# Patient Record
Sex: Female | Born: 1935 | Race: White | Hispanic: No | Marital: Married | Smoking: Never smoker
Health system: Southern US, Community
[De-identification: ages and names within clinical notes are randomized; demographics above are authoritative.]

## PROBLEM LIST (undated history)

## (undated) DIAGNOSIS — R011 Cardiac murmur, unspecified: Secondary | ICD-10-CM

## (undated) DIAGNOSIS — I129 Hypertensive chronic kidney disease with stage 1 through stage 4 chronic kidney disease, or unspecified chronic kidney disease: Secondary | ICD-10-CM

## (undated) DIAGNOSIS — Z9889 Other specified postprocedural states: Secondary | ICD-10-CM

## (undated) DIAGNOSIS — K5792 Diverticulitis of intestine, part unspecified, without perforation or abscess without bleeding: Secondary | ICD-10-CM

## (undated) DIAGNOSIS — N186 End stage renal disease: Secondary | ICD-10-CM

## (undated) DIAGNOSIS — N189 Chronic kidney disease, unspecified: Secondary | ICD-10-CM

## (undated) DIAGNOSIS — Z992 Dependence on renal dialysis: Secondary | ICD-10-CM

## (undated) DIAGNOSIS — M1990 Unspecified osteoarthritis, unspecified site: Secondary | ICD-10-CM

## (undated) DIAGNOSIS — E875 Hyperkalemia: Secondary | ICD-10-CM

## (undated) DIAGNOSIS — Q872 Congenital malformation syndromes predominantly involving limbs: Secondary | ICD-10-CM

## (undated) DIAGNOSIS — R112 Nausea with vomiting, unspecified: Secondary | ICD-10-CM

## (undated) DIAGNOSIS — I10 Essential (primary) hypertension: Secondary | ICD-10-CM

## (undated) DIAGNOSIS — H9190 Unspecified hearing loss, unspecified ear: Secondary | ICD-10-CM

## (undated) DIAGNOSIS — D631 Anemia in chronic kidney disease: Secondary | ICD-10-CM

## (undated) DIAGNOSIS — D1771 Benign lipomatous neoplasm of kidney: Secondary | ICD-10-CM

## (undated) DIAGNOSIS — K219 Gastro-esophageal reflux disease without esophagitis: Secondary | ICD-10-CM

## (undated) DIAGNOSIS — T4145XA Adverse effect of unspecified anesthetic, initial encounter: Secondary | ICD-10-CM

## (undated) DIAGNOSIS — R809 Proteinuria, unspecified: Secondary | ICD-10-CM

## (undated) DIAGNOSIS — IMO0001 Reserved for inherently not codable concepts without codable children: Secondary | ICD-10-CM

## (undated) DIAGNOSIS — E039 Hypothyroidism, unspecified: Secondary | ICD-10-CM

## (undated) HISTORY — DX: Proteinuria, unspecified: R80.9

## (undated) HISTORY — PX: ABDOMINAL HYSTERECTOMY: SHX81

## (undated) HISTORY — DX: Benign lipomatous neoplasm of kidney: D17.71

## (undated) HISTORY — DX: Other disorders of phosphorus metabolism: E83.39

## (undated) HISTORY — DX: Chronic kidney disease, unspecified: D63.1

## (undated) HISTORY — PX: CATARACT EXTRACTION W/ INTRAOCULAR LENS  IMPLANT, BILATERAL: SHX1307

## (undated) HISTORY — PX: COLONOSCOPY: SHX174

## (undated) HISTORY — DX: Chronic kidney disease, unspecified: N18.9

## (undated) HISTORY — PX: APPENDECTOMY: SHX54

## (undated) HISTORY — DX: Hyperkalemia: E87.5

## (undated) HISTORY — DX: Hypertensive chronic kidney disease with stage 1 through stage 4 chronic kidney disease, or unspecified chronic kidney disease: I12.9

## (undated) HISTORY — DX: Congenital malformation syndromes predominantly involving limbs: Q87.2

## (undated) HISTORY — PX: TONSILLECTOMY: SUR1361

---

## 1996-11-14 DIAGNOSIS — T8859XA Other complications of anesthesia, initial encounter: Secondary | ICD-10-CM

## 1996-11-14 HISTORY — DX: Other complications of anesthesia, initial encounter: T88.59XA

## 1996-11-14 HISTORY — PX: CHOLECYSTECTOMY: SHX55

## 2009-04-16 ENCOUNTER — Ambulatory Visit: Payer: Self-pay | Admitting: Urology

## 2009-05-01 ENCOUNTER — Ambulatory Visit: Payer: Self-pay | Admitting: Urology

## 2010-04-20 ENCOUNTER — Ambulatory Visit: Payer: Self-pay | Admitting: Urology

## 2010-05-06 ENCOUNTER — Ambulatory Visit: Payer: Self-pay | Admitting: General Practice

## 2013-01-20 DIAGNOSIS — Q872 Congenital malformation syndromes predominantly involving limbs: Secondary | ICD-10-CM | POA: Insufficient documentation

## 2015-02-04 DIAGNOSIS — E611 Iron deficiency: Secondary | ICD-10-CM | POA: Insufficient documentation

## 2015-02-26 DIAGNOSIS — Z01818 Encounter for other preprocedural examination: Secondary | ICD-10-CM | POA: Insufficient documentation

## 2015-08-06 ENCOUNTER — Encounter: Payer: Self-pay | Admitting: Vascular Surgery

## 2015-08-06 ENCOUNTER — Other Ambulatory Visit: Payer: Self-pay | Admitting: *Deleted

## 2015-08-06 DIAGNOSIS — N185 Chronic kidney disease, stage 5: Secondary | ICD-10-CM

## 2015-08-06 DIAGNOSIS — Z01810 Encounter for preprocedural cardiovascular examination: Secondary | ICD-10-CM

## 2015-08-07 ENCOUNTER — Ambulatory Visit (INDEPENDENT_AMBULATORY_CARE_PROVIDER_SITE_OTHER)
Admission: RE | Admit: 2015-08-07 | Discharge: 2015-08-07 | Disposition: A | Discharge status: Home / Self Care | Payer: PPO | Source: Ambulatory Visit | Attending: Vascular Surgery | Admitting: Vascular Surgery

## 2015-08-07 ENCOUNTER — Ambulatory Visit (HOSPITAL_COMMUNITY)
Admission: RE | Admit: 2015-08-07 | Discharge: 2015-08-07 | Disposition: A | Discharge status: Home / Self Care | Payer: PPO | Source: Ambulatory Visit | Attending: Vascular Surgery | Admitting: Vascular Surgery

## 2015-08-07 ENCOUNTER — Encounter: Payer: Self-pay | Admitting: Vascular Surgery

## 2015-08-07 ENCOUNTER — Other Ambulatory Visit: Payer: Self-pay

## 2015-08-07 ENCOUNTER — Encounter (HOSPITAL_COMMUNITY): Payer: Self-pay | Admitting: *Deleted

## 2015-08-07 ENCOUNTER — Ambulatory Visit (INDEPENDENT_AMBULATORY_CARE_PROVIDER_SITE_OTHER): Payer: PPO | Admitting: Vascular Surgery

## 2015-08-07 VITALS — BP 166/69 | HR 76 | Ht 60.0 in | Wt 118.0 lb

## 2015-08-07 DIAGNOSIS — N186 End stage renal disease: Secondary | ICD-10-CM | POA: Insufficient documentation

## 2015-08-07 DIAGNOSIS — Z01810 Encounter for preprocedural cardiovascular examination: Secondary | ICD-10-CM | POA: Diagnosis not present

## 2015-08-07 DIAGNOSIS — Z992 Dependence on renal dialysis: Secondary | ICD-10-CM

## 2015-08-07 DIAGNOSIS — N185 Chronic kidney disease, stage 5: Secondary | ICD-10-CM | POA: Insufficient documentation

## 2015-08-07 NOTE — Progress Notes (Signed)
Alexis Washington sounds winded as she talked on the phone.  I told her to stop anytime that it gets to be too much for her. Alexis Washington denies chest pain, does not see a cardiologist, stated that she may have had a ECHO at [place] years ago.  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  Alexis Washington reported that in 1998- she had cholesteatoma at [facility] and was told that her BP dropped low during surgery. After surgery ,blood pressure was elevated.  I requested records from [place].    Patient reports that she has not had an EKG in years and has not had a chest x-ray since surgery in 1998.

## 2015-08-07 NOTE — Progress Notes (Signed)
Referred by:  Dr. Justin Mend  Reason for referral: New access  History of Present Illness  Alexis Washington is a 81 y.o. (Mar 29, 1932) female who presents for evaluation for permanent access.  The patient is right hand dominant.  The patient has not had previous access procedures.  Previous central venous cannulation procedures include: none.  The patient has never had a PPM placed. The patient is now presenting with difficulty breathing and mildly altered mental status per family.  Dr. Justin Mend feels the patient now is entering ESRD and will need permanent access and [facility] placed.  He also gave her a Z-pack for possible pneumonia.  Past Medical History  Diagnosis Date  . Hyperkalemia   . Anemia in chronic renal disease   . Hypertensive kidney disease   . Chronic kidney disease, stage V   . Hyperphosphatemia   . Benign lipomatous neoplasm of kidney   . Nail-patella syndrome   . Proteinuria     Past Surgical History  Procedure Laterality Date  . Abdominal hysterectomy      Social History   Social History  . Marital Status: Married    Spouse Name: N/A  . Number of Children: N/A  . Years of Education: N/A   Occupational History  . Not on file.   Social History Main Topics  . Smoking status: Never Smoker   . Smokeless tobacco: Not on file  . Alcohol Use: No  . Drug Use: No  . Sexual Activity: Not on file   Other Topics Concern  . Not on file   Social History Narrative    Family History  Problem Relation Age of Onset  . Hypertension Mother   . Heart disease Father     before age 10  . Hyperlipidemia Father   . Hypertension Father   . Heart attack Father     Current Outpatient Prescriptions  Medication Sig Dispense Refill  . amLODipine (NORVASC) 2.5 MG tablet Take 2.5 mg by mouth daily.    . carvedilol (COREG) 12.5 MG tablet Take 12.5 mg by mouth 2 (two) times daily with a meal.    . Chlorpheniramine-Acetaminophen (CORICIDIN HBP COLD/FLU PO) Take by mouth daily as  needed.    . Cholecalciferol (VITAMIN D3 PO) Take 2,000 Units by mouth daily.    Marland Kitchen Epoetin Alfa (PROCRIT IJ) Inject as directed.    . furosemide (LASIX) 20 MG tablet Take 20 mg by mouth 2 (two) times daily.    Marland Kitchen levothyroxine (SYNTHROID, LEVOTHROID) 75 MCG tablet Take 75 mcg by mouth daily before breakfast.    . multivitamin (RENA-VIT) TABS tablet Take 6 tablets by mouth daily.    . ondansetron (ZOFRAN) 4 MG tablet Take 4 mg by mouth every 8 (eight) hours as needed for nausea or vomiting.    . pantoprazole (PROTONIX) 40 MG tablet Take 40 mg by mouth daily.    . Pepsin POWD by Does not apply route as needed.    Marland Kitchen azithromycin (ZITHROMAX) 250 MG tablet     . calcium acetate (PHOSLO) 667 MG capsule     . levothyroxine (SYNTHROID, LEVOTHROID) 100 MCG tablet     . PREMARIN vaginal cream     . PROCRIT 15400 UNIT/ML injection      No current facility-administered medications for this visit.    Allergies  Allergen Reactions  . Codeine Nausea Only  . Novocain [Procaine] Palpitations    REVIEW OF SYSTEMS:  (Positives checked otherwise negative)  CARDIOVASCULAR:   '[ ]'$   chest pain,  $Remo'[ ]'MVFeK$  chest pressure,  $RemoveBe'[ ]'zVtTVeShQ$  palpitations,  $RemoveBefore'[ ]'nktRUwCCpLPRc$  shortness of breath when laying flat,  $Remo'[x]'arxWT$  shortness of breath with exertion,   '[ ]'$  pain in feet when walking,  $RemoveB'[ ]'FFdgpfdB$  pain in feet when laying flat, $RemoveBefo'[ ]'PlmiPMeqgxt$  history of blood clot in veins (DVT),  $Remov'[ ]'TtFOxo$  history of phlebitis,  $RemoveBef'[x]'RVisEHCcTn$  swelling in legs,  $Remo'[ ]'wfSqA$  varicose veins  PULMONARY:   '[ ]'$  productive cough,  $Remov'[ ]'PrFNlk$  asthma,  $Remove'[ ]'XgiceUI$  wheezing  NEUROLOGIC:   '[x]'$  weakness in arms or legs,  $Remo'[ ]'YdjRJ$  numbness in arms or legs,  $Remo'[ ]'LYiwi$  difficulty speaking or slurred speech,  $Remove'[ ]'UZAWvHm$  temporary loss of vision in one eye,  $Rem'[x]'WIDI$  dizziness  HEMATOLOGIC:   '[ ]'$  bleeding problems,  $RemoveBe'[ ]'qJCBmFnUF$  problems with blood clotting too easily  MUSCULOSKEL:   '[ ]'$  joint pain, $RemoveBef'[ ]'IShnnKAajV$  joint swelling  GASTROINTEST:   '[ ]'$  vomiting blood,  $Remov'[ ]'irvzGa$  blood in stool     GENITOURINARY:   '[ ]'$  burning with urination,  $RemoveBef'[ ]'ZzaNBHyijU$  blood in  urine  PSYCHIATRIC:   '[ ]'$  history of major depression  INTEGUMENTARY:   '[ ]'$  rashes,  $Remove'[ ]'vzZVOLR$  ulcers  CONSTITUTIONAL:   '[ ]'$  fever,  $Remov'[ ]'PwDYEQ$  chills   Physical Examination  Filed Vitals:   08/07/15 1213 08/07/15 1222  BP: 220/63 166/69  Pulse: 76   Height: 5' (1.524 m)   Weight: 118 lb (53.524 kg)   SpO2: 100%    Body mass index is 23.05 kg/(m^2).  General: A&O x 3, WD, elderly, thin  Head: [place]/AT  Ear/Nose/Throat: Hearing grossly intact, nares w/o erythema or drainage, oropharynx w/o Erythema/Exudate, Mallampati score: 3  Eyes: PERRLA, EOMI  Neck: Supple, no nuchal rigidity, no palpable LAD  Pulmonary: Sym exp, good air movt, CTAB, no rales, rhonchi, & wheezing  Cardiac: RRR, Nl S1, S2, no Murmurs, rubs or gallops  Vascular: Vessel Right Left  Radial Palpable Palpable  Ulnar Not Palpable Not Palpable  Brachial  Palpable Palpable  Carotid Palpable, without bruit Palpable, without bruit  Aorta Not palpable N/A  Femoral Palpable Palpable  Popliteal Not palpable Not palpable  PT Palpable Palpable  DP Palpable Palpable   Gastrointestinal: soft, NTND, -G/R, - HSM, - masses, - CVAT B  Musculoskeletal: M/S 5/5 throughout , Extremities without ischemic changes , B elbow contractures limit extension to 90 degree  Neurologic: CN 2-12 intact , Pain and light touch intact in extremities , Motor exam as listed above  Psychiatric: Judgment intact, Mood & affect appropriate for pt's clinical situation  Dermatologic: See M/S exam for extremity exam, no rashes otherwise noted  Lymph : No Cervical, Axillary, or Inguinal lymphadenopathy    Non-Invasive Vascular Imaging  Vein Mapping  (Date: 08/07/2015):   R arm: acceptable vein conduits include marginal upper arm cephalic, marginal basilic vein  L arm: acceptable vein conduits include none  BUE Doppler (Date: 08/07/2015):   R arm:   Brachial: tri,4.0 mm  Radial: tri,1.5 mm  Ulnar: tri,1.6 mm  L arm:   Brachial:  tri,3.0 mm  Radial: tri,1.9 mm  Ulnar: tri,1.8 mm   Outside Studies/Documentation 6 pages of outside documents were reviewed including: outpatient nephrology chart and labs.   Medical Decision Making  Alexis Washington is a 81 y.o. female who presents with imminent ESRD requiring hemodialysis, recovering pneumonia   Based on vein mapping and examination, this patient's permanent access options include: R BC vs stage BVT vs AVG.  Her permanent access may be delayed by Anesthesia due to the pneumonia.  On exam, she her lungs sound clear today so clearly she is recovering.  I will place [name] on Monday regardless of the whether permanent access is permitted by Anesthesia.  I had an extensive discussion with this patient in regards to the nature of access surgery, including risk, benefits, and alternatives.    The patient is aware that the risks of access surgery include but are not limited to: bleeding, infection, steal syndrome, nerve damage, ischemic monomelic neuropathy, failure of access to mature, and possible need for additional access procedures in the future.  I discussed with the patient the nature of the staged access procedure, specifically the need for a second operation to transpose the first stage fistula if it matures adequately.   The patient is aware the risks of tunneled dialysis catheter placement include but are not limited to: bleeding, infection, central venous injury, pneumothorax, possible venous stenosis, possible malpositioning in the venous system, and possible infections related to long-term catheter presence.   The patient has agreed to proceed with the above procedure which will be scheduled this Monday 26 SEP 16.   Adele Barthel, MD [facility] Office: [phone] Pager: [phone]  08/07/2015, 12:59 PM

## 2015-08-07 NOTE — Progress Notes (Signed)
I informed Dr Marcie Bal of patients history and current ? Pneumonia.  Dr Marcie Bal said that they would evaluate patient on Monday.

## 2015-08-09 NOTE — Anesthesia Preprocedure Evaluation (Addendum)
Anesthesia Evaluation  Patient identified by MRN, date of birth, ID band Patient awake    Reviewed: Allergy & Precautions, NPO status , Patient's Chart, lab work & pertinent test results  History of Anesthesia Complications (+) PONV and history of anesthetic complications  Airway Mallampati: III  TM Distance: >3 FB Neck ROM: Full    Dental  (+) Dental Advisory Given   Pulmonary neg pulmonary ROS,    breath sounds clear to auscultation       Cardiovascular hypertension, Pt. on medications and Pt. on home beta blockers (-) angina Rhythm:Regular Rate:Normal     Neuro/Psych negative neurological ROS     GI/Hepatic Neg liver ROS, GERD  Medicated and Controlled,  Endo/Other  diabetes (glu 118)Hypothyroidism   Renal/GU ESRFRenal disease (K+ 4.4, no dialysis yet)     Musculoskeletal   Abdominal   Peds  Hematology  (+) Blood dyscrasia (Hb 8.8), ,   Anesthesia Other Findings   Reproductive/Obstetrics                            Anesthesia Physical Anesthesia Plan  ASA: III  Anesthesia Plan: MAC   Post-op Pain Management:    Induction: Intravenous  Airway Management Planned: Natural Airway and Simple Face Mask  Additional Equipment:   Intra-op Plan:   Post-operative Plan:   Informed Consent: I have reviewed the patients History and Physical, chart, labs and discussed the procedure including the risks, benefits and alternatives for the proposed anesthesia with the patient or authorized representative who has indicated his/her understanding and acceptance.   Dental advisory given  Plan Discussed with: CRNA and Surgeon  Anesthesia Plan Comments: (Plan routine monitors, MAC)        Anesthesia Quick Evaluation

## 2015-08-10 ENCOUNTER — Encounter (HOSPITAL_COMMUNITY): Payer: Self-pay | Admitting: *Deleted

## 2015-08-10 ENCOUNTER — Ambulatory Visit (HOSPITAL_COMMUNITY)
Admission: RE | Admit: 2015-08-10 | Discharge: 2015-08-10 | Disposition: A | Discharge status: Home / Self Care | Payer: PPO | Source: Ambulatory Visit | Attending: Vascular Surgery | Admitting: Vascular Surgery

## 2015-08-10 ENCOUNTER — Ambulatory Visit (HOSPITAL_COMMUNITY): Payer: PPO | Admitting: Anesthesiology

## 2015-08-10 ENCOUNTER — Ambulatory Visit (HOSPITAL_COMMUNITY): Payer: PPO

## 2015-08-10 ENCOUNTER — Encounter (HOSPITAL_COMMUNITY)
Admission: RE | Disposition: A | Discharge status: Home / Self Care | Payer: Self-pay | Source: Ambulatory Visit | Attending: Vascular Surgery

## 2015-08-10 DIAGNOSIS — E039 Hypothyroidism, unspecified: Secondary | ICD-10-CM | POA: Insufficient documentation

## 2015-08-10 DIAGNOSIS — Z992 Dependence on renal dialysis: Secondary | ICD-10-CM

## 2015-08-10 DIAGNOSIS — I120 Hypertensive chronic kidney disease with stage 5 chronic kidney disease or end stage renal disease: Secondary | ICD-10-CM | POA: Diagnosis not present

## 2015-08-10 DIAGNOSIS — Z79899 Other long term (current) drug therapy: Secondary | ICD-10-CM | POA: Insufficient documentation

## 2015-08-10 DIAGNOSIS — E1122 Type 2 diabetes mellitus with diabetic chronic kidney disease: Secondary | ICD-10-CM | POA: Diagnosis not present

## 2015-08-10 DIAGNOSIS — D631 Anemia in chronic kidney disease: Secondary | ICD-10-CM | POA: Insufficient documentation

## 2015-08-10 DIAGNOSIS — N185 Chronic kidney disease, stage 5: Secondary | ICD-10-CM | POA: Diagnosis not present

## 2015-08-10 DIAGNOSIS — N186 End stage renal disease: Secondary | ICD-10-CM | POA: Insufficient documentation

## 2015-08-10 DIAGNOSIS — Z95828 Presence of other vascular implants and grafts: Secondary | ICD-10-CM

## 2015-08-10 DIAGNOSIS — Z419 Encounter for procedure for purposes other than remedying health state, unspecified: Secondary | ICD-10-CM

## 2015-08-10 HISTORY — DX: Cardiac murmur, unspecified: R01.1

## 2015-08-10 HISTORY — PX: AV FISTULA PLACEMENT: SHX1204

## 2015-08-10 HISTORY — DX: Essential (primary) hypertension: I10

## 2015-08-10 HISTORY — DX: Reserved for inherently not codable concepts without codable children: IMO0001

## 2015-08-10 HISTORY — DX: Gastro-esophageal reflux disease without esophagitis: K21.9

## 2015-08-10 HISTORY — DX: Unspecified osteoarthritis, unspecified site: M19.90

## 2015-08-10 HISTORY — DX: Unspecified hearing loss, unspecified ear: H91.90

## 2015-08-10 HISTORY — DX: Hypothyroidism, unspecified: E03.9

## 2015-08-10 HISTORY — PX: FISTULA SUPERFICIALIZATION: SHX6341

## 2015-08-10 HISTORY — DX: Diverticulitis of intestine, part unspecified, without perforation or abscess without bleeding: K57.92

## 2015-08-10 HISTORY — DX: Adverse effect of unspecified anesthetic, initial encounter: T41.45XA

## 2015-08-10 HISTORY — PX: INSERTION OF DIALYSIS CATHETER: SHX1324

## 2015-08-10 LAB — POCT I-STAT 4, (NA,K, GLUC, HGB,HCT)
Glucose, Bld: 118 mg/dL — ABNORMAL HIGH (ref 65–99)
HEMATOCRIT: 26 % — AB (ref 36.0–46.0)
Hemoglobin: 8.8 g/dL — ABNORMAL LOW (ref 12.0–15.0)
Potassium: 4.4 mmol/L (ref 3.5–5.1)
SODIUM: 134 mmol/L — AB (ref 135–145)

## 2015-08-10 SURGERY — ARTERIOVENOUS (AV) FISTULA CREATION
Anesthesia: Monitor Anesthesia Care | Site: Neck | Laterality: Right

## 2015-08-10 MED ORDER — PROPOFOL 10 MG/ML IV BOLUS
INTRAVENOUS | Status: AC
  Filled 2015-08-10: qty 20

## 2015-08-10 MED ORDER — THROMBIN 20000 UNITS EX SOLR
CUTANEOUS | Status: AC
  Filled 2015-08-10: qty 20000

## 2015-08-10 MED ORDER — ONDANSETRON HCL 4 MG/2ML IJ SOLN
INTRAMUSCULAR | Status: AC
  Filled 2015-08-10: qty 2

## 2015-08-10 MED ORDER — CHLORHEXIDINE GLUCONATE CLOTH 2 % EX PADS: 6.0000 | MEDICATED_PAD | Freq: Once | CUTANEOUS | Status: DC

## 2015-08-10 MED ORDER — SODIUM CHLORIDE 0.9 % IV SOLN
INTRAVENOUS | Status: DC | PRN
  Administered 2015-08-10: 08:00:00
  Administered 2015-08-10: 500 mL

## 2015-08-10 MED ORDER — SODIUM CHLORIDE 0.9 % IV SOLN
INTRAVENOUS | Status: DC
  Administered 2015-08-10: 07:00:00 via INTRAVENOUS

## 2015-08-10 MED ORDER — HEPARIN SODIUM (PORCINE) 1000 UNIT/ML IJ SOLN
INTRAMUSCULAR | Status: DC | PRN
  Administered 2015-08-10: 1000 [IU]

## 2015-08-10 MED ORDER — 0.9 % SODIUM CHLORIDE (POUR BTL) OPTIME
TOPICAL | Status: DC | PRN
  Administered 2015-08-10: 1000 mL

## 2015-08-10 MED ORDER — LIDOCAINE HCL (PF) 1 % IJ SOLN
INTRAMUSCULAR | Status: DC | PRN
  Administered 2015-08-10 (×2): 30 mL

## 2015-08-10 MED ORDER — FENTANYL CITRATE (PF) 250 MCG/5ML IJ SOLN
INTRAMUSCULAR | Status: AC
  Filled 2015-08-10: qty 5

## 2015-08-10 MED ORDER — LIDOCAINE HCL (PF) 1 % IJ SOLN
INTRAMUSCULAR | Status: AC
  Filled 2015-08-10: qty 30

## 2015-08-10 MED ORDER — MIDAZOLAM HCL 2 MG/2ML IJ SOLN: 0.5000 mg | Freq: Once | INTRAMUSCULAR | Status: DC | PRN

## 2015-08-10 MED ORDER — MEPERIDINE HCL 25 MG/ML IJ SOLN: 6.2500 mg | INTRAMUSCULAR | Status: DC | PRN

## 2015-08-10 MED ORDER — ONDANSETRON HCL 4 MG/2ML IJ SOLN
INTRAMUSCULAR | Status: DC | PRN
  Administered 2015-08-10: 4 mg via INTRAVENOUS

## 2015-08-10 MED ORDER — FENTANYL CITRATE (PF) 100 MCG/2ML IJ SOLN
25.0000 ug | INTRAMUSCULAR | Status: DC | PRN
  Administered 2015-08-10: 25 ug via INTRAVENOUS

## 2015-08-10 MED ORDER — ONDANSETRON HCL 4 MG/2ML IJ SOLN
4.0000 mg | Freq: Once | INTRAMUSCULAR | Status: AC
  Administered 2015-08-10: 4 mg via INTRAVENOUS

## 2015-08-10 MED ORDER — PROPOFOL 10 MG/ML IV BOLUS
INTRAVENOUS | Status: DC | PRN
  Administered 2015-08-10 (×7): 20 mg via INTRAVENOUS

## 2015-08-10 MED ORDER — FENTANYL CITRATE (PF) 250 MCG/5ML IJ SOLN
INTRAMUSCULAR | Status: DC | PRN
  Administered 2015-08-10 (×3): 25 ug via INTRAVENOUS

## 2015-08-10 MED ORDER — CEFUROXIME SODIUM 1.5 G IJ SOLR
1.5000 g | INTRAMUSCULAR | Status: AC
  Administered 2015-08-10: 1.5 g via INTRAVENOUS
  Filled 2015-08-10: qty 1.5

## 2015-08-10 MED ORDER — LIDOCAINE-EPINEPHRINE (PF) 1 %-1:200000 IJ SOLN
INTRAMUSCULAR | Status: AC
  Filled 2015-08-10: qty 30

## 2015-08-10 MED ORDER — TRAMADOL HCL 50 MG PO TABS: 50.0000 mg | ORAL_TABLET | Freq: Four times a day (QID) | ORAL | Status: DC | PRN

## 2015-08-10 MED ORDER — HEPARIN SODIUM (PORCINE) 1000 UNIT/ML IJ SOLN
INTRAMUSCULAR | Status: AC
  Filled 2015-08-10: qty 1

## 2015-08-10 MED ORDER — PROMETHAZINE HCL 25 MG/ML IJ SOLN
6.2500 mg | INTRAMUSCULAR | Status: DC | PRN
  Administered 2015-08-10: 6.25 mg via INTRAVENOUS

## 2015-08-10 MED ORDER — THROMBIN 20000 UNITS EX SOLR
OROMUCOSAL | Status: DC | PRN
  Administered 2015-08-10: 20 mL via TOPICAL

## 2015-08-10 MED ORDER — PROMETHAZINE HCL 25 MG/ML IJ SOLN
INTRAMUSCULAR | Status: AC
  Administered 2015-08-10: 6.25 mg via INTRAVENOUS
  Filled 2015-08-10: qty 1

## 2015-08-10 MED ORDER — PROPOFOL 500 MG/50ML IV EMUL
INTRAVENOUS | Status: DC | PRN
  Administered 2015-08-10: 50 ug/kg/min via INTRAVENOUS

## 2015-08-10 MED ORDER — FENTANYL CITRATE (PF) 100 MCG/2ML IJ SOLN
INTRAMUSCULAR | Status: DC
Start: 2015-08-10 — End: 2015-08-10
  Filled 2015-08-10: qty 2

## 2015-08-10 SURGICAL SUPPLY — 64 items
ARMBAND PINK RESTRICT EXTREMIT (MISCELLANEOUS) ×4 IMPLANT
BAG DECANTER FOR FLEXI CONT (MISCELLANEOUS) ×4 IMPLANT
BIOPATCH RED 1 DISK 7.0 (GAUZE/BANDAGES/DRESSINGS) ×3 IMPLANT
BIOPATCH RED 1IN DISK 7.0MM (GAUZE/BANDAGES/DRESSINGS) ×1
CANISTER SUCTION 2500CC (MISCELLANEOUS) ×4 IMPLANT
CATH CANNON HEMO 15F 50CM (CATHETERS) IMPLANT
CATH CANNON HEMO 15FR 19 (HEMODIALYSIS SUPPLIES) IMPLANT
CATH CANNON HEMO 15FR 23CM (HEMODIALYSIS SUPPLIES) ×4 IMPLANT
CATH CANNON HEMO 15FR 31CM (HEMODIALYSIS SUPPLIES) IMPLANT
CATH CANNON HEMO 15FR 32CM (HEMODIALYSIS SUPPLIES) IMPLANT
CATH STRAIGHT 5FR 65CM (CATHETERS) IMPLANT
CLIP TI MEDIUM 6 (CLIP) ×4 IMPLANT
CLIP TI WIDE RED SMALL 6 (CLIP) ×4 IMPLANT
COVER PROBE W GEL 5X96 (DRAPES) ×8 IMPLANT
DECANTER SPIKE VIAL GLASS SM (MISCELLANEOUS) ×4 IMPLANT
DRAIN PENROSE 1/4X12 LTX STRL (WOUND CARE) ×4 IMPLANT
DRAPE C-ARM 42X72 X-RAY (DRAPES) ×4 IMPLANT
DRAPE CHEST BREAST 15X10 FENES (DRAPES) ×4 IMPLANT
ELECT REM PT RETURN 9FT ADLT (ELECTROSURGICAL) ×4
ELECTRODE REM PT RTRN 9FT ADLT (ELECTROSURGICAL) ×2 IMPLANT
GAUZE SPONGE 2X2 8PLY STRL LF (GAUZE/BANDAGES/DRESSINGS) ×2 IMPLANT
GAUZE SPONGE 4X4 16PLY XRAY LF (GAUZE/BANDAGES/DRESSINGS) ×4 IMPLANT
GLOVE BIO SURGEON STRL SZ 6.5 (GLOVE) ×3 IMPLANT
GLOVE BIO SURGEON STRL SZ7 (GLOVE) ×12 IMPLANT
GLOVE BIO SURGEON STRL SZ8.5 (GLOVE) ×4 IMPLANT
GLOVE BIO SURGEONS STRL SZ 6.5 (GLOVE) ×1
GLOVE BIOGEL PI IND STRL 6.5 (GLOVE) ×2 IMPLANT
GLOVE BIOGEL PI IND STRL 7.5 (GLOVE) ×2 IMPLANT
GLOVE BIOGEL PI INDICATOR 6.5 (GLOVE) ×2
GLOVE BIOGEL PI INDICATOR 7.5 (GLOVE) ×2
GLOVE ECLIPSE 7.5 STRL STRAW (GLOVE) ×12 IMPLANT
GOWN BRE IMP SLV AUR XL STRL (GOWN DISPOSABLE) ×8 IMPLANT
GOWN STRL REUS W/ TWL LRG LVL3 (GOWN DISPOSABLE) ×6 IMPLANT
GOWN STRL REUS W/TWL LRG LVL3 (GOWN DISPOSABLE) ×6
KIT BASIN OR (CUSTOM PROCEDURE TRAY) ×4 IMPLANT
KIT ROOM TURNOVER OR (KITS) ×4 IMPLANT
LIQUID BAND (GAUZE/BANDAGES/DRESSINGS) ×8 IMPLANT
NEEDLE 18GX1X1/2 (RX/OR ONLY) (NEEDLE) ×4 IMPLANT
NEEDLE HYPO 25GX1X1/2 BEV (NEEDLE) ×4 IMPLANT
NS IRRIG 1000ML POUR BTL (IV SOLUTION) ×4 IMPLANT
PACK CV ACCESS (CUSTOM PROCEDURE TRAY) ×4 IMPLANT
PACK SURGICAL SETUP 50X90 (CUSTOM PROCEDURE TRAY) IMPLANT
PAD ARMBOARD 7.5X6 YLW CONV (MISCELLANEOUS) ×8 IMPLANT
SET MICROPUNCTURE 5F STIFF (MISCELLANEOUS) ×4 IMPLANT
SOAP 2 % CHG 4 OZ (WOUND CARE) ×4 IMPLANT
SPONGE GAUZE 2X2 STER 10/PKG (GAUZE/BANDAGES/DRESSINGS) ×2
SPONGE SURGIFOAM ABS GEL 100 (HEMOSTASIS) ×4 IMPLANT
SUT ETHILON 3 0 PS 1 (SUTURE) ×4 IMPLANT
SUT MNCRL AB 4-0 PS2 18 (SUTURE) ×8 IMPLANT
SUT PROLENE 6 0 BV (SUTURE) IMPLANT
SUT PROLENE 7 0 BV 1 (SUTURE) ×12 IMPLANT
SUT SILK 4 0 (SUTURE) ×2
SUT SILK 4-0 18XBRD TIE 12 (SUTURE) ×2 IMPLANT
SUT VIC AB 3-0 SH 27 (SUTURE) ×2
SUT VIC AB 3-0 SH 27X BRD (SUTURE) ×2 IMPLANT
SYR 20CC LL (SYRINGE) ×8 IMPLANT
SYR 3ML LL SCALE MARK (SYRINGE) ×4 IMPLANT
SYR 5ML LL (SYRINGE) ×4 IMPLANT
SYR CONTROL 10ML LL (SYRINGE) ×4 IMPLANT
SYRINGE 10CC LL (SYRINGE) ×4 IMPLANT
TAPE CLOTH SURG 4X10 WHT LF (GAUZE/BANDAGES/DRESSINGS) ×4 IMPLANT
UNDERPAD 30X30 INCONTINENT (UNDERPADS AND DIAPERS) ×4 IMPLANT
WATER STERILE IRR 1000ML POUR (IV SOLUTION) ×4 IMPLANT
WIRE AMPLATZ SS-J .035X180CM (WIRE) IMPLANT

## 2015-08-10 NOTE — Anesthesia Postprocedure Evaluation (Signed)
  Anesthesia Post-op Note  Patient: Alexis Washington  Procedure(s) Performed: Procedure(s): Right Arm Arteriovenous Brachio-cephalic Fistula  (Right) INSERTION OF Right Internal Jugular DIALYSIS CATHETER (N/A) Right First Stage Brachial Transposition (Right)  Patient Location: PACU  Anesthesia Type:MAC  Level of Consciousness: awake, alert , oriented and patient cooperative  Airway and Oxygen Therapy: Patient Spontanous Breathing  Post-op Pain: none  Post-op Assessment: Post-op Vital signs reviewed, Patient's Cardiovascular Status Stable, Respiratory Function Stable, Patent Airway and Pain level controlled, nausea improved              Post-op Vital Signs: Reviewed and stable  Last Vitals:  Filed Vitals:   08/10/15 1125  BP: 178/68  Pulse: 70  Temp:   Resp: 17    Complications: No apparent anesthesia complications

## 2015-08-10 NOTE — Interval H&P Note (Signed)
History and Physical Interval Note:  08/10/2015 7:05 AM  Alexis Washington  has presented today for surgery, with the diagnosis of End Stage Renal Disease N18.6  The various methods of treatment have been discussed with the patient and family. After consideration of risks, benefits and other options for treatment, the patient has consented to  Procedure(s): ARTERIOVENOUS (AV) FISTULA CREATION VERSUS GRAFT INSERTION (Right) INSERTION OF DIALYSIS CATHETER (N/A) as a surgical intervention .  The patient's history has been reviewed, patient examined, no change in status, stable for surgery.  I have reviewed the patient's chart and labs.  Questions were answered to the patient's satisfaction.     Adele Barthel

## 2015-08-10 NOTE — Op Note (Addendum)
OPERATIVE NOTE  PROCEDURE: 1.  Right internal jugular vein tunneled dialysis catheter placement 2.  Right internal jugular vein cannulation under ultrasound guidance 3.  Right brachiocephalic arteriovenous fistula   4.  Right first stage brachial vein transposition   PRE-OPERATIVE DIAGNOSIS: end-stage renal failure  POST-OPERATIVE DIAGNOSIS: same as above  SURGEON: Adele Barthel, MD  ASSISTANT: Silva Bandy, PAC   ANESTHESIA: local and MAC  ESTIMATED BLOOD LOSS: 30 cc  FINDING(S): 1.  Tips of the catheter in the right atrium on fluoroscopy 2.  No obvious pneumothorax on fluoroscopy 3.  Failed brachiocephalic arteriovenous fistula due to proximal cephalic vein occlusion 4.  Palpable thrill in brachial vein transposition at end of case 5.  Palpable right radial pulse  SPECIMEN(S):  none  INDICATIONS:   Alexis Washington is a 81 y.o. female who presents with end stage renal disease.  The patient presents for tunneled dialysis catheter placement.  The patient is aware the risks of tunneled dialysis catheter placement include but are not limited to: bleeding, infection, central venous injury, pneumothorax, possible venous stenosis, possible malpositioning in the venous system, and possible infections related to long-term catheter presence.  The patient was aware of these risks and agreed to proceed.  DESCRIPTION: After written full informed consent was obtained from the patient, the patient was taken back to the operating room.  Prior to induction, the patient was given IV antibiotics.  After obtaining adequate sedation, the patient was prepped and draped in the standard fashion for a chest or neck tunneled dialysis catheter placement.   The cannulation site, the catheter exit site, and tract for the subcutaneous tunnel were then anesthestized with a total of 20 cc 1% Lidocaine without epinepherine.  Under ultrasound guidance, the right internal jugular vein was cannulated with the 18 gauge  needle.  A J-wire was then placed down into the inferior vena cava under fluoroscopic guidance.  The wire was then secured in place with a clamp to the drapes.  I then made stab incisions at the neck and exit sites.   I dissected from the exit site to the cannulation site with a metal tunneler.   The subcutaneous tunnel was dilated by passing a plastic dilator over the metal dissector. The wire was then unclamped and I removed the needle.  The skin tract and venotomy was dilated serially with dilators.  Finally, the dilator-sheath was placed under fluoroscopic guidance into the superior vena cava.  The dilator and wire were removed.  A 23 cm Diatek catheter was placed under fluoroscopic guidance down into the right atrium.  The sheath was broken and peeled away while holding the catheter cuff at the level of the skin.  The back end of this catheter was transected, revealing the two lumens of this catheter.  The ports were docked onto these two lumens.  The catheter hub was then screwed into place.  Each port was tested by aspirating and flushing.  No resistance was noted.  Each port was then thoroughly flushed with heparinized saline.  The catheter was secured in placed with two interrupted stitches of 3-0 Nylon tied to the catheter.  The neck incision was closed with a U-stitch of 4-0 Monocryl.  The neck and chest incision were cleaned and sterile bandages applied.  Each port was then loaded with concentrated heparin (1000 Units/mL) at the manufacturer recommended volumes to each port.  Sterile caps were applied to each port.  On completion fluoroscopy, the tips of the catheter were  in the right atrium, and there was no evidence of pneumothorax.  At this point, the drapes were taken down.  The patient was repositioned for a right arm access procedure and reprepped and redraped.   The patient is chronic contracted, so I had to pad the arm to limit the tension on her elbow.  I turned my attention first to  identifying the patient's cephalic vein and brachial artery.  Using SonoSite guidance, the location of these vessels were marked out on the skin.   At this point, I injected local anesthetic to obtain a field block of the antecubitum.  I injected another 5 mL of a 1% lidocaine without epinephrine.  I made a transverse incision at the level of the antecubitum and dissected through the subcutaneous tissue and fascia to gain exposure of the brachial artery.  This was noted to be 2.5 mm in diameter externally.  This was dissected out proximally and distally and controlled with vessel loops .  I then dissected out the cephalic vein.  This was noted to be 3 mm in diameter externally.  The distal segment of the vein was ligated with a  2-0 silk, and the vein was transected.  The proximal segment was interrogated with serial dilators.  The vein accepted up to a 4 mm dilator without any difficulty.  I then instilled the heparinized saline into the vein and clamped it.  At this point, I reset my exposure of the brachial artery and placed the artery under tension proximally and distally.  I made an arteriotomy with a #11 blade, and then I extended the arteriotomy with a Potts scissor.  I injected heparinized saline proximal and distal to this arteriotomy.  The vein was then sewn to the artery in an end-to-side configuration with a running stitch of 7-0 Prolene.  Prior to completing this anastomosis, I allowed the vein and artery to backbleed.  There was no evidence of clot from any vessels.  I completed the anastomosis in the usual fashion and then released all vessel loops and clamps.  There was no palpable  thrill in the venous outflow rather there was a strong pulse.  Doppler signals confirmed this findings, suggesting this cephalic vein is occluded proximally.  At felt that attempting another fistula was indicated given this patient's end stage renal status.  I turned my attention first to identifying the patient's  brachial veins.  I then dissected out the medial brachial vein.  This was noted to be 3 mm in diameter externally.  The distal segment of the vein had multiple side branches.  I carefully ligated multiple side branches until I had a dominant single vein.  The distal vein was ligated with a  2-0 silk, and the vein was transected.  The proximal segment was interrogated with serial dilators.  The vein accepted up to a 3.5 mm dilator without any difficulty.  I then instilled the heparinized saline into the vein and clamped it.  At this point, I reset my exposure of the brachial artery and placed the artery under tension proximally and distally.  The artery was noted to be fragile from the brachiocephalic arteriovenous fistula anastomosis, so I felt taking down the prior anastomosis might result in irreparable damage to the artery.  I tied off the distal cephalic vein and then cut the fistula ~5 mm distal to the anastomosis, leaving a vein cuff on the prior anastomosis.  The brachial vein was then sewn to the vein cuff on the brachial  artery in an end-to-end configuration with a running stitch of 7-0 Prolene.  Prior to completing this anastomosis, I allowed the vein and artery to backbleed.  There was no evidence of clot from any vessels.  I completed the anastomosis in the usual fashion and then released all vessel loops and clamps.  There was a palpable thrill in the venous outflow, and there was a palpable radial pulse.  At this point, I irrigated out the surgical wound.  There was no further active bleeding.  The subcutaneous tissue was reapproximated with a running stitch of 3-0 Vicryl.  The skin was then reapproximated with a running subcuticular stitch of 4-0 Vicryl.  The skin was then cleaned, dried, and reinforced with Dermabond.  The patient tolerated this procedure well.    COMPLICATIONS: none  CONDITION: stable   Adele Barthel, MD [facility] [place] Office: [phone] Pager:  [phone]  08/10/2015, 8:14 AM

## 2015-08-10 NOTE — Progress Notes (Signed)
Pt arrived to phase 2 and started to feel nauseated and began dry heaving.  Dr.Jackson notified and  Ok with giving second dose of Zofran, will cont to assess patients nausea level.

## 2015-08-10 NOTE — H&P (View-Only) (Signed)
Referred by:  Dr. Justin Mend  Reason for referral: New access  History of Present Illness  Alexis Washington is a 81 y.o. (02/03/1932) female who presents for evaluation for permanent access.  The patient is right hand dominant.  The patient has not had previous access procedures.  Previous central venous cannulation procedures include: none.  The patient has never had a PPM placed. The patient is now presenting with difficulty breathing and mildly altered mental status per family.  Dr. Justin Mend feels the patient now is entering ESRD and will need permanent access and [facility] placed.  He also gave her a Z-pack for possible pneumonia.  Past Medical History  Diagnosis Date  . Hyperkalemia   . Anemia in chronic renal disease   . Hypertensive kidney disease   . Chronic kidney disease, stage V   . Hyperphosphatemia   . Benign lipomatous neoplasm of kidney   . Nail-patella syndrome   . Proteinuria     Past Surgical History  Procedure Laterality Date  . Abdominal hysterectomy      Social History   Social History  . Marital Status: Married    Spouse Name: N/A  . Number of Children: N/A  . Years of Education: N/A   Occupational History  . Not on file.   Social History Main Topics  . Smoking status: Never Smoker   . Smokeless tobacco: Not on file  . Alcohol Use: No  . Drug Use: No  . Sexual Activity: Not on file   Other Topics Concern  . Not on file   Social History Narrative    Family History  Problem Relation Age of Onset  . Hypertension Mother   . Heart disease Father     before age 95  . Hyperlipidemia Father   . Hypertension Father   . Heart attack Father     Current Outpatient Prescriptions  Medication Sig Dispense Refill  . amLODipine (NORVASC) 2.5 MG tablet Take 2.5 mg by mouth daily.    . carvedilol (COREG) 12.5 MG tablet Take 12.5 mg by mouth 2 (two) times daily with a meal.    . Chlorpheniramine-Acetaminophen (CORICIDIN HBP COLD/FLU PO) Take by mouth daily as  needed.    . Cholecalciferol (VITAMIN D3 PO) Take 2,000 Units by mouth daily.    Marland Kitchen Epoetin Alfa (PROCRIT IJ) Inject as directed.    . furosemide (LASIX) 20 MG tablet Take 20 mg by mouth 2 (two) times daily.    Marland Kitchen levothyroxine (SYNTHROID, LEVOTHROID) 75 MCG tablet Take 75 mcg by mouth daily before breakfast.    . multivitamin (RENA-VIT) TABS tablet Take 6 tablets by mouth daily.    . ondansetron (ZOFRAN) 4 MG tablet Take 4 mg by mouth every 8 (eight) hours as needed for nausea or vomiting.    . pantoprazole (PROTONIX) 40 MG tablet Take 40 mg by mouth daily.    . Pepsin POWD by Does not apply route as needed.    Marland Kitchen azithromycin (ZITHROMAX) 250 MG tablet     . calcium acetate (PHOSLO) 667 MG capsule     . levothyroxine (SYNTHROID, LEVOTHROID) 100 MCG tablet     . PREMARIN vaginal cream     . PROCRIT 59935 UNIT/ML injection      No current facility-administered medications for this visit.    Allergies  Allergen Reactions  . Codeine Nausea Only  . Novocain [Procaine] Palpitations    REVIEW OF SYSTEMS:  (Positives checked otherwise negative)  CARDIOVASCULAR:   '[ ]'$   chest pain,  $Remo'[ ]'WkICG$  chest pressure,  $RemoveBe'[ ]'xdobRSrrs$  palpitations,  $RemoveBefore'[ ]'EqgQeiMQwmwGU$  shortness of breath when laying flat,  $Remo'[x]'UFykE$  shortness of breath with exertion,   '[ ]'$  pain in feet when walking,  $RemoveB'[ ]'TgYMHdaV$  pain in feet when laying flat, $RemoveBefo'[ ]'WFcFjDAniXn$  history of blood clot in veins (DVT),  $Remov'[ ]'Vcdfrd$  history of phlebitis,  $RemoveBef'[x]'QVsGgLyjeJ$  swelling in legs,  $Remo'[ ]'Gtnuj$  varicose veins  PULMONARY:   '[ ]'$  productive cough,  $Remov'[ ]'tkmPlK$  asthma,  $Remove'[ ]'ysbwelD$  wheezing  NEUROLOGIC:   '[x]'$  weakness in arms or legs,  $Remo'[ ]'xTHtI$  numbness in arms or legs,  $Remo'[ ]'VKoFG$  difficulty speaking or slurred speech,  $Remove'[ ]'qtMUuAw$  temporary loss of vision in one eye,  $Rem'[x]'TGgF$  dizziness  HEMATOLOGIC:   '[ ]'$  bleeding problems,  $RemoveBe'[ ]'JdEfEgSap$  problems with blood clotting too easily  MUSCULOSKEL:   '[ ]'$  joint pain, $RemoveBef'[ ]'wZDdvkfYiE$  joint swelling  GASTROINTEST:   '[ ]'$  vomiting blood,  $Remov'[ ]'WuDHYj$  blood in stool     GENITOURINARY:   '[ ]'$  burning with urination,  $RemoveBef'[ ]'spsMFrhxWS$  blood in  urine  PSYCHIATRIC:   '[ ]'$  history of major depression  INTEGUMENTARY:   '[ ]'$  rashes,  $Remove'[ ]'peBVBCM$  ulcers  CONSTITUTIONAL:   '[ ]'$  fever,  $Remov'[ ]'pQRmxP$  chills   Physical Examination  Filed Vitals:   08/07/15 1213 08/07/15 1222  BP: 220/63 166/69  Pulse: 76   Height: 5' (1.524 m)   Weight: 118 lb (53.524 kg)   SpO2: 100%    Body mass index is 23.05 kg/(m^2).  General: A&O x 3, WD, elderly, thin  Head: [place]/AT  Ear/Nose/Throat: Hearing grossly intact, nares w/o erythema or drainage, oropharynx w/o Erythema/Exudate, Mallampati score: 3  Eyes: PERRLA, EOMI  Neck: Supple, no nuchal rigidity, no palpable LAD  Pulmonary: Sym exp, good air movt, CTAB, no rales, rhonchi, & wheezing  Cardiac: RRR, Nl S1, S2, no Murmurs, rubs or gallops  Vascular: Vessel Right Left  Radial Palpable Palpable  Ulnar Not Palpable Not Palpable  Brachial  Palpable Palpable  Carotid Palpable, without bruit Palpable, without bruit  Aorta Not palpable N/A  Femoral Palpable Palpable  Popliteal Not palpable Not palpable  PT Palpable Palpable  DP Palpable Palpable   Gastrointestinal: soft, NTND, -G/R, - HSM, - masses, - CVAT B  Musculoskeletal: M/S 5/5 throughout , Extremities without ischemic changes , B elbow contractures limit extension to 90 degree  Neurologic: CN 2-12 intact , Pain and light touch intact in extremities , Motor exam as listed above  Psychiatric: Judgment intact, Mood & affect appropriate for pt's clinical situation  Dermatologic: See M/S exam for extremity exam, no rashes otherwise noted  Lymph : No Cervical, Axillary, or Inguinal lymphadenopathy    Non-Invasive Vascular Imaging  Vein Mapping  (Date: 08/07/2015):   R arm: acceptable vein conduits include marginal upper arm cephalic, marginal basilic vein  L arm: acceptable vein conduits include none  BUE Doppler (Date: 08/07/2015):   R arm:   Brachial: tri,4.0 mm  Radial: tri,1.5 mm  Ulnar: tri,1.6 mm  L arm:   Brachial:  tri,3.0 mm  Radial: tri,1.9 mm  Ulnar: tri,1.8 mm   Outside Studies/Documentation 6 pages of outside documents were reviewed including: outpatient nephrology chart and labs.   Medical Decision Making  Alexis Washington is a 81 y.o. female who presents with imminent ESRD requiring hemodialysis, recovering pneumonia   Based on vein mapping and examination, this patient's permanent access options include: R BC vs stage BVT vs AVG.  Her permanent access may be delayed by Anesthesia due to the pneumonia.  On exam, she her lungs sound clear today so clearly she is recovering.  I will place [facility] on Monday regardless of the whether permanent access is permitted by Anesthesia.  I had an extensive discussion with this patient in regards to the nature of access surgery, including risk, benefits, and alternatives.    The patient is aware that the risks of access surgery include but are not limited to: bleeding, infection, steal syndrome, nerve damage, ischemic monomelic neuropathy, failure of access to mature, and possible need for additional access procedures in the future.  I discussed with the patient the nature of the staged access procedure, specifically the need for a second operation to transpose the first stage fistula if it matures adequately.   The patient is aware the risks of tunneled dialysis catheter placement include but are not limited to: bleeding, infection, central venous injury, pneumothorax, possible venous stenosis, possible malpositioning in the venous system, and possible infections related to long-term catheter presence.   The patient has agreed to proceed with the above procedure which will be scheduled this Monday 26 SEP 16.   Adele Barthel, MD [facility] [place] Office: [phone] Pager: [phone]  08/07/2015, 12:59 PM

## 2015-08-10 NOTE — Transfer of Care (Signed)
Immediate Anesthesia Transfer of Care Note  Patient: Alexis Washington  Procedure(s) Performed: Procedure(s): Right Arm Arteriovenous Brachio-cephalic Fistula  (Right) INSERTION OF Right Internal Jugular DIALYSIS CATHETER (N/A) Right First Stage Brachial Transposition (Right)  Patient Location: PACU  Anesthesia Type:MAC  Level of Consciousness: awake, alert  and oriented  Airway & Oxygen Therapy: Patient Spontanous Breathing and Patient connected to nasal cannula oxygen  Post-op Assessment: Report given to RN and Post -op Vital signs reviewed and stable  Post vital signs: Reviewed and stable  Last Vitals:  Filed Vitals:   08/10/15 1026  BP: 147/66  Pulse:   Temp: 36.4 C  Resp:     Complications: No apparent anesthesia complications

## 2015-08-10 NOTE — Discharge Instructions (Signed)
° ° °  08/10/2015 Alexis Washington [id number] 1932/01/24  Surgeon(s): Conrad [place], MD  Procedure(s): Right Arm Arteriovenous Brachio-cephalic Fistula  INSERTION OF Right Internal Jugular DIALYSIS CATHETER Right First Stage Brachial Transposition  x Do not stick graft for 12 weeks

## 2015-08-11 ENCOUNTER — Encounter (HOSPITAL_COMMUNITY): Payer: Self-pay | Admitting: Vascular Surgery

## 2015-08-12 ENCOUNTER — Telehealth: Payer: Self-pay | Admitting: Vascular Surgery

## 2015-08-12 NOTE — Telephone Encounter (Signed)
-----   Message from Mena Goes, RN sent at 08/10/2015 10:21 AM EDT ----- Regarding: Schedule   ----- Message -----    From: Alvia Grove, PA-C    Sent: 08/10/2015  10:19 AM      To: Vvs Charge Pool  S/p right brachicephalic AVF and right 1st stage Brachial vein transposition 08/10/15  F/u in 6 weeks with Dr. Bridgett Larsson. No duplex studies.   Thanks Maudie Mercury

## 2015-08-12 NOTE — Telephone Encounter (Signed)
Spoke wth pt, dpm

## 2015-09-09 ENCOUNTER — Encounter: Payer: Self-pay | Admitting: Nephrology

## 2015-09-22 ENCOUNTER — Encounter: Payer: Self-pay | Admitting: Vascular Surgery

## 2015-09-25 ENCOUNTER — Ambulatory Visit (INDEPENDENT_AMBULATORY_CARE_PROVIDER_SITE_OTHER): Payer: PPO | Admitting: Vascular Surgery

## 2015-09-25 ENCOUNTER — Encounter: Payer: Self-pay | Admitting: Vascular Surgery

## 2015-09-25 VITALS — BP 175/74 | HR 65 | Temp 98.0°F | Resp 16 | Ht 60.0 in | Wt 121.0 lb

## 2015-09-25 DIAGNOSIS — Z992 Dependence on renal dialysis: Secondary | ICD-10-CM

## 2015-09-25 DIAGNOSIS — N186 End stage renal disease: Secondary | ICD-10-CM

## 2015-09-25 NOTE — Progress Notes (Signed)
    Postoperative Access Visit   History of Present Illness  Alexis Washington is a 81 y.o. year old female who presents for postoperative follow-up for: RIJV TDC, R BC AVF, R 1st stage BRVT (Date: 08/10/15).  At the end of the [facility] AVF, there was only a pulse in the fistula, suggesting proximal cephalic vein occlusion.  The fistula was takedown and immediately a R 1st stage BRVT completed.  The patient's wounds are healed.  The patient notes no steal symptoms.  The patient is able to complete their activities of daily living.  The patient's current symptoms are: none.  For VQI Use Only  PRE-ADM LIVING: Home  AMB STATUS: Ambulatory  Physical Examination Filed Vitals:   09/25/15 1130  BP: 175/74  Pulse: 65  Temp:   Resp:     RUE: Incision is healed, skin feels warm, hand grip is 5/5, sensation in digits is intact, palpable thrill, bruit can be auscultated, on Sonosite: distally ~5-5.5 mm, proximal >6 mm, bilateral elbow contractures noted  Medical Decision Making  Alexis Washington is a 81 y.o. year old female who presents s/p RIJV TDC, R BC AVF ligation.  I would give the patient another month.  I suspect the fistula will be ready for transposition in one month.  She will follow up with me one month.  Thank you for allowing [place] to participate in this patient's care.  Adele Barthel, MD [facility] [place] Office: [phone] Pager: [phone]  09/25/2015, 12:05 PM

## 2015-10-16 ENCOUNTER — Encounter: Payer: Self-pay | Admitting: Vascular Surgery

## 2015-10-23 ENCOUNTER — Encounter: Payer: Self-pay | Admitting: Vascular Surgery

## 2015-10-23 ENCOUNTER — Ambulatory Visit: Payer: PPO | Admitting: Vascular Surgery

## 2015-10-30 ENCOUNTER — Ambulatory Visit (INDEPENDENT_AMBULATORY_CARE_PROVIDER_SITE_OTHER): Payer: PPO | Admitting: Vascular Surgery

## 2015-10-30 ENCOUNTER — Encounter: Payer: Self-pay | Admitting: Vascular Surgery

## 2015-10-30 VITALS — BP 155/66 | HR 87 | Temp 97.3°F | Resp 16 | Ht 60.0 in | Wt 120.0 lb

## 2015-10-30 DIAGNOSIS — N186 End stage renal disease: Secondary | ICD-10-CM

## 2015-10-30 DIAGNOSIS — Z992 Dependence on renal dialysis: Secondary | ICD-10-CM

## 2015-10-30 NOTE — Progress Notes (Signed)
Postoperative Access Visit   History of Present Illness  Alexis Washington is a 81 y.o. (Jul 06, 1932) female   who presents for postoperative follow-up for: RIJV TDC, R BC AVF, R 1st stage BRVT (Date: 08/10/15). Pt has been asx since the surgery.  She return for check on the fistula  Past Medical History  Diagnosis Date  . Hyperkalemia   . Hyperphosphatemia   . Nail-patella syndrome   . Proteinuria   . Hypertension   . Shortness of breath dyspnea   . Heart murmur     PCP- said it is nothing to be concerned  . Hypothyroidism   . Anemia in chronic renal disease     ESRD- Sees Dr Justin Mend  . Hypertensive kidney disease   . Chronic kidney disease, stage V ([place])   . Benign lipomatous neoplasm of kidney   . GERD (gastroesophageal reflux disease)   . Diverticulitis   . Complication of anesthesia 1998    "blood pressure dropped low during surgery and after surgery it went up really high."   . Arthritis   . HOH (hard of hearing)     Past Surgical History  Procedure Laterality Date  . Abdominal hysterectomy    . Cholecystectomy  1998  . Colonoscopy    . Av fistula placement Right 08/10/2015    Procedure: Right Arm Arteriovenous Brachio-cephalic Fistula ;  Surgeon: Conrad Collins, MD;  Location: [place];  Service: Vascular;  Laterality: Right;  . Insertion of dialysis catheter N/A 08/10/2015    Procedure: INSERTION OF Right Internal Jugular DIALYSIS CATHETER;  Surgeon: Conrad [place], MD;  Location: [facility] OR;  Service: Vascular;  Laterality: N/A;  . Fistula superficialization Right 08/10/2015    Procedure: Right First Stage Brachial Transposition;  Surgeon: Conrad [place], MD;  Location: [place];  Service: Vascular;  Laterality: Right;    Social History   Social History  . Marital Status: Married    Spouse Name: N/A  . Number of Children: N/A  . Years of Education: N/A   Occupational History  . Not on file.   Social History Main Topics  . Smoking status: Never Smoker   . Smokeless tobacco:  Never Used  . Alcohol Use: No  . Drug Use: No  . Sexual Activity: Not on file   Other Topics Concern  . Not on file   Social History Narrative    Family History  Problem Relation Age of Onset  . Hypertension Mother   . Heart disease Father     before age 29  . Hyperlipidemia Father   . Hypertension Father   . Heart attack Father     Current Outpatient Prescriptions  Medication Sig Dispense Refill  . acetaminophen (TYLENOL) 500 MG tablet Take 500 mg by mouth every 6 (six) hours as needed (pain).    Marland Kitchen amLODipine (NORVASC) 2.5 MG tablet Take 2.5 mg by mouth at bedtime.     . carvedilol (COREG) 12.5 MG tablet Take 12.5 mg by mouth 2 (two) times daily with a meal.    . Chlorpheniramine-Acetaminophen (CORICIDIN HBP COLD/FLU PO) Take 1 tablet by mouth 2 (two) times daily as needed (allergies).     . Cholecalciferol (VITAMIN D) 2000 UNITS tablet Take 2,000 Units by mouth daily.    Marland Kitchen conjugated estrogens (PREMARIN) vaginal cream Place 1 Applicatorful vaginally 2 (two) times a week. Wednesday and Sunday    . Epoetin Alfa (PROCRIT IJ) Inject as directed once a week. On Fridays    .  furosemide (LASIX) 20 MG tablet Take 40 mg by mouth 2 (two) times daily.     Marland Kitchen levothyroxine (SYNTHROID, LEVOTHROID) 100 MCG tablet     . levothyroxine (SYNTHROID, LEVOTHROID) 75 MCG tablet Take 75 mcg by mouth daily before breakfast.    . multivitamin (RENA-VIT) TABS tablet Take 2 tablets by mouth 3 (three) times daily.     . ondansetron (ZOFRAN) 4 MG tablet Take 4 mg by mouth every 8 (eight) hours as needed for nausea or vomiting.    Marland Kitchen OVER THE COUNTER MEDICATION Place 1 drop into both eyes daily as needed (allergy symptoms). Allergy Relief eye drops    . OVER THE COUNTER MEDICATION Take 30 mLs by mouth daily. Sea Silver    . pantoprazole (PROTONIX) 40 MG tablet Take 40 mg by mouth daily.    Marland Kitchen azithromycin (ZITHROMAX) 250 MG tablet Take 250-500 mg by mouth daily. Reported on 10/30/2015    . NITROSTAT 0.4 MG  SL tablet     . polyethylene glycol powder (GLYCOLAX/MIRALAX) powder     . traMADol (ULTRAM) 50 MG tablet Take 1 tablet (50 mg total) by mouth every 6 (six) hours as needed for moderate pain. (Patient not taking: Reported on 09/25/2015) 20 tablet 0   No current facility-administered medications for this visit.     Allergies  Allergen Reactions  . Codeine Nausea And Vomiting  . Novocain [Procaine] Palpitations     REVIEW OF SYSTEMS:  (Positives checked otherwise negative)  CARDIOVASCULAR:   '[ ]'$  chest pain,  $Remo'[ ]'KFzGh$  chest pressure,  $RemoveBe'[ ]'mkWOcDWDr$  palpitations,  $RemoveBefore'[ ]'uGqcVTLYPZUVq$  shortness of breath when laying flat,  $Remo'[ ]'KkIhP$  shortness of breath with exertion,   '[ ]'$  pain in feet when walking,  $RemoveB'[ ]'wEBQuuSZ$  pain in feet when laying flat, $RemoveBefo'[ ]'gpfNwXfcYeZ$  history of blood clot in veins (DVT),  $Remov'[ ]'sonYCi$  history of phlebitis,  $RemoveBef'[ ]'KQUwNUWFbG$  swelling in legs,  $Remo'[ ]'dPmdS$  varicose veins  PULMONARY:   '[ ]'$  productive cough,  $Remov'[ ]'dPFXEj$  asthma,  $Remove'[ ]'OjwiEYx$  wheezing  NEUROLOGIC:   '[ ]'$  weakness in arms or legs,  $Remo'[ ]'UTfjd$  numbness in arms or legs,  $Remo'[ ]'JgVOH$  difficulty speaking or slurred speech,  $Remove'[ ]'NkheWfJ$  temporary loss of vision in one eye,  $Rem'[ ]'ssiD$  dizziness  HEMATOLOGIC:   '[ ]'$  bleeding problems,  $RemoveBe'[ ]'GhNMDZLYK$  problems with blood clotting too easily  MUSCULOSKEL:   '[ ]'$  joint pain, $RemoveBef'[ ]'IRsEFQSoPH$  joint swelling  GASTROINTEST:   '[ ]'$  vomiting blood,  $Remov'[ ]'vIWmUt$  blood in stool     GENITOURINARY:   '[ ]'$  burning with urination,  $RemoveBef'[ ]'JpjmRlIFzi$  blood in urine $Remove'[x]'qFGOtZI$  ESRD-HD: M-W-F  PSYCHIATRIC:   '[ ]'$  history of major depression  INTEGUMENTARY:   '[ ]'$  rashes,  $Remove'[ ]'Uhdvycg$  ulcers  CONSTITUTIONAL:   '[ ]'$  fever,  $Remov'[ ]'SnbURb$  chills    For VQI Use Only  PRE-ADM LIVING: Home  AMB STATUS: Ambulatory  Physical Examination Filed Vitals:   10/30/15 1449 10/30/15 1453  BP: 169/69 155/66  Pulse: 91 87  Temp: 97.3 F (36.3 C)   Resp: 16   Height: 5' (1.524 m)   Weight: 120 lb (54.432 kg)   SpO2: 99%    Pulmonary: Sym exp, good air movt, CTAB, no rales, rhonchi, & wheezing  Cardiac: RRR, Nl S1, S2, no Murmurs, rubs or  gallops  RUE: Incision is healed, skin feels warm, hand grip is 5/5, sensation in digits is intact, palpable thrill, bruit can be auscultated, on Sonosite: distally ~5-5.5  mm, proximal >6 mm, bilateral elbow contractures noted  Medical Decision Making  Shawntee Mainwaring is a 81 y.o. ([birth year]) female  who presents s/p RIJV TDC, R BC AVF ligation, R 1st stage BRVT  The fistula hasn't grown any bigger.  Would go ahead and proceed with the transposition.    She is scheduled for 10 JAN 16. Risk, benefits, and alternatives to access surgery were discussed.  The patient is aware the risks include but are not limited to: bleeding, infection, steal syndrome, nerve damage, ischemic monomelic neuropathy, failure to mature, need for additional procedures, death and stroke.   The patient agrees to proceed forward with the procedure.  Thank you for allowing [place] to participate in this patient's care.  Adele Barthel, MD [facility] [place] Office: [phone] Pager: [phone]  10/30/2015, 3:19 PM

## 2015-11-02 ENCOUNTER — Other Ambulatory Visit: Payer: Self-pay

## 2015-11-16 DIAGNOSIS — E876 Hypokalemia: Secondary | ICD-10-CM | POA: Diagnosis not present

## 2015-11-16 DIAGNOSIS — N186 End stage renal disease: Secondary | ICD-10-CM | POA: Diagnosis not present

## 2015-11-16 DIAGNOSIS — D509 Iron deficiency anemia, unspecified: Secondary | ICD-10-CM | POA: Diagnosis not present

## 2015-11-16 DIAGNOSIS — D631 Anemia in chronic kidney disease: Secondary | ICD-10-CM | POA: Diagnosis not present

## 2015-11-16 DIAGNOSIS — D689 Coagulation defect, unspecified: Secondary | ICD-10-CM | POA: Diagnosis not present

## 2015-11-24 DIAGNOSIS — R079 Chest pain, unspecified: Secondary | ICD-10-CM | POA: Diagnosis not present

## 2015-11-24 DIAGNOSIS — R0789 Other chest pain: Secondary | ICD-10-CM | POA: Diagnosis not present

## 2015-11-24 DIAGNOSIS — Z992 Dependence on renal dialysis: Secondary | ICD-10-CM | POA: Diagnosis not present

## 2015-11-24 DIAGNOSIS — N186 End stage renal disease: Secondary | ICD-10-CM | POA: Diagnosis not present

## 2015-11-24 DIAGNOSIS — I120 Hypertensive chronic kidney disease with stage 5 chronic kidney disease or end stage renal disease: Secondary | ICD-10-CM | POA: Diagnosis not present

## 2015-11-26 DIAGNOSIS — I10 Essential (primary) hypertension: Secondary | ICD-10-CM | POA: Diagnosis not present

## 2015-11-26 DIAGNOSIS — N186 End stage renal disease: Secondary | ICD-10-CM | POA: Diagnosis not present

## 2015-11-26 DIAGNOSIS — R002 Palpitations: Secondary | ICD-10-CM | POA: Diagnosis not present

## 2015-11-26 DIAGNOSIS — D631 Anemia in chronic kidney disease: Secondary | ICD-10-CM | POA: Diagnosis not present

## 2015-11-26 DIAGNOSIS — Q798 Other congenital malformations of musculoskeletal system: Secondary | ICD-10-CM | POA: Diagnosis not present

## 2015-11-26 DIAGNOSIS — W19XXXA Unspecified fall, initial encounter: Secondary | ICD-10-CM | POA: Diagnosis not present

## 2015-11-27 ENCOUNTER — Encounter (HOSPITAL_COMMUNITY): Payer: Self-pay

## 2015-11-27 ENCOUNTER — Emergency Department (HOSPITAL_COMMUNITY): Payer: PPO

## 2015-11-27 ENCOUNTER — Emergency Department (HOSPITAL_COMMUNITY)
Admission: EM | Admit: 2015-11-27 | Discharge: 2015-11-27 | Disposition: A | Discharge status: Home / Self Care | Payer: PPO | Attending: Emergency Medicine | Admitting: Emergency Medicine

## 2015-11-27 DIAGNOSIS — M7989 Other specified soft tissue disorders: Secondary | ICD-10-CM | POA: Insufficient documentation

## 2015-11-27 DIAGNOSIS — M1990 Unspecified osteoarthritis, unspecified site: Secondary | ICD-10-CM | POA: Insufficient documentation

## 2015-11-27 DIAGNOSIS — R011 Cardiac murmur, unspecified: Secondary | ICD-10-CM | POA: Diagnosis not present

## 2015-11-27 DIAGNOSIS — N186 End stage renal disease: Secondary | ICD-10-CM | POA: Diagnosis not present

## 2015-11-27 DIAGNOSIS — I120 Hypertensive chronic kidney disease with stage 5 chronic kidney disease or end stage renal disease: Secondary | ICD-10-CM | POA: Insufficient documentation

## 2015-11-27 DIAGNOSIS — R002 Palpitations: Secondary | ICD-10-CM | POA: Diagnosis not present

## 2015-11-27 DIAGNOSIS — H9190 Unspecified hearing loss, unspecified ear: Secondary | ICD-10-CM | POA: Diagnosis not present

## 2015-11-27 DIAGNOSIS — E039 Hypothyroidism, unspecified: Secondary | ICD-10-CM | POA: Insufficient documentation

## 2015-11-27 DIAGNOSIS — Z79899 Other long term (current) drug therapy: Secondary | ICD-10-CM | POA: Insufficient documentation

## 2015-11-27 DIAGNOSIS — Z86018 Personal history of other benign neoplasm: Secondary | ICD-10-CM | POA: Diagnosis not present

## 2015-11-27 DIAGNOSIS — D631 Anemia in chronic kidney disease: Secondary | ICD-10-CM | POA: Insufficient documentation

## 2015-11-27 DIAGNOSIS — K219 Gastro-esophageal reflux disease without esophagitis: Secondary | ICD-10-CM | POA: Insufficient documentation

## 2015-11-27 DIAGNOSIS — R0602 Shortness of breath: Secondary | ICD-10-CM | POA: Insufficient documentation

## 2015-11-27 DIAGNOSIS — Z992 Dependence on renal dialysis: Secondary | ICD-10-CM | POA: Diagnosis not present

## 2015-11-27 DIAGNOSIS — Q872 Congenital malformation syndromes predominantly involving limbs: Secondary | ICD-10-CM | POA: Insufficient documentation

## 2015-11-27 LAB — BASIC METABOLIC PANEL
ANION GAP: 10 (ref 5–15)
BUN: 8 mg/dL (ref 6–20)
CO2: 28 mmol/L (ref 22–32)
CREATININE: 2.31 mg/dL — AB (ref 0.44–1.00)
Calcium: 9.1 mg/dL (ref 8.9–10.3)
Chloride: 99 mmol/L — ABNORMAL LOW (ref 101–111)
GFR calc Af Amer: 22 mL/min — ABNORMAL LOW (ref >60)
GFR, EST NON AFRICAN AMERICAN: 19 mL/min — AB (ref >60)
Glucose, Bld: 147 mg/dL — ABNORMAL HIGH (ref 65–99)
POTASSIUM: 2.9 mmol/L — AB (ref 3.5–5.1)
Sodium: 137 mmol/L (ref 135–145)

## 2015-11-27 LAB — CBC
HEMATOCRIT: 33.4 % — AB (ref 36.0–46.0)
Hemoglobin: 11 g/dL — ABNORMAL LOW (ref 12.0–15.0)
MCH: 26.8 pg (ref 26.0–34.0)
MCHC: 32.9 g/dL (ref 30.0–36.0)
MCV: 81.5 fL (ref 78.0–100.0)
Platelets: 120 10*3/uL — ABNORMAL LOW (ref 150–400)
RBC: 4.1 MIL/uL (ref 3.87–5.11)
RDW: 14.1 % (ref 11.5–15.5)
WBC: 2.4 10*3/uL — AB (ref 4.0–10.5)

## 2015-11-27 LAB — I-STAT TROPONIN, ED: Troponin i, poc: 0.02 ng/mL (ref 0.00–0.08)

## 2015-11-27 NOTE — ED Notes (Signed)
Patient here following dialysis with feeling "like my heart is out of rhythm". Denies CP, did finish her entire dialysis treatment pta.

## 2015-11-27 NOTE — ED Notes (Signed)
Pt states sx have resolved and she currently has no complaints.

## 2015-11-27 NOTE — ED Provider Notes (Signed)
CSN: [id number]     Arrival date & time 11/27/15  1313 History   First MD Initiated Contact with Patient 11/27/15 1626     Chief Complaint  Patient presents with  . Palpitations     (Consider location/radiation/quality/duration/timing/severity/associated sxs/prior Treatment) HPI  82 year old female with history of end-stage renal disease on dialysis, hypertension, who presents to the emergency room today with approximately 30 minutes palpitations while getting dialysis. Patient reports that the nurse checked her pulse rate during that time and said that it was irregular. Resolved after eating. She did not have any worsening shortness of breath, chest pain, dizziness, vision changes during that time. She's had this episode one time before also associated with dialysis. No other associated symptoms. Did" dialysis. Asymptomatic at this time aside from her baseline source of breath.  Past Medical History  Diagnosis Date  . Hyperkalemia   . Hyperphosphatemia   . Nail-patella syndrome   . Proteinuria   . Hypertension   . Shortness of breath dyspnea   . Heart murmur     PCP- said it is nothing to be concerned  . Hypothyroidism   . Anemia in chronic renal disease     ESRD- Sees Dr Justin Mend  . Hypertensive kidney disease   . Chronic kidney disease, stage V ([place])   . Benign lipomatous neoplasm of kidney   . GERD (gastroesophageal reflux disease)   . Diverticulitis   . Complication of anesthesia 1998    "blood pressure dropped low during surgery and after surgery it went up really high."   . Arthritis   . HOH (hard of hearing)    Past Surgical History  Procedure Laterality Date  . Abdominal hysterectomy    . Cholecystectomy  1998  . Colonoscopy    . Av fistula placement Right 08/10/2015    Procedure: Right Arm Arteriovenous Brachio-cephalic Fistula ;  Surgeon: Conrad Granite Quarry, MD;  Location: [facility];  Service: Vascular;  Laterality: Right;  . Insertion of dialysis catheter N/A 08/10/2015     Procedure: INSERTION OF Right Internal Jugular DIALYSIS CATHETER;  Surgeon: Conrad Cabot, MD;  Location: [facility] OR;  Service: Vascular;  Laterality: N/A;  . Fistula superficialization Right 08/10/2015    Procedure: Right First Stage Brachial Transposition;  Surgeon: Conrad Ozora, MD;  Location: [facility];  Service: Vascular;  Laterality: Right;   Family History  Problem Relation Age of Onset  . Hypertension Mother   . Heart disease Father     before age 2  . Hyperlipidemia Father   . Hypertension Father   . Heart attack Father    Social History  Substance Use Topics  . Smoking status: Never Smoker   . Smokeless tobacco: Never Used  . Alcohol Use: No   OB History    No data available     Review of Systems  Constitutional: Negative for fever, chills and appetite change.  HENT: Negative for congestion and ear pain.   Respiratory: Positive for shortness of breath.   Cardiovascular: Positive for palpitations and leg swelling. Negative for chest pain.  Genitourinary: Negative for dysuria.  All other systems reviewed and are negative.     Allergies  Codeine and Novocain  Home Medications   Prior to Admission medications   Medication Sig Start Date End Date Taking? Authorizing Provider  acetaminophen (TYLENOL) 500 MG tablet Take 500 mg by mouth every 6 (six) hours as needed (pain).    Historical Provider, MD  amLODipine (NORVASC) 2.5 MG tablet Take  2.5 mg by mouth at bedtime.     Historical Provider, MD  Calcium-Vitamin D-Vitamin K (VIACTIV PO) Take 1 Dose by mouth daily.    Historical Provider, MD  carvedilol (COREG) 12.5 MG tablet Take 12.5 mg by mouth 2 (two) times daily with a meal.    Historical Provider, MD  Chlorpheniramine-Acetaminophen (CORICIDIN HBP COLD/FLU PO) Take 1 tablet by mouth 2 (two) times daily as needed (allergies).     Historical Provider, MD  Cholecalciferol (VITAMIN D) 2000 UNITS tablet Take 2,000 Units by mouth daily.    Historical Provider, MD  conjugated  estrogens (PREMARIN) vaginal cream Place 1 Applicatorful vaginally 2 (two) times a week. Wednesday and Sunday    Historical Provider, MD  Epoetin Alfa (PROCRIT IJ) Inject as directed once a week. On Fridays    Historical Provider, MD  furosemide (LASIX) 20 MG tablet Take 40 mg by mouth 2 (two) times daily.     Historical Provider, MD  levothyroxine (SYNTHROID, LEVOTHROID) 100 MCG tablet Take 100 mcg by mouth daily before breakfast.  09/23/15   Historical Provider, MD  multivitamin (RENA-VIT) TABS tablet Take 2 tablets by mouth 3 (three) times daily.     Historical Provider, MD  NITROSTAT 0.4 MG SL tablet Place 0.4 mg under the tongue every 5 (five) minutes as needed for chest pain.  10/23/15   Historical Provider, MD  ondansetron (ZOFRAN) 4 MG tablet Take 4 mg by mouth every 8 (eight) hours as needed for nausea or vomiting.    Historical Provider, MD  OVER THE COUNTER MEDICATION Place 1 drop into both eyes daily as needed (allergy symptoms). Allergy Relief eye drops    Historical Provider, MD  OVER THE COUNTER MEDICATION Take 30 mLs by mouth daily. Sea Silver    Historical Provider, MD  pantoprazole (PROTONIX) 40 MG tablet Take 40 mg by mouth daily.    Historical Provider, MD   BP 152/72 mmHg  Pulse 80  Temp(Src) 97.7 F (36.5 C) (Oral)  Resp 20  SpO2 95% Physical Exam  Constitutional: She is oriented to person, place, and time. She appears well-developed and well-nourished.  HENT:  Head: Normocephalic and atraumatic.  Eyes: Pupils are equal, round, and reactive to light.  Neck: Normal range of motion.  Cardiovascular: Normal rate and regular rhythm.   Murmur heard. Pulmonary/Chest: Effort normal and breath sounds normal. No stridor. No respiratory distress. She has no rales.  Abdominal: Soft. She exhibits no distension.  Musculoskeletal: She exhibits edema. She exhibits no tenderness.  Neurological: She is alert and oriented to person, place, and time.  Skin: Skin is warm and dry. No rash  noted. No erythema.  Nursing note and vitals reviewed.   ED Course  Procedures (including critical care time) Labs Review Labs Reviewed  BASIC METABOLIC PANEL - Abnormal; Notable for the following:    Potassium 2.9 (*)    Chloride 99 (*)    Glucose, Bld 147 (*)    Creatinine, Ser 2.31 (*)    GFR calc non Af Amer 19 (*)    GFR calc Af Amer 22 (*)    All other components within normal limits  CBC - Abnormal; Notable for the following:    WBC 2.4 (*)    Hemoglobin 11.0 (*)    HCT 33.4 (*)    Platelets 120 (*)    All other components within normal limits  Randolm Idol, ED    Imaging Review Dg Chest 2 View  11/27/2015  CLINICAL DATA:  Fall  3 days ago.  Palpitations. EXAM: CHEST  2 VIEW COMPARISON:  08/10/2015 FINDINGS: Right IJ central venous catheter with tip over the right atrium unchanged. EKG lead projected over the left upper lung. Lungs are somewhat hypoinflated with evidence of a small left effusion loculated slightly laterally and anteriorly and may be new or slightly worse compared to the previous exam. Remainder of the lungs are clear. Cardiomediastinal silhouette and remainder of the exam is unchanged. No definite left rib fracture. IMPRESSION: Small left effusion anterior laterally new or slightly worse compared to the prior study. Right IJ central venous catheter unchanged. Electronically Signed   By: Marin Olp M.D.   On: 11/27/2015 13:53   I have personally reviewed and evaluated these images and lab results as part of my medical decision-making.   EKG Interpretation   Date/Time:  Friday November 27 2015 13:14:11 EST Ventricular Rate:  81 PR Interval:  178 QRS Duration: 76 QT Interval:  386 QTC Calculation: 448 R Axis:   16 Text Interpretation:  Normal sinus rhythm Possible Left atrial enlargement  Possible Anterior infarct , age undetermined Abnormal ECG Confirmed by  [facility] MD, Corene Cornea [phone]) on 11/27/2015 4:26:03 PM      MDM   Final diagnoses:   Palpitations    A 59-year-old female here with palpitations of uncertain etiology. Had episode one time previously and more monitor with that did not give her any results. I suspect possible paroxysmal atrial fibrillation as she says her heart rate was irregular during that time. She dirty on a beta blocker and she is now in normal sinus rhythm. I hesitate starting an 82 year old female on any type of anticoagulants so we'll defer to her primary physician to do such. She also has shortness of breath but this sounds like her baseline she been worked up with echocardiogram stress test and multiple other tests for the patient without any resolution. She can continue workup with her primary doctor for that as well.    Merrily Pew, MD 11/30/15 [phone]

## 2015-11-30 MED ORDER — DEXTROSE 5 % IV SOLN: 1.5000 g | INTRAVENOUS | Status: AC

## 2015-11-30 MED ORDER — SODIUM CHLORIDE 0.9 % IV SOLN: INTRAVENOUS | Status: DC

## 2015-11-30 MED ORDER — CHLORHEXIDINE GLUCONATE CLOTH 2 % EX PADS: 6.0000 | MEDICATED_PAD | Freq: Once | CUTANEOUS | Status: DC

## 2015-11-30 NOTE — Progress Notes (Signed)
Anesthesia Chart Review:  Pt is 82 year old female scheduled for second stage R brachial vein transposition on 12/01/2015 with Dr. Bridgett Larsson.   Pt is a same day work up. HTN, heart murmur, ESRD on HD, Nail Patella Syndrome, hypothyroidism, anemia, GERD. Hard of hearing. Never smoker. BMI 23   Pt was seen in the ED 11/27/15 for palpitations during dialysis. ED provider note has not yet been published.   Medications include: amlodipine, carvedilol, epoetin, lasix, levothyroxine, protonix.   Labs from ED visit 11/27/15 reviewed. K 2.9. Will recheck DOS.   Chest x-ray 11/27/15 reviewed. Small left effusion anterior laterally new or slightly worse compared to the prior study. Right IJ central venous catheter unchanged.  EKG 11/27/15: NSR. Possible LAE. Possible Anterior infarct, age undetermined  Holter monitor 09/17/15 (care everywhere):  1. Ventricular: rare PVCs, one ventricular couplet, one ventricle triplet. 2. Supraventricular: rare PACs, one period of supraventricular tachycardia. 3. Bradyarrhythmias and Pauses: No Symptoms reported: None CONCLUSIONS: 1. Abnormal Holter monitor.  2. Arrhythmia as described 3. Symptoms were not reported  4. Symptoms were not correlated with arrhythmias  Echo 06/19/14 (correspondence 08/12/15 in media tab):  1. LV systolic function hyperdynamic, EF 70%. Diastolic filling pattern indicates impaired relaxation.  2. LA mildly dilated 3. Mild aortic valve sclerosis 4. Trace to mild MR 5. Trace TR 6. RV systolic pressure is 29 mm Hg.  7. Trivial pericardial effusion present.   If no changes, I anticipate pt can proceed with surgery as scheduled.   Willeen Cass, FNP-BC [facility]/Anesthesiology Phone: [phone] 11/30/2015 4:52 PM

## 2015-12-01 DIAGNOSIS — E1122 Type 2 diabetes mellitus with diabetic chronic kidney disease: Secondary | ICD-10-CM | POA: Diagnosis not present

## 2015-12-01 DIAGNOSIS — N186 End stage renal disease: Secondary | ICD-10-CM | POA: Diagnosis not present

## 2015-12-01 DIAGNOSIS — D1771 Benign lipomatous neoplasm of kidney: Secondary | ICD-10-CM | POA: Diagnosis not present

## 2015-12-01 DIAGNOSIS — Z992 Dependence on renal dialysis: Secondary | ICD-10-CM | POA: Diagnosis not present

## 2015-12-01 DIAGNOSIS — E039 Hypothyroidism, unspecified: Secondary | ICD-10-CM | POA: Diagnosis not present

## 2015-12-01 DIAGNOSIS — I120 Hypertensive chronic kidney disease with stage 5 chronic kidney disease or end stage renal disease: Secondary | ICD-10-CM | POA: Diagnosis not present

## 2015-12-10 ENCOUNTER — Encounter (HOSPITAL_COMMUNITY): Payer: Self-pay | Admitting: *Deleted

## 2015-12-10 NOTE — Progress Notes (Signed)
Pt denies any acute cardiopulmonary issues. Pt is under the care of Dr. Geraldo Pitter, Cardiology, at [facility], [place]. Pt stated that a stress test was completed in December 2016; records requested and received. Pt made aware to stop otc vitamins, fish oil, herbal medications and NSAID's. Pt verbalized understanding of all pre-op instructions. Pt chart forwarded to anesthesia to review cardiac clearance note on chart.

## 2015-12-11 NOTE — Progress Notes (Signed)
Anesthesia Chart Review:  Pt is an 82 year old female scheduled for second stage R brachial vein transposition on 12/15/2015 with Dr. Bridgett Larsson.   Pt is a same day work up.   Cardiologist is Dr. Jyl Heinz (care everywhere), who has cleared pt for surgery.   PMH includes:  HTN, heart murmur, ESRD on HD, Nail Patella Syndrome, hypothyroidism, anemia, GERD. Hard of hearing. Never smoker. BMI 23   Medications include: amlodipine, carvedilol, epoetin, lasix, levothyroxine, protonix.   Labs will be obtained DOS.   Chest x-ray 11/27/15 reviewed. Small left effusion anterior laterally new or slightly worse compared to the prior study. Right IJ central venous catheter unchanged.  EKG 11/27/15: NSR. Possible LAE. Possible Anterior infarct, age undetermined  Echo 11/24/15:  1. LV is normal in size. Mild concentric LVH. Normal global wall motion. Visual EF 55-60%. Doppler evidence of grade I (impaired) diastolic dysfunction.  2. LA mildly dilated 3. Native trileaflet aortic valve with no regurgitation. Moderate calcification of the aortic valve annulus. Moderate aortic valve leaflet thickening with sclerosis. Normal aortic valve leaflet mobility. No evidence aortic valve stenosis 4. Mild MR. Mild calcification of mitral valve annulus. No mitral valve stenosis 5. Mild TR 6. Trace PR 7. Insignificant pericardial effusion  Nuclear stress test 10/20/15 [facility]):  1. No evidence ischemia 2. EF 70%  Holter monitor 09/17/15 (care everywhere):  1. Ventricular: rare PVCs, one ventricular couplet, one ventricle triplet. 2. Supraventricular: rare PACs, one period of supraventricular tachycardia. 3. Bradyarrhythmias and Pauses: No Symptoms reported: None CONCLUSIONS: 1. Abnormal Holter monitor.  2. Arrhythmia as described 3. Symptoms were not reported  4. Symptoms were not correlated with arrhythmias  If no changes, I anticipate pt can proceed with surgery as scheduled.   Alexis Cass,  FNP-BC [facility]/Anesthesiology Phone: [phone] 12/11/2015 12:19 PM

## 2015-12-14 NOTE — Anesthesia Preprocedure Evaluation (Addendum)
Anesthesia Evaluation  Patient identified by MRN, date of birth, ID band Patient awake    Reviewed: Allergy & Precautions, NPO status , Patient's Chart, lab work & pertinent test results  History of Anesthesia Complications (+) PONV and history of anesthetic complications  Airway Mallampati: III  TM Distance: >3 FB Neck ROM: Full   Comment: Recessed chin Dental  (+) Teeth Intact, Dental Advisory Given   Pulmonary neg pulmonary ROS,    Pulmonary exam normal breath sounds clear to auscultation       Cardiovascular hypertension, Pt. on medications and Pt. on home beta blockers (-) anginaNormal cardiovascular exam     Neuro/Psych negative neurological ROS     GI/Hepatic Neg liver ROS, GERD  Medicated and Controlled,  Endo/Other  diabetes (glu 118)Hypothyroidism   Renal/GU ESRFRenal disease (K+ 4.4, no dialysis yet)     Musculoskeletal   Abdominal   Peds  Hematology  (+) Blood dyscrasia (Hb 8.8), ,   Anesthesia Other Findings   Reproductive/Obstetrics                           Anesthesia Physical  Anesthesia Plan  ASA: III  Anesthesia Plan: MAC and General   Post-op Pain Management:    Induction: Intravenous  Airway Management Planned: Natural Airway, Simple Face Mask and LMA  Additional Equipment:   Intra-op Plan:   Post-operative Plan: Extubation in OR  Informed Consent: I have reviewed the patients History and Physical, chart, labs and discussed the procedure including the risks, benefits and alternatives for the proposed anesthesia with the patient or authorized representative who has indicated his/her understanding and acceptance.   Dental advisory given  Plan Discussed with: CRNA, Anesthesiologist and Surgeon  Anesthesia Plan Comments:        Anesthesia Quick Evaluation

## 2015-12-15 ENCOUNTER — Encounter (HOSPITAL_COMMUNITY)
Admission: RE | Disposition: A | Discharge status: Home / Self Care | Payer: PPO | Source: Ambulatory Visit | Attending: Vascular Surgery

## 2015-12-15 ENCOUNTER — Encounter (HOSPITAL_COMMUNITY): Payer: Self-pay | Admitting: *Deleted

## 2015-12-15 ENCOUNTER — Ambulatory Visit (HOSPITAL_COMMUNITY)
Admission: RE | Admit: 2015-12-15 | Discharge: 2015-12-15 | Disposition: A | Discharge status: Home / Self Care | Payer: PPO | Source: Ambulatory Visit | Attending: Vascular Surgery | Admitting: Vascular Surgery

## 2015-12-15 ENCOUNTER — Ambulatory Visit (HOSPITAL_COMMUNITY): Payer: PPO | Admitting: Emergency Medicine

## 2015-12-15 DIAGNOSIS — E1122 Type 2 diabetes mellitus with diabetic chronic kidney disease: Secondary | ICD-10-CM | POA: Diagnosis not present

## 2015-12-15 DIAGNOSIS — N186 End stage renal disease: Secondary | ICD-10-CM | POA: Diagnosis not present

## 2015-12-15 DIAGNOSIS — K219 Gastro-esophageal reflux disease without esophagitis: Secondary | ICD-10-CM | POA: Diagnosis not present

## 2015-12-15 DIAGNOSIS — N185 Chronic kidney disease, stage 5: Secondary | ICD-10-CM | POA: Diagnosis not present

## 2015-12-15 DIAGNOSIS — E039 Hypothyroidism, unspecified: Secondary | ICD-10-CM | POA: Insufficient documentation

## 2015-12-15 DIAGNOSIS — Z992 Dependence on renal dialysis: Secondary | ICD-10-CM | POA: Diagnosis not present

## 2015-12-15 DIAGNOSIS — N269 Renal sclerosis, unspecified: Secondary | ICD-10-CM | POA: Diagnosis not present

## 2015-12-15 DIAGNOSIS — I120 Hypertensive chronic kidney disease with stage 5 chronic kidney disease or end stage renal disease: Secondary | ICD-10-CM | POA: Insufficient documentation

## 2015-12-15 HISTORY — PX: BASCILIC VEIN TRANSPOSITION: SHX5742

## 2015-12-15 HISTORY — DX: Nausea with vomiting, unspecified: R11.2

## 2015-12-15 HISTORY — DX: Other specified postprocedural states: Z98.890

## 2015-12-15 LAB — POCT I-STAT 4, (NA,K, GLUC, HGB,HCT)
GLUCOSE: 110 mg/dL — AB (ref 65–99)
HEMATOCRIT: 37 % (ref 36.0–46.0)
HEMOGLOBIN: 12.6 g/dL (ref 12.0–15.0)
Potassium: 4.4 mmol/L (ref 3.5–5.1)
Sodium: 132 mmol/L — ABNORMAL LOW (ref 135–145)

## 2015-12-15 SURGERY — TRANSPOSITION, VEIN, BASILIC
Anesthesia: General | Site: Arm Upper | Laterality: Right

## 2015-12-15 MED ORDER — SODIUM CHLORIDE 0.9 % IV SOLN
INTRAVENOUS | Status: DC | PRN
  Administered 2015-12-15: 500 mL

## 2015-12-15 MED ORDER — LIDOCAINE HCL (PF) 1 % IJ SOLN
INTRAMUSCULAR | Status: AC
  Filled 2015-12-15: qty 30

## 2015-12-15 MED ORDER — EPHEDRINE SULFATE 50 MG/ML IJ SOLN
INTRAMUSCULAR | Status: AC
  Filled 2015-12-15: qty 1

## 2015-12-15 MED ORDER — EPHEDRINE SULFATE 50 MG/ML IJ SOLN
INTRAMUSCULAR | Status: DC | PRN
  Administered 2015-12-15: 10 mg via INTRAVENOUS
  Administered 2015-12-15 (×2): 5 mg via INTRAVENOUS
  Administered 2015-12-15: 10 mg via INTRAVENOUS

## 2015-12-15 MED ORDER — ROCURONIUM BROMIDE 50 MG/5ML IV SOLN
INTRAVENOUS | Status: AC
  Filled 2015-12-15: qty 2

## 2015-12-15 MED ORDER — ONDANSETRON HCL 4 MG/2ML IJ SOLN
INTRAMUSCULAR | Status: DC | PRN
  Administered 2015-12-15: 4 mg via INTRAVENOUS

## 2015-12-15 MED ORDER — MIDAZOLAM HCL 2 MG/2ML IJ SOLN
INTRAMUSCULAR | Status: AC
  Filled 2015-12-15: qty 2

## 2015-12-15 MED ORDER — HYDROMORPHONE HCL 1 MG/ML IJ SOLN
INTRAMUSCULAR | Status: AC
  Administered 2015-12-15: 0.25 mg via INTRAVENOUS
  Filled 2015-12-15: qty 1

## 2015-12-15 MED ORDER — PROPOFOL 10 MG/ML IV BOLUS
INTRAVENOUS | Status: AC
  Filled 2015-12-15: qty 20

## 2015-12-15 MED ORDER — PROMETHAZINE HCL 25 MG/ML IJ SOLN
INTRAMUSCULAR | Status: AC
  Filled 2015-12-15: qty 1

## 2015-12-15 MED ORDER — PHENYLEPHRINE HCL 10 MG/ML IJ SOLN
10.0000 mg | INTRAVENOUS | Status: DC | PRN
  Administered 2015-12-15: 30 ug/min via INTRAVENOUS

## 2015-12-15 MED ORDER — SODIUM CHLORIDE 0.9 % IV SOLN
INTRAVENOUS | Status: DC | PRN
  Administered 2015-12-15: 07:00:00 via INTRAVENOUS

## 2015-12-15 MED ORDER — HEMOSTATIC AGENTS (NO CHARGE) OPTIME
TOPICAL | Status: DC | PRN
  Administered 2015-12-15: 2 via TOPICAL

## 2015-12-15 MED ORDER — 0.9 % SODIUM CHLORIDE (POUR BTL) OPTIME
TOPICAL | Status: DC | PRN
  Administered 2015-12-15: 1000 mL

## 2015-12-15 MED ORDER — HYDROMORPHONE HCL 1 MG/ML IJ SOLN
0.2500 mg | INTRAMUSCULAR | Status: DC | PRN
  Administered 2015-12-15 (×3): 0.25 mg via INTRAVENOUS

## 2015-12-15 MED ORDER — LIDOCAINE HCL (CARDIAC) 20 MG/ML IV SOLN
INTRAVENOUS | Status: AC
  Filled 2015-12-15: qty 5

## 2015-12-15 MED ORDER — SODIUM CHLORIDE 0.9 % IJ SOLN
INTRAMUSCULAR | Status: AC
  Filled 2015-12-15: qty 10

## 2015-12-15 MED ORDER — CEFAZOLIN SODIUM-DEXTROSE 2-3 GM-% IV SOLR
INTRAVENOUS | Status: DC | PRN
Start: 2015-12-15 — End: 2015-12-15
  Administered 2015-12-15: 2 g via INTRAVENOUS

## 2015-12-15 MED ORDER — FENTANYL CITRATE (PF) 100 MCG/2ML IJ SOLN
INTRAMUSCULAR | Status: DC | PRN
  Administered 2015-12-15 (×4): 25 ug via INTRAVENOUS

## 2015-12-15 MED ORDER — OXYCODONE-ACETAMINOPHEN 5-325 MG PO TABS: 1.0000 | ORAL_TABLET | ORAL | Status: DC | PRN

## 2015-12-15 MED ORDER — PROPOFOL 500 MG/50ML IV EMUL
INTRAVENOUS | Status: DC | PRN
  Administered 2015-12-15: 50 ug/kg/min via INTRAVENOUS

## 2015-12-15 MED ORDER — PROMETHAZINE HCL 25 MG/ML IJ SOLN
6.2500 mg | INTRAMUSCULAR | Status: DC | PRN
  Administered 2015-12-15: 6.25 mg via INTRAVENOUS

## 2015-12-15 MED ORDER — FENTANYL CITRATE (PF) 250 MCG/5ML IJ SOLN
INTRAMUSCULAR | Status: AC
  Filled 2015-12-15: qty 5

## 2015-12-15 MED ORDER — PROPOFOL 10 MG/ML IV BOLUS
INTRAVENOUS | Status: DC | PRN
  Administered 2015-12-15: 40 mg via INTRAVENOUS
  Administered 2015-12-15: 20 mg via INTRAVENOUS
  Administered 2015-12-15: 10 mg via INTRAVENOUS
  Administered 2015-12-15: 20 mg via INTRAVENOUS

## 2015-12-15 SURGICAL SUPPLY — 44 items
CANISTER SUCTION 2500CC (MISCELLANEOUS) ×3 IMPLANT
CLIP TI MEDIUM 24 (CLIP) ×3 IMPLANT
CLIP TI WIDE RED SMALL 24 (CLIP) ×3 IMPLANT
CORDS BIPOLAR (ELECTRODE) IMPLANT
COVER PROBE W GEL 5X96 (DRAPES) ×3 IMPLANT
DECANTER SPIKE VIAL GLASS SM (MISCELLANEOUS) ×3 IMPLANT
DRSG COVADERM 4X10 (GAUZE/BANDAGES/DRESSINGS) IMPLANT
DRSG COVADERM 4X8 (GAUZE/BANDAGES/DRESSINGS) IMPLANT
ELECT REM PT RETURN 9FT ADLT (ELECTROSURGICAL) ×3
ELECTRODE REM PT RTRN 9FT ADLT (ELECTROSURGICAL) ×1 IMPLANT
GAUZE SPONGE 4X4 16PLY XRAY LF (GAUZE/BANDAGES/DRESSINGS) ×3 IMPLANT
GLOVE BIO SURGEON STRL SZ7 (GLOVE) ×3 IMPLANT
GLOVE BIO SURGEON STRL SZ7.5 (GLOVE) ×3 IMPLANT
GLOVE BIOGEL PI IND STRL 6.5 (GLOVE) ×2 IMPLANT
GLOVE BIOGEL PI IND STRL 7.5 (GLOVE) ×2 IMPLANT
GLOVE BIOGEL PI IND STRL 8 (GLOVE) ×2 IMPLANT
GLOVE BIOGEL PI INDICATOR 6.5 (GLOVE) ×4
GLOVE BIOGEL PI INDICATOR 7.5 (GLOVE) ×4
GLOVE BIOGEL PI INDICATOR 8 (GLOVE) ×4
GLOVE ECLIPSE 6.5 STRL STRAW (GLOVE) ×9 IMPLANT
GLOVE ECLIPSE 7.0 STRL STRAW (GLOVE) ×3 IMPLANT
GOWN EXTRA PROTECTION XL (GOWNS) ×3 IMPLANT
GOWN STRL REUS W/ TWL LRG LVL3 (GOWN DISPOSABLE) ×3 IMPLANT
GOWN STRL REUS W/TWL LRG LVL3 (GOWN DISPOSABLE) ×6
HEMOSTAT SPONGE AVITENE ULTRA (HEMOSTASIS) ×6 IMPLANT
KIT BASIN OR (CUSTOM PROCEDURE TRAY) ×3 IMPLANT
KIT ROOM TURNOVER OR (KITS) ×3 IMPLANT
LIQUID BAND (GAUZE/BANDAGES/DRESSINGS) ×3 IMPLANT
LOOP VESSEL MAXI BLUE (MISCELLANEOUS) ×3 IMPLANT
NS IRRIG 1000ML POUR BTL (IV SOLUTION) ×3 IMPLANT
PACK CV ACCESS (CUSTOM PROCEDURE TRAY) ×3 IMPLANT
PAD ARMBOARD 7.5X6 YLW CONV (MISCELLANEOUS) ×6 IMPLANT
SPONGE SURGIFOAM ABS GEL 100 (HEMOSTASIS) IMPLANT
STAPLER VISISTAT 35W (STAPLE) IMPLANT
SUT MNCRL AB 4-0 PS2 18 (SUTURE) ×3 IMPLANT
SUT PROLENE 6 0 BV (SUTURE) ×3 IMPLANT
SUT PROLENE 7 0 BV 1 (SUTURE) ×3 IMPLANT
SUT SILK 2 0 SH (SUTURE) ×3 IMPLANT
SUT VIC AB 2-0 CT1 27 (SUTURE) ×2
SUT VIC AB 2-0 CT1 TAPERPNT 27 (SUTURE) ×1 IMPLANT
SUT VIC AB 3-0 SH 27 (SUTURE) ×10
SUT VIC AB 3-0 SH 27X BRD (SUTURE) ×5 IMPLANT
UNDERPAD 30X30 INCONTINENT (UNDERPADS AND DIAPERS) ×3 IMPLANT
WATER STERILE IRR 1000ML POUR (IV SOLUTION) ×3 IMPLANT

## 2015-12-15 NOTE — Op Note (Signed)
    OPERATIVE NOTE   PROCEDURE: right second stage brachial vein transposition (brachiobrachial arteriovenous fistula) placement  PRE-OPERATIVE DIAGNOSIS: end stage renal disease, s/p successful first stage brachial vein transposition   POST-OPERATIVE DIAGNOSIS: same as above   SURGEON: Adele Barthel, MD  ASSISTANT(S): Gerri Lins, PAC   ANESTHESIA: general  ESTIMATED BLOOD LOSS: 50 cc  FINDING(S): 1.  Fistula: >6 mm throughout most of length, 4-5 near anastomosis 2.  Easily palpable thrill in fistula at end of case  SPECIMEN(S):  none  INDICATIONS:   Alexis Washington is a 82 y.o. female who presents with end stage renal disease s/p successful right first stage brachial vein transposition .  The patient is scheduled for right second stage brachial vein transposition.  The patient is aware the risks include but are not limited to: bleeding, infection, steal syndrome, nerve damage, ischemic monomelic neuropathy, failure to mature, and need for additional procedures.  The patient is aware of the risks of the procedure and elects to proceed forward.  DESCRIPTION: After full informed written consent was obtained from the patient, the patient was brought back to the operating room and placed supine upon the operating table.  Prior to induction, the patient received IV antibiotics.   After obtaining adequate anesthesia, the patient was then prepped and draped in the standard fashion for a right arm access procedure.  I turned my attention first to identifying the patient's brachiobrachial arteriovenous fistula.  Using SonoSite guidance, the location of this fistula was marked out on the skin.  This appeared to be a brachial vein segment that was draining into a basilic vein.  I made an longitudinal incision over the fistula from its arterial anastomosis up to its axillary extent.  I carefully dissected the fistula away from its adjacent nerves, ligating side branches in the process.  Eventually  the entirety of this fistula was mobilized.  At this point, I dissected a plane superficial to the bicipital fascia.  The fistula was secured in this new location with interrupted 3-0 Vicryl stitches tied from side branches to the underlying fascia.  The deep subcutaneous tissue was inspected for bleeding.  Bleeding was controlled with electrocautery and placement of large pieces of thrombin and gelfoam.  I washed out the surgical site after waiting a few minutes, and there was no further bleeding.  I reapproximated the fascia with horizontal mattress stitches of 2-0 Vicryl.  The deep subcutaneous tissue was reapproximated with mattress sutures of 3-0 Vicryl to eliminate some of the dead space.  The superficial subcutaneous tissue was then reapproximated along the incision line with a running stitch of 3-0 Vicryl.  The skin was then reapproximated with a running subcuticular of 4-0 Monocryl.  The skin was then cleaned, dried, and reinforced with Dermabond.  The patient tolerated this procedure well.    COMPLICATIONS: none  CONDITION: stable   Adele Barthel, MD [facility] Office: [phone] Pager: [phone]  12/15/2015, 9:24 AM

## 2015-12-15 NOTE — Transfer of Care (Signed)
Immediate Anesthesia Transfer of Care Note  Patient: Alexis Washington  Procedure(s) Performed: Procedure(s): SECOND STAGE Right Arm BRACHIAL VEIN TRANSPOSITION (Right)  Patient Location: PACU  Anesthesia Type:General  Level of Consciousness: awake, alert  and oriented  Airway & Oxygen Therapy: Patient Spontanous Breathing  Post-op Assessment: Report given to RN and Post -op Vital signs reviewed and stable  Post vital signs: Reviewed and stable  Last Vitals:  Filed Vitals:   12/15/15 0617 12/15/15 0948  BP: 181/73   Pulse: 68   Temp: 37.2 C 36.5 C  Resp: 18     Complications: No apparent anesthesia complications

## 2015-12-15 NOTE — H&P (Signed)
Brief History and Physical  History of Present Illness  Mariavictoria Nottingham is a 82 y.o. female who presents with chief complaint: ESRD.  The patient presents today for: R 2nd stage BRVT.    Past Medical History  Diagnosis Date  . Hyperkalemia   . Hyperphosphatemia   . Nail-patella syndrome   . Proteinuria   . Hypertension   . Shortness of breath dyspnea   . Heart murmur     PCP- said it is nothing to be concerned  . Hypothyroidism   . Anemia in chronic renal disease     ESRD- Sees Dr Justin Mend  . Hypertensive kidney disease   . Chronic kidney disease, stage V ([place])   . Benign lipomatous neoplasm of kidney   . GERD (gastroesophageal reflux disease)   . Diverticulitis   . Complication of anesthesia 1998    "blood pressure dropped low during surgery and after surgery it went up really high."   . Arthritis   . HOH (hard of hearing)   . PONV (postoperative nausea and vomiting)     Past Surgical History  Procedure Laterality Date  . Abdominal hysterectomy    . Cholecystectomy  1998  . Colonoscopy    . Av fistula placement Right 08/10/2015    Procedure: Right Arm Arteriovenous Brachio-cephalic Fistula ;  Surgeon: Conrad Elma, MD;  Location: [facility];  Service: Vascular;  Laterality: Right;  . Insertion of dialysis catheter N/A 08/10/2015    Procedure: INSERTION OF Right Internal Jugular DIALYSIS CATHETER;  Surgeon: Conrad Hixton, MD;  [facility]: [facility] OR;  Service: Vascular;  Laterality: N/A;  . Fistula superficialization Right 08/10/2015    Procedure: Right First Stage Brachial Transposition;  Surgeon: Conrad [place], MD;  Location: [facility];  Service: Vascular;  Laterality: Right;  . Cataract extraction w/ intraocular lens  implant, bilateral    . Tonsillectomy    . Appendectomy      Social History   Social History  . Marital Status: Married    Spouse Name: N/A  . Number of Children: N/A  . Years of Education: N/A   Occupational History  . Not on file.   Social History Main  Topics  . Smoking status: Never Smoker   . Smokeless tobacco: Never Used  . Alcohol Use: No  . Drug Use: No  . Sexual Activity: Not on file   Other Topics Concern  . Not on file   Social History Narrative    Family History  Problem Relation Age of Onset  . Hypertension Mother   . Heart disease Father     before age 56  . Hyperlipidemia Father   . Hypertension Father   . Heart attack Father     No current facility-administered medications on file prior to encounter.   Current Outpatient Prescriptions on File Prior to Encounter  Medication Sig Dispense Refill  . amLODipine (NORVASC) 2.5 MG tablet Take 2.5 mg by mouth at bedtime.     . carvedilol (COREG) 12.5 MG tablet Take 12.5 mg by mouth 2 (two) times daily with a meal.    . Chlorpheniramine-Acetaminophen (CORICIDIN HBP COLD/FLU PO) Take 1 tablet by mouth 2 (two) times daily as needed (allergies).     . Cholecalciferol (VITAMIN D) 2000 UNITS tablet Take 2,000 Units by mouth daily.    Marland Kitchen conjugated estrogens (PREMARIN) vaginal cream Place 1 Applicatorful vaginally 2 (two) times a week. Wednesday and Sunday    . Epoetin Alfa (PROCRIT IJ)  Inject as directed once a week. On Fridays    . furosemide (LASIX) 20 MG tablet Take 40 mg by mouth 2 (two) times daily.     Marland Kitchen levothyroxine (SYNTHROID, LEVOTHROID) 100 MCG tablet Take 100 mcg by mouth daily before breakfast.     . NITROSTAT 0.4 MG SL tablet Place 0.4 mg under the tongue every 5 (five) minutes as needed for chest pain.     Marland Kitchen ondansetron (ZOFRAN) 4 MG tablet Take 4 mg by mouth every 8 (eight) hours as needed for nausea or vomiting.    Marland Kitchen OVER THE COUNTER MEDICATION Take 30 mLs by mouth daily. Reported on 11/27/2015    . pantoprazole (PROTONIX) 40 MG tablet Take 40 mg by mouth daily.    Marland Kitchen acetaminophen (TYLENOL) 500 MG tablet Take 500 mg by mouth every 6 (six) hours as needed (pain).    Marland Kitchen OVER THE COUNTER MEDICATION Place 1 drop into both eyes daily as needed (allergy symptoms).  Allergy Relief eye drops      Allergies  Allergen Reactions  . Codeine Nausea And Vomiting  . Novocain [Procaine] Palpitations    Review of Systems: As listed above, otherwise negative.  Physical Examination  Filed Vitals:   12/15/15 0617  BP: 181/73  Pulse: 68  Temp: 99 F (37.2 C)  Resp: 18  Height: 5' (1.524 m)  Weight: 120 lb (54.432 kg)  SpO2: 97%    General: A&O x 3, WDWN  Pulmonary: Sym exp, good air movt, CTAB, no rales, rhonchi, & wheezing  Cardiac: RRR, Nl S1, S2, no Murmurs, rubs or gallops  Gastrointestinal: soft, NTND, -G/R, - HSM, - masses, - CVAT B  Musculoskeletal: M/S 5/5 throughout , Extremities without ischemic changes , palpable thrill in R arm, contracted elbow  Laboratory See Robin [place] is a 82 y.o. female who presents with: ESRD.   The patient is scheduled for: R 2nd BRVT  Risk, benefits, and alternatives to access surgery were discussed.  The patient is aware the risks include but are not limited to: bleeding, infection, steal syndrome, nerve damage, ischemic monomelic neuropathy, failure to mature, and need for additional procedures.  The patient is aware of the risks and agrees to proceed.  Adele Barthel, MD [facility] [place] Office: [phone] Pager: [phone]  12/15/2015, 7:17 AM

## 2015-12-15 NOTE — Anesthesia Postprocedure Evaluation (Signed)
Anesthesia Post Note  Patient: Alexis Washington  Procedure(s) Performed: Procedure(s) (LRB): SECOND STAGE Right Arm BRACHIAL VEIN TRANSPOSITION (Right)  Patient location during evaluation: PACU Anesthesia Type: MAC Level of consciousness: awake and alert Pain management: pain level controlled Vital Signs Assessment: post-procedure vital signs reviewed and stable Respiratory status: spontaneous breathing, nonlabored ventilation, respiratory function stable and patient connected to nasal cannula oxygen Cardiovascular status: stable and blood pressure returned to baseline Anesthetic complications: no    Last Vitals:  Filed Vitals:   12/15/15 1045 12/15/15 1050  BP: 131/61 139/64  Pulse: 66 65  Temp: 36.6 C   Resp: 14 16    Last Pain:  Filed Vitals:   12/15/15 1102  PainSc: 3                  Kamil Mchaffie DANIEL

## 2015-12-15 NOTE — Anesthesia Procedure Notes (Signed)
Procedure Name: LMA Insertion Date/Time: 12/15/2015 8:01 AM Performed by: Merdis Delay Pre-anesthesia Checklist: Patient identified, Timeout performed, Emergency Drugs available, Suction available and Patient being monitored Patient Re-evaluated:Patient Re-evaluated prior to inductionOxygen Delivery Method: Circle system utilized Preoxygenation: Pre-oxygenation with 100% oxygen Intubation Type: IV induction Ventilation: Mask ventilation without difficulty LMA: LMA inserted LMA Size: 4.0 Number of attempts: 1 Placement Confirmation: positive ETCO2 and breath sounds checked- equal and bilateral Tube secured with: Tape Dental Injury: Teeth and Oropharynx as per pre-operative assessment

## 2015-12-16 ENCOUNTER — Encounter (HOSPITAL_COMMUNITY): Payer: Self-pay | Admitting: Vascular Surgery

## 2015-12-16 DIAGNOSIS — D689 Coagulation defect, unspecified: Secondary | ICD-10-CM | POA: Diagnosis not present

## 2015-12-16 DIAGNOSIS — L299 Pruritus, unspecified: Secondary | ICD-10-CM | POA: Diagnosis not present

## 2015-12-16 DIAGNOSIS — N186 End stage renal disease: Secondary | ICD-10-CM | POA: Diagnosis not present

## 2015-12-16 DIAGNOSIS — D631 Anemia in chronic kidney disease: Secondary | ICD-10-CM | POA: Diagnosis not present

## 2015-12-16 DIAGNOSIS — D509 Iron deficiency anemia, unspecified: Secondary | ICD-10-CM | POA: Diagnosis not present

## 2015-12-17 ENCOUNTER — Telehealth: Payer: Self-pay | Admitting: Vascular Surgery

## 2015-12-17 NOTE — Telephone Encounter (Signed)
Spoke with pt to schedule, dpm °

## 2015-12-17 NOTE — Telephone Encounter (Signed)
-----   Message from Mena Goes, RN sent at 12/15/2015 11:14 AM EST ----- Regarding: schedule   ----- Message -----    From: Ulyses Amor, PA-C    Sent: 12/15/2015   9:39 AM      To: Vvs Charge Pool  F/U with Dr. Bridgett Larsson in 4 weeks s/p second stage Basilic fistula

## 2015-12-24 DIAGNOSIS — R278 Other lack of coordination: Secondary | ICD-10-CM | POA: Diagnosis not present

## 2015-12-24 DIAGNOSIS — M6281 Muscle weakness (generalized): Secondary | ICD-10-CM | POA: Diagnosis not present

## 2015-12-24 DIAGNOSIS — R230 Cyanosis: Secondary | ICD-10-CM | POA: Diagnosis not present

## 2015-12-24 DIAGNOSIS — R0789 Other chest pain: Secondary | ICD-10-CM | POA: Diagnosis not present

## 2015-12-24 DIAGNOSIS — R002 Palpitations: Secondary | ICD-10-CM | POA: Diagnosis not present

## 2015-12-24 DIAGNOSIS — R262 Difficulty in walking, not elsewhere classified: Secondary | ICD-10-CM | POA: Diagnosis not present

## 2015-12-24 DIAGNOSIS — N186 End stage renal disease: Secondary | ICD-10-CM | POA: Diagnosis not present

## 2015-12-24 DIAGNOSIS — I10 Essential (primary) hypertension: Secondary | ICD-10-CM | POA: Diagnosis not present

## 2015-12-24 DIAGNOSIS — R202 Paresthesia of skin: Secondary | ICD-10-CM | POA: Diagnosis not present

## 2015-12-24 DIAGNOSIS — R2681 Unsteadiness on feet: Secondary | ICD-10-CM | POA: Diagnosis not present

## 2015-12-24 DIAGNOSIS — Q798 Other congenital malformations of musculoskeletal system: Secondary | ICD-10-CM | POA: Diagnosis not present

## 2015-12-24 DIAGNOSIS — W010XXD Fall on same level from slipping, tripping and stumbling without subsequent striking against object, subsequent encounter: Secondary | ICD-10-CM | POA: Diagnosis not present

## 2015-12-29 DIAGNOSIS — Q798 Other congenital malformations of musculoskeletal system: Secondary | ICD-10-CM | POA: Diagnosis not present

## 2015-12-29 DIAGNOSIS — R2681 Unsteadiness on feet: Secondary | ICD-10-CM | POA: Diagnosis not present

## 2015-12-29 DIAGNOSIS — N186 End stage renal disease: Secondary | ICD-10-CM | POA: Diagnosis not present

## 2015-12-29 DIAGNOSIS — R002 Palpitations: Secondary | ICD-10-CM | POA: Diagnosis not present

## 2015-12-29 DIAGNOSIS — I10 Essential (primary) hypertension: Secondary | ICD-10-CM | POA: Diagnosis not present

## 2015-12-29 DIAGNOSIS — M6281 Muscle weakness (generalized): Secondary | ICD-10-CM | POA: Diagnosis not present

## 2015-12-29 DIAGNOSIS — R278 Other lack of coordination: Secondary | ICD-10-CM | POA: Diagnosis not present

## 2015-12-29 DIAGNOSIS — W010XXD Fall on same level from slipping, tripping and stumbling without subsequent striking against object, subsequent encounter: Secondary | ICD-10-CM | POA: Diagnosis not present

## 2015-12-29 DIAGNOSIS — R262 Difficulty in walking, not elsewhere classified: Secondary | ICD-10-CM | POA: Diagnosis not present

## 2015-12-31 DIAGNOSIS — I10 Essential (primary) hypertension: Secondary | ICD-10-CM | POA: Diagnosis not present

## 2015-12-31 DIAGNOSIS — R278 Other lack of coordination: Secondary | ICD-10-CM | POA: Diagnosis not present

## 2015-12-31 DIAGNOSIS — R262 Difficulty in walking, not elsewhere classified: Secondary | ICD-10-CM | POA: Diagnosis not present

## 2015-12-31 DIAGNOSIS — R2681 Unsteadiness on feet: Secondary | ICD-10-CM | POA: Diagnosis not present

## 2015-12-31 DIAGNOSIS — Q798 Other congenital malformations of musculoskeletal system: Secondary | ICD-10-CM | POA: Diagnosis not present

## 2015-12-31 DIAGNOSIS — R002 Palpitations: Secondary | ICD-10-CM | POA: Diagnosis not present

## 2015-12-31 DIAGNOSIS — W010XXD Fall on same level from slipping, tripping and stumbling without subsequent striking against object, subsequent encounter: Secondary | ICD-10-CM | POA: Diagnosis not present

## 2015-12-31 DIAGNOSIS — N186 End stage renal disease: Secondary | ICD-10-CM | POA: Diagnosis not present

## 2015-12-31 DIAGNOSIS — M6281 Muscle weakness (generalized): Secondary | ICD-10-CM | POA: Diagnosis not present

## 2016-01-07 ENCOUNTER — Encounter: Payer: Self-pay | Admitting: Vascular Surgery

## 2016-01-07 DIAGNOSIS — R2681 Unsteadiness on feet: Secondary | ICD-10-CM | POA: Diagnosis not present

## 2016-01-07 DIAGNOSIS — N186 End stage renal disease: Secondary | ICD-10-CM | POA: Diagnosis not present

## 2016-01-07 DIAGNOSIS — R002 Palpitations: Secondary | ICD-10-CM | POA: Diagnosis not present

## 2016-01-07 DIAGNOSIS — M6281 Muscle weakness (generalized): Secondary | ICD-10-CM | POA: Diagnosis not present

## 2016-01-07 DIAGNOSIS — I10 Essential (primary) hypertension: Secondary | ICD-10-CM | POA: Diagnosis not present

## 2016-01-07 DIAGNOSIS — W010XXD Fall on same level from slipping, tripping and stumbling without subsequent striking against object, subsequent encounter: Secondary | ICD-10-CM | POA: Diagnosis not present

## 2016-01-07 DIAGNOSIS — R262 Difficulty in walking, not elsewhere classified: Secondary | ICD-10-CM | POA: Diagnosis not present

## 2016-01-07 DIAGNOSIS — Q798 Other congenital malformations of musculoskeletal system: Secondary | ICD-10-CM | POA: Diagnosis not present

## 2016-01-07 DIAGNOSIS — R278 Other lack of coordination: Secondary | ICD-10-CM | POA: Diagnosis not present

## 2016-01-12 DIAGNOSIS — Q798 Other congenital malformations of musculoskeletal system: Secondary | ICD-10-CM | POA: Diagnosis not present

## 2016-01-12 DIAGNOSIS — R278 Other lack of coordination: Secondary | ICD-10-CM | POA: Diagnosis not present

## 2016-01-12 DIAGNOSIS — N269 Renal sclerosis, unspecified: Secondary | ICD-10-CM | POA: Diagnosis not present

## 2016-01-12 DIAGNOSIS — I10 Essential (primary) hypertension: Secondary | ICD-10-CM | POA: Diagnosis not present

## 2016-01-12 DIAGNOSIS — N186 End stage renal disease: Secondary | ICD-10-CM | POA: Diagnosis not present

## 2016-01-12 DIAGNOSIS — R002 Palpitations: Secondary | ICD-10-CM | POA: Diagnosis not present

## 2016-01-12 DIAGNOSIS — W010XXD Fall on same level from slipping, tripping and stumbling without subsequent striking against object, subsequent encounter: Secondary | ICD-10-CM | POA: Diagnosis not present

## 2016-01-12 DIAGNOSIS — Z992 Dependence on renal dialysis: Secondary | ICD-10-CM | POA: Diagnosis not present

## 2016-01-12 DIAGNOSIS — M6281 Muscle weakness (generalized): Secondary | ICD-10-CM | POA: Diagnosis not present

## 2016-01-12 DIAGNOSIS — R262 Difficulty in walking, not elsewhere classified: Secondary | ICD-10-CM | POA: Diagnosis not present

## 2016-01-12 DIAGNOSIS — R2681 Unsteadiness on feet: Secondary | ICD-10-CM | POA: Diagnosis not present

## 2016-01-13 DIAGNOSIS — D509 Iron deficiency anemia, unspecified: Secondary | ICD-10-CM | POA: Diagnosis not present

## 2016-01-13 DIAGNOSIS — D692 Other nonthrombocytopenic purpura: Secondary | ICD-10-CM | POA: Diagnosis not present

## 2016-01-13 DIAGNOSIS — D689 Coagulation defect, unspecified: Secondary | ICD-10-CM | POA: Diagnosis not present

## 2016-01-13 DIAGNOSIS — D631 Anemia in chronic kidney disease: Secondary | ICD-10-CM | POA: Diagnosis not present

## 2016-01-13 DIAGNOSIS — N186 End stage renal disease: Secondary | ICD-10-CM | POA: Diagnosis not present

## 2016-01-15 ENCOUNTER — Encounter: Payer: Self-pay | Admitting: Vascular Surgery

## 2016-01-15 ENCOUNTER — Ambulatory Visit (INDEPENDENT_AMBULATORY_CARE_PROVIDER_SITE_OTHER): Payer: Self-pay | Admitting: Vascular Surgery

## 2016-01-15 VITALS — BP 156/70 | HR 64 | Temp 97.3°F | Resp 14 | Ht 63.0 in | Wt 123.0 lb

## 2016-01-15 DIAGNOSIS — N186 End stage renal disease: Secondary | ICD-10-CM

## 2016-01-15 DIAGNOSIS — Z992 Dependence on renal dialysis: Secondary | ICD-10-CM

## 2016-01-15 NOTE — Progress Notes (Signed)
    Postoperative Access Visit   History of Present Illness  Alexis Washington is a 82 y.o. year old female who presents for postoperative follow-up for: R 2nd BRVT (Date: 12/15/15).  The patient's wounds are healed.  The patient notes no steal symptoms.  The patient is able to complete their activities of daily living.  The patient's current symptoms are: mild right forearm pain.  For VQI Use Only  PRE-ADM LIVING: Home  AMB STATUS: Ambulatory  Physical Examination Filed Vitals:   01/15/16 1502  BP: 156/70  Pulse: 64  Temp: 97.3 F (36.3 C)  Resp: 14    RUE: Incision is healed, skin feels warm, hand grip is 5/5, sensation in digits is intact, palpable thrill, bruit can be auscultated   Medical Decision Making  Alexis Washington is a 82 y.o. year old female who presents s/p L 2nd BRVT.   The patient's access is ready for use.  The patient's tunneled dialysis catheter can be removed after two successful cannulations and completed dialysis treatments.  Thank you for allowing [place] to participate in this patient's care.  Adele Barthel, MD [facility] [place] Office: [phone] Pager: [phone]  01/15/2016, 12:30 AM

## 2016-01-19 DIAGNOSIS — W010XXD Fall on same level from slipping, tripping and stumbling without subsequent striking against object, subsequent encounter: Secondary | ICD-10-CM | POA: Diagnosis not present

## 2016-01-19 DIAGNOSIS — R2681 Unsteadiness on feet: Secondary | ICD-10-CM | POA: Diagnosis not present

## 2016-01-19 DIAGNOSIS — R262 Difficulty in walking, not elsewhere classified: Secondary | ICD-10-CM | POA: Diagnosis not present

## 2016-01-19 DIAGNOSIS — R002 Palpitations: Secondary | ICD-10-CM | POA: Diagnosis not present

## 2016-01-19 DIAGNOSIS — N186 End stage renal disease: Secondary | ICD-10-CM | POA: Diagnosis not present

## 2016-01-19 DIAGNOSIS — Q798 Other congenital malformations of musculoskeletal system: Secondary | ICD-10-CM | POA: Diagnosis not present

## 2016-01-19 DIAGNOSIS — R278 Other lack of coordination: Secondary | ICD-10-CM | POA: Diagnosis not present

## 2016-01-19 DIAGNOSIS — M6281 Muscle weakness (generalized): Secondary | ICD-10-CM | POA: Diagnosis not present

## 2016-01-19 DIAGNOSIS — I10 Essential (primary) hypertension: Secondary | ICD-10-CM | POA: Diagnosis not present

## 2016-01-26 DIAGNOSIS — I10 Essential (primary) hypertension: Secondary | ICD-10-CM | POA: Diagnosis not present

## 2016-01-26 DIAGNOSIS — N186 End stage renal disease: Secondary | ICD-10-CM | POA: Diagnosis not present

## 2016-01-26 DIAGNOSIS — W010XXD Fall on same level from slipping, tripping and stumbling without subsequent striking against object, subsequent encounter: Secondary | ICD-10-CM | POA: Diagnosis not present

## 2016-01-26 DIAGNOSIS — R278 Other lack of coordination: Secondary | ICD-10-CM | POA: Diagnosis not present

## 2016-01-26 DIAGNOSIS — R002 Palpitations: Secondary | ICD-10-CM | POA: Diagnosis not present

## 2016-01-26 DIAGNOSIS — R2681 Unsteadiness on feet: Secondary | ICD-10-CM | POA: Diagnosis not present

## 2016-01-26 DIAGNOSIS — Q798 Other congenital malformations of musculoskeletal system: Secondary | ICD-10-CM | POA: Diagnosis not present

## 2016-01-26 DIAGNOSIS — R262 Difficulty in walking, not elsewhere classified: Secondary | ICD-10-CM | POA: Diagnosis not present

## 2016-01-26 DIAGNOSIS — M6281 Muscle weakness (generalized): Secondary | ICD-10-CM | POA: Diagnosis not present

## 2016-02-02 DIAGNOSIS — Q798 Other congenital malformations of musculoskeletal system: Secondary | ICD-10-CM | POA: Diagnosis not present

## 2016-02-02 DIAGNOSIS — R002 Palpitations: Secondary | ICD-10-CM | POA: Diagnosis not present

## 2016-02-02 DIAGNOSIS — M6281 Muscle weakness (generalized): Secondary | ICD-10-CM | POA: Diagnosis not present

## 2016-02-02 DIAGNOSIS — W010XXD Fall on same level from slipping, tripping and stumbling without subsequent striking against object, subsequent encounter: Secondary | ICD-10-CM | POA: Diagnosis not present

## 2016-02-02 DIAGNOSIS — R2681 Unsteadiness on feet: Secondary | ICD-10-CM | POA: Diagnosis not present

## 2016-02-02 DIAGNOSIS — I10 Essential (primary) hypertension: Secondary | ICD-10-CM | POA: Diagnosis not present

## 2016-02-02 DIAGNOSIS — R262 Difficulty in walking, not elsewhere classified: Secondary | ICD-10-CM | POA: Diagnosis not present

## 2016-02-02 DIAGNOSIS — N186 End stage renal disease: Secondary | ICD-10-CM | POA: Diagnosis not present

## 2016-02-02 DIAGNOSIS — R278 Other lack of coordination: Secondary | ICD-10-CM | POA: Diagnosis not present

## 2016-02-09 DIAGNOSIS — R262 Difficulty in walking, not elsewhere classified: Secondary | ICD-10-CM | POA: Diagnosis not present

## 2016-02-09 DIAGNOSIS — W010XXD Fall on same level from slipping, tripping and stumbling without subsequent striking against object, subsequent encounter: Secondary | ICD-10-CM | POA: Diagnosis not present

## 2016-02-09 DIAGNOSIS — R278 Other lack of coordination: Secondary | ICD-10-CM | POA: Diagnosis not present

## 2016-02-09 DIAGNOSIS — M6281 Muscle weakness (generalized): Secondary | ICD-10-CM | POA: Diagnosis not present

## 2016-02-09 DIAGNOSIS — R002 Palpitations: Secondary | ICD-10-CM | POA: Diagnosis not present

## 2016-02-09 DIAGNOSIS — Q798 Other congenital malformations of musculoskeletal system: Secondary | ICD-10-CM | POA: Diagnosis not present

## 2016-02-09 DIAGNOSIS — N186 End stage renal disease: Secondary | ICD-10-CM | POA: Diagnosis not present

## 2016-02-09 DIAGNOSIS — R2681 Unsteadiness on feet: Secondary | ICD-10-CM | POA: Diagnosis not present

## 2016-02-09 DIAGNOSIS — I10 Essential (primary) hypertension: Secondary | ICD-10-CM | POA: Diagnosis not present

## 2016-02-12 DIAGNOSIS — Z992 Dependence on renal dialysis: Secondary | ICD-10-CM | POA: Diagnosis not present

## 2016-02-12 DIAGNOSIS — N269 Renal sclerosis, unspecified: Secondary | ICD-10-CM | POA: Diagnosis not present

## 2016-02-12 DIAGNOSIS — N186 End stage renal disease: Secondary | ICD-10-CM | POA: Diagnosis not present

## 2016-02-15 DIAGNOSIS — D509 Iron deficiency anemia, unspecified: Secondary | ICD-10-CM | POA: Diagnosis not present

## 2016-02-15 DIAGNOSIS — D631 Anemia in chronic kidney disease: Secondary | ICD-10-CM | POA: Diagnosis not present

## 2016-02-15 DIAGNOSIS — D689 Coagulation defect, unspecified: Secondary | ICD-10-CM | POA: Diagnosis not present

## 2016-02-15 DIAGNOSIS — Z23 Encounter for immunization: Secondary | ICD-10-CM | POA: Diagnosis not present

## 2016-02-15 DIAGNOSIS — D696 Thrombocytopenia, unspecified: Secondary | ICD-10-CM | POA: Diagnosis not present

## 2016-02-15 DIAGNOSIS — N186 End stage renal disease: Secondary | ICD-10-CM | POA: Diagnosis not present

## 2016-02-16 DIAGNOSIS — R262 Difficulty in walking, not elsewhere classified: Secondary | ICD-10-CM | POA: Diagnosis not present

## 2016-02-16 DIAGNOSIS — W010XXD Fall on same level from slipping, tripping and stumbling without subsequent striking against object, subsequent encounter: Secondary | ICD-10-CM | POA: Diagnosis not present

## 2016-02-16 DIAGNOSIS — N186 End stage renal disease: Secondary | ICD-10-CM | POA: Diagnosis not present

## 2016-02-16 DIAGNOSIS — M6281 Muscle weakness (generalized): Secondary | ICD-10-CM | POA: Diagnosis not present

## 2016-02-16 DIAGNOSIS — R002 Palpitations: Secondary | ICD-10-CM | POA: Diagnosis not present

## 2016-02-16 DIAGNOSIS — R2681 Unsteadiness on feet: Secondary | ICD-10-CM | POA: Diagnosis not present

## 2016-02-16 DIAGNOSIS — Q798 Other congenital malformations of musculoskeletal system: Secondary | ICD-10-CM | POA: Diagnosis not present

## 2016-02-16 DIAGNOSIS — R278 Other lack of coordination: Secondary | ICD-10-CM | POA: Diagnosis not present

## 2016-02-16 DIAGNOSIS — I10 Essential (primary) hypertension: Secondary | ICD-10-CM | POA: Diagnosis not present

## 2016-02-25 DIAGNOSIS — M6281 Muscle weakness (generalized): Secondary | ICD-10-CM | POA: Diagnosis not present

## 2016-02-25 DIAGNOSIS — R262 Difficulty in walking, not elsewhere classified: Secondary | ICD-10-CM | POA: Diagnosis not present

## 2016-02-25 DIAGNOSIS — R002 Palpitations: Secondary | ICD-10-CM | POA: Diagnosis not present

## 2016-02-25 DIAGNOSIS — R278 Other lack of coordination: Secondary | ICD-10-CM | POA: Diagnosis not present

## 2016-02-25 DIAGNOSIS — W010XXD Fall on same level from slipping, tripping and stumbling without subsequent striking against object, subsequent encounter: Secondary | ICD-10-CM | POA: Diagnosis not present

## 2016-02-25 DIAGNOSIS — N186 End stage renal disease: Secondary | ICD-10-CM | POA: Diagnosis not present

## 2016-02-25 DIAGNOSIS — R2681 Unsteadiness on feet: Secondary | ICD-10-CM | POA: Diagnosis not present

## 2016-02-25 DIAGNOSIS — I10 Essential (primary) hypertension: Secondary | ICD-10-CM | POA: Diagnosis not present

## 2016-02-25 DIAGNOSIS — Q798 Other congenital malformations of musculoskeletal system: Secondary | ICD-10-CM | POA: Diagnosis not present

## 2016-03-01 DIAGNOSIS — Q798 Other congenital malformations of musculoskeletal system: Secondary | ICD-10-CM | POA: Diagnosis not present

## 2016-03-01 DIAGNOSIS — E038 Other specified hypothyroidism: Secondary | ICD-10-CM | POA: Diagnosis not present

## 2016-03-01 DIAGNOSIS — E782 Mixed hyperlipidemia: Secondary | ICD-10-CM | POA: Diagnosis not present

## 2016-03-01 DIAGNOSIS — I10 Essential (primary) hypertension: Secondary | ICD-10-CM | POA: Diagnosis not present

## 2016-03-01 DIAGNOSIS — N186 End stage renal disease: Secondary | ICD-10-CM | POA: Diagnosis not present

## 2016-03-01 DIAGNOSIS — W19XXXD Unspecified fall, subsequent encounter: Secondary | ICD-10-CM | POA: Diagnosis not present

## 2016-03-01 DIAGNOSIS — D631 Anemia in chronic kidney disease: Secondary | ICD-10-CM | POA: Diagnosis not present

## 2016-03-02 ENCOUNTER — Encounter: Payer: Self-pay | Admitting: Vascular Surgery

## 2016-03-03 DIAGNOSIS — R2681 Unsteadiness on feet: Secondary | ICD-10-CM | POA: Diagnosis not present

## 2016-03-03 DIAGNOSIS — W010XXD Fall on same level from slipping, tripping and stumbling without subsequent striking against object, subsequent encounter: Secondary | ICD-10-CM | POA: Diagnosis not present

## 2016-03-03 DIAGNOSIS — N186 End stage renal disease: Secondary | ICD-10-CM | POA: Diagnosis not present

## 2016-03-03 DIAGNOSIS — R002 Palpitations: Secondary | ICD-10-CM | POA: Diagnosis not present

## 2016-03-03 DIAGNOSIS — R262 Difficulty in walking, not elsewhere classified: Secondary | ICD-10-CM | POA: Diagnosis not present

## 2016-03-03 DIAGNOSIS — Q798 Other congenital malformations of musculoskeletal system: Secondary | ICD-10-CM | POA: Diagnosis not present

## 2016-03-03 DIAGNOSIS — R278 Other lack of coordination: Secondary | ICD-10-CM | POA: Diagnosis not present

## 2016-03-03 DIAGNOSIS — M6281 Muscle weakness (generalized): Secondary | ICD-10-CM | POA: Diagnosis not present

## 2016-03-03 DIAGNOSIS — I10 Essential (primary) hypertension: Secondary | ICD-10-CM | POA: Diagnosis not present

## 2016-03-09 ENCOUNTER — Encounter: Payer: Self-pay | Admitting: Vascular Surgery

## 2016-03-09 ENCOUNTER — Ambulatory Visit (INDEPENDENT_AMBULATORY_CARE_PROVIDER_SITE_OTHER): Payer: Self-pay | Admitting: Vascular Surgery

## 2016-03-09 VITALS — BP 139/72 | HR 69 | Temp 97.1°F | Resp 14 | Ht 60.0 in | Wt 124.0 lb

## 2016-03-09 DIAGNOSIS — N186 End stage renal disease: Secondary | ICD-10-CM

## 2016-03-09 DIAGNOSIS — Z992 Dependence on renal dialysis: Secondary | ICD-10-CM

## 2016-03-09 NOTE — Progress Notes (Signed)
    Postoperative Access Visit   History of Present Illness  Alexis Washington is a 82 y.o. year old female who presents for postoperative follow-up for: R 2nd BRVT (Date: 12/15/15).  The patient's wounds are healed.  The patient notes one episode of pain in her R 5th finger.  The patient is able to complete their activities of daily living.  The patient's current symptoms are: difficulty with cannulation.  She has been cannulated successfully once with complete run..  For VQI Use Only  PRE-ADM LIVING: Home  AMB STATUS: Ambulatory  Physical Examination Filed Vitals:   03/09/16 1456  BP: 139/72  Pulse: 69  Temp: 97.1 F (36.2 C)  Resp: 14    RUE: Incision is healed, skin feels warm, hand grip is 5/5, sensation in digits is intact, palpable thrill, bruit can be auscultated, faintly palpable R radial pulse  Medical Decision Making  Alexis Washington is a 82 y.o. year old female who presents s/p L 2nd BRVT .  I have marked the fistula's route on this patient's arm.  I suspect part of the difficulty has been the limitation in arm rotation due to this patient's contracture.  As she has already had a successful HD session, this suggests cannulation technique rather function of the fistula is the issue.  Given the episode of possible steal previously, I have concerns that revision of the distal anastomosis which is tapered, might result in future steal from the right hand.  The patient's tunneled dialysis catheter can be removed after two successful cannulations and completed dialysis treatments.  Let us known if cannulation continues to be a problem.  Thank you for allowing [place] to participate in this patient's care.  Adele Barthel, MD [facility] [place] Office: [phone] Pager: [phone]  03/09/2016, 3:45 PM

## 2016-03-10 ENCOUNTER — Encounter: Payer: Self-pay | Admitting: Nephrology

## 2016-03-13 DIAGNOSIS — Z992 Dependence on renal dialysis: Secondary | ICD-10-CM | POA: Diagnosis not present

## 2016-03-13 DIAGNOSIS — N186 End stage renal disease: Secondary | ICD-10-CM | POA: Diagnosis not present

## 2016-03-13 DIAGNOSIS — N269 Renal sclerosis, unspecified: Secondary | ICD-10-CM | POA: Diagnosis not present

## 2016-03-14 DIAGNOSIS — N186 End stage renal disease: Secondary | ICD-10-CM | POA: Diagnosis not present

## 2016-03-14 DIAGNOSIS — D631 Anemia in chronic kidney disease: Secondary | ICD-10-CM | POA: Diagnosis not present

## 2016-03-14 DIAGNOSIS — D689 Coagulation defect, unspecified: Secondary | ICD-10-CM | POA: Diagnosis not present

## 2016-03-17 DIAGNOSIS — R262 Difficulty in walking, not elsewhere classified: Secondary | ICD-10-CM | POA: Diagnosis not present

## 2016-03-17 DIAGNOSIS — M6281 Muscle weakness (generalized): Secondary | ICD-10-CM | POA: Diagnosis not present

## 2016-03-17 DIAGNOSIS — R2681 Unsteadiness on feet: Secondary | ICD-10-CM | POA: Diagnosis not present

## 2016-03-17 DIAGNOSIS — R002 Palpitations: Secondary | ICD-10-CM | POA: Diagnosis not present

## 2016-03-17 DIAGNOSIS — N186 End stage renal disease: Secondary | ICD-10-CM | POA: Diagnosis not present

## 2016-03-17 DIAGNOSIS — W010XXD Fall on same level from slipping, tripping and stumbling without subsequent striking against object, subsequent encounter: Secondary | ICD-10-CM | POA: Diagnosis not present

## 2016-03-17 DIAGNOSIS — I10 Essential (primary) hypertension: Secondary | ICD-10-CM | POA: Diagnosis not present

## 2016-03-17 DIAGNOSIS — R278 Other lack of coordination: Secondary | ICD-10-CM | POA: Diagnosis not present

## 2016-03-17 DIAGNOSIS — Q798 Other congenital malformations of musculoskeletal system: Secondary | ICD-10-CM | POA: Diagnosis not present

## 2016-03-22 DIAGNOSIS — W010XXD Fall on same level from slipping, tripping and stumbling without subsequent striking against object, subsequent encounter: Secondary | ICD-10-CM | POA: Diagnosis not present

## 2016-03-22 DIAGNOSIS — N186 End stage renal disease: Secondary | ICD-10-CM | POA: Diagnosis not present

## 2016-03-22 DIAGNOSIS — R002 Palpitations: Secondary | ICD-10-CM | POA: Diagnosis not present

## 2016-03-22 DIAGNOSIS — R278 Other lack of coordination: Secondary | ICD-10-CM | POA: Diagnosis not present

## 2016-03-22 DIAGNOSIS — I10 Essential (primary) hypertension: Secondary | ICD-10-CM | POA: Diagnosis not present

## 2016-03-22 DIAGNOSIS — M6281 Muscle weakness (generalized): Secondary | ICD-10-CM | POA: Diagnosis not present

## 2016-03-22 DIAGNOSIS — R2681 Unsteadiness on feet: Secondary | ICD-10-CM | POA: Diagnosis not present

## 2016-03-22 DIAGNOSIS — Q798 Other congenital malformations of musculoskeletal system: Secondary | ICD-10-CM | POA: Diagnosis not present

## 2016-03-22 DIAGNOSIS — R262 Difficulty in walking, not elsewhere classified: Secondary | ICD-10-CM | POA: Diagnosis not present

## 2016-03-29 DIAGNOSIS — R2681 Unsteadiness on feet: Secondary | ICD-10-CM | POA: Diagnosis not present

## 2016-03-29 DIAGNOSIS — N186 End stage renal disease: Secondary | ICD-10-CM | POA: Diagnosis not present

## 2016-03-29 DIAGNOSIS — M6281 Muscle weakness (generalized): Secondary | ICD-10-CM | POA: Diagnosis not present

## 2016-03-29 DIAGNOSIS — R278 Other lack of coordination: Secondary | ICD-10-CM | POA: Diagnosis not present

## 2016-03-29 DIAGNOSIS — W010XXD Fall on same level from slipping, tripping and stumbling without subsequent striking against object, subsequent encounter: Secondary | ICD-10-CM | POA: Diagnosis not present

## 2016-03-29 DIAGNOSIS — I10 Essential (primary) hypertension: Secondary | ICD-10-CM | POA: Diagnosis not present

## 2016-03-29 DIAGNOSIS — R262 Difficulty in walking, not elsewhere classified: Secondary | ICD-10-CM | POA: Diagnosis not present

## 2016-03-29 DIAGNOSIS — Q798 Other congenital malformations of musculoskeletal system: Secondary | ICD-10-CM | POA: Diagnosis not present

## 2016-03-29 DIAGNOSIS — R002 Palpitations: Secondary | ICD-10-CM | POA: Diagnosis not present

## 2016-04-05 DIAGNOSIS — Q798 Other congenital malformations of musculoskeletal system: Secondary | ICD-10-CM | POA: Diagnosis not present

## 2016-04-05 DIAGNOSIS — R262 Difficulty in walking, not elsewhere classified: Secondary | ICD-10-CM | POA: Diagnosis not present

## 2016-04-05 DIAGNOSIS — I10 Essential (primary) hypertension: Secondary | ICD-10-CM | POA: Diagnosis not present

## 2016-04-05 DIAGNOSIS — N186 End stage renal disease: Secondary | ICD-10-CM | POA: Diagnosis not present

## 2016-04-05 DIAGNOSIS — M6281 Muscle weakness (generalized): Secondary | ICD-10-CM | POA: Diagnosis not present

## 2016-04-05 DIAGNOSIS — R2681 Unsteadiness on feet: Secondary | ICD-10-CM | POA: Diagnosis not present

## 2016-04-05 DIAGNOSIS — R278 Other lack of coordination: Secondary | ICD-10-CM | POA: Diagnosis not present

## 2016-04-05 DIAGNOSIS — W010XXD Fall on same level from slipping, tripping and stumbling without subsequent striking against object, subsequent encounter: Secondary | ICD-10-CM | POA: Diagnosis not present

## 2016-04-05 DIAGNOSIS — R002 Palpitations: Secondary | ICD-10-CM | POA: Diagnosis not present

## 2016-04-12 DIAGNOSIS — N186 End stage renal disease: Secondary | ICD-10-CM | POA: Diagnosis not present

## 2016-04-12 DIAGNOSIS — R278 Other lack of coordination: Secondary | ICD-10-CM | POA: Diagnosis not present

## 2016-04-12 DIAGNOSIS — R002 Palpitations: Secondary | ICD-10-CM | POA: Diagnosis not present

## 2016-04-12 DIAGNOSIS — I10 Essential (primary) hypertension: Secondary | ICD-10-CM | POA: Diagnosis not present

## 2016-04-12 DIAGNOSIS — R2681 Unsteadiness on feet: Secondary | ICD-10-CM | POA: Diagnosis not present

## 2016-04-12 DIAGNOSIS — M6281 Muscle weakness (generalized): Secondary | ICD-10-CM | POA: Diagnosis not present

## 2016-04-12 DIAGNOSIS — W010XXD Fall on same level from slipping, tripping and stumbling without subsequent striking against object, subsequent encounter: Secondary | ICD-10-CM | POA: Diagnosis not present

## 2016-04-12 DIAGNOSIS — R262 Difficulty in walking, not elsewhere classified: Secondary | ICD-10-CM | POA: Diagnosis not present

## 2016-04-12 DIAGNOSIS — Q798 Other congenital malformations of musculoskeletal system: Secondary | ICD-10-CM | POA: Diagnosis not present

## 2016-04-13 DIAGNOSIS — N186 End stage renal disease: Secondary | ICD-10-CM | POA: Diagnosis not present

## 2016-04-13 DIAGNOSIS — N269 Renal sclerosis, unspecified: Secondary | ICD-10-CM | POA: Diagnosis not present

## 2016-04-13 DIAGNOSIS — Z992 Dependence on renal dialysis: Secondary | ICD-10-CM | POA: Diagnosis not present

## 2016-04-15 DIAGNOSIS — N186 End stage renal disease: Secondary | ICD-10-CM | POA: Diagnosis not present

## 2016-04-15 DIAGNOSIS — D689 Coagulation defect, unspecified: Secondary | ICD-10-CM | POA: Diagnosis not present

## 2016-04-15 DIAGNOSIS — D631 Anemia in chronic kidney disease: Secondary | ICD-10-CM | POA: Diagnosis not present

## 2016-04-19 DIAGNOSIS — I10 Essential (primary) hypertension: Secondary | ICD-10-CM | POA: Diagnosis not present

## 2016-04-19 DIAGNOSIS — R002 Palpitations: Secondary | ICD-10-CM | POA: Diagnosis not present

## 2016-04-19 DIAGNOSIS — Q798 Other congenital malformations of musculoskeletal system: Secondary | ICD-10-CM | POA: Diagnosis not present

## 2016-04-19 DIAGNOSIS — N186 End stage renal disease: Secondary | ICD-10-CM | POA: Diagnosis not present

## 2016-04-19 DIAGNOSIS — R278 Other lack of coordination: Secondary | ICD-10-CM | POA: Diagnosis not present

## 2016-04-19 DIAGNOSIS — W010XXD Fall on same level from slipping, tripping and stumbling without subsequent striking against object, subsequent encounter: Secondary | ICD-10-CM | POA: Diagnosis not present

## 2016-04-19 DIAGNOSIS — R2681 Unsteadiness on feet: Secondary | ICD-10-CM | POA: Diagnosis not present

## 2016-04-19 DIAGNOSIS — M6281 Muscle weakness (generalized): Secondary | ICD-10-CM | POA: Diagnosis not present

## 2016-04-19 DIAGNOSIS — R262 Difficulty in walking, not elsewhere classified: Secondary | ICD-10-CM | POA: Diagnosis not present

## 2016-04-21 DIAGNOSIS — M6281 Muscle weakness (generalized): Secondary | ICD-10-CM | POA: Diagnosis not present

## 2016-04-21 DIAGNOSIS — R2681 Unsteadiness on feet: Secondary | ICD-10-CM | POA: Diagnosis not present

## 2016-04-22 DIAGNOSIS — R262 Difficulty in walking, not elsewhere classified: Secondary | ICD-10-CM | POA: Diagnosis not present

## 2016-04-22 DIAGNOSIS — R278 Other lack of coordination: Secondary | ICD-10-CM | POA: Diagnosis not present

## 2016-04-22 DIAGNOSIS — Q798 Other congenital malformations of musculoskeletal system: Secondary | ICD-10-CM | POA: Diagnosis not present

## 2016-04-22 DIAGNOSIS — R2681 Unsteadiness on feet: Secondary | ICD-10-CM | POA: Diagnosis not present

## 2016-04-22 DIAGNOSIS — M6281 Muscle weakness (generalized): Secondary | ICD-10-CM | POA: Diagnosis not present

## 2016-04-26 DIAGNOSIS — N186 End stage renal disease: Secondary | ICD-10-CM | POA: Diagnosis not present

## 2016-04-26 DIAGNOSIS — M6281 Muscle weakness (generalized): Secondary | ICD-10-CM | POA: Diagnosis not present

## 2016-04-26 DIAGNOSIS — R002 Palpitations: Secondary | ICD-10-CM | POA: Diagnosis not present

## 2016-04-26 DIAGNOSIS — R262 Difficulty in walking, not elsewhere classified: Secondary | ICD-10-CM | POA: Diagnosis not present

## 2016-04-26 DIAGNOSIS — Q798 Other congenital malformations of musculoskeletal system: Secondary | ICD-10-CM | POA: Diagnosis not present

## 2016-04-26 DIAGNOSIS — I10 Essential (primary) hypertension: Secondary | ICD-10-CM | POA: Diagnosis not present

## 2016-04-26 DIAGNOSIS — R2681 Unsteadiness on feet: Secondary | ICD-10-CM | POA: Diagnosis not present

## 2016-04-26 DIAGNOSIS — R278 Other lack of coordination: Secondary | ICD-10-CM | POA: Diagnosis not present

## 2016-04-26 DIAGNOSIS — W010XXD Fall on same level from slipping, tripping and stumbling without subsequent striking against object, subsequent encounter: Secondary | ICD-10-CM | POA: Diagnosis not present

## 2016-05-12 DIAGNOSIS — R262 Difficulty in walking, not elsewhere classified: Secondary | ICD-10-CM | POA: Diagnosis not present

## 2016-05-12 DIAGNOSIS — Q798 Other congenital malformations of musculoskeletal system: Secondary | ICD-10-CM | POA: Diagnosis not present

## 2016-05-12 DIAGNOSIS — R002 Palpitations: Secondary | ICD-10-CM | POA: Diagnosis not present

## 2016-05-12 DIAGNOSIS — N186 End stage renal disease: Secondary | ICD-10-CM | POA: Diagnosis not present

## 2016-05-12 DIAGNOSIS — R2681 Unsteadiness on feet: Secondary | ICD-10-CM | POA: Diagnosis not present

## 2016-05-12 DIAGNOSIS — W010XXD Fall on same level from slipping, tripping and stumbling without subsequent striking against object, subsequent encounter: Secondary | ICD-10-CM | POA: Diagnosis not present

## 2016-05-12 DIAGNOSIS — R278 Other lack of coordination: Secondary | ICD-10-CM | POA: Diagnosis not present

## 2016-05-12 DIAGNOSIS — I10 Essential (primary) hypertension: Secondary | ICD-10-CM | POA: Diagnosis not present

## 2016-05-12 DIAGNOSIS — M6281 Muscle weakness (generalized): Secondary | ICD-10-CM | POA: Diagnosis not present

## 2016-05-13 DIAGNOSIS — N186 End stage renal disease: Secondary | ICD-10-CM | POA: Diagnosis not present

## 2016-05-13 DIAGNOSIS — Z992 Dependence on renal dialysis: Secondary | ICD-10-CM | POA: Diagnosis not present

## 2016-05-13 DIAGNOSIS — N269 Renal sclerosis, unspecified: Secondary | ICD-10-CM | POA: Diagnosis not present

## 2016-05-16 DIAGNOSIS — D631 Anemia in chronic kidney disease: Secondary | ICD-10-CM | POA: Diagnosis not present

## 2016-05-16 DIAGNOSIS — N186 End stage renal disease: Secondary | ICD-10-CM | POA: Diagnosis not present

## 2016-05-16 DIAGNOSIS — D689 Coagulation defect, unspecified: Secondary | ICD-10-CM | POA: Diagnosis not present

## 2016-05-16 DIAGNOSIS — D509 Iron deficiency anemia, unspecified: Secondary | ICD-10-CM | POA: Diagnosis not present

## 2016-05-26 DIAGNOSIS — N186 End stage renal disease: Secondary | ICD-10-CM | POA: Diagnosis not present

## 2016-05-26 DIAGNOSIS — Q798 Other congenital malformations of musculoskeletal system: Secondary | ICD-10-CM | POA: Diagnosis not present

## 2016-05-26 DIAGNOSIS — R2681 Unsteadiness on feet: Secondary | ICD-10-CM | POA: Diagnosis not present

## 2016-05-26 DIAGNOSIS — R278 Other lack of coordination: Secondary | ICD-10-CM | POA: Diagnosis not present

## 2016-05-26 DIAGNOSIS — M6281 Muscle weakness (generalized): Secondary | ICD-10-CM | POA: Diagnosis not present

## 2016-05-26 DIAGNOSIS — R262 Difficulty in walking, not elsewhere classified: Secondary | ICD-10-CM | POA: Diagnosis not present

## 2016-05-26 DIAGNOSIS — R002 Palpitations: Secondary | ICD-10-CM | POA: Diagnosis not present

## 2016-05-26 DIAGNOSIS — W010XXD Fall on same level from slipping, tripping and stumbling without subsequent striking against object, subsequent encounter: Secondary | ICD-10-CM | POA: Diagnosis not present

## 2016-05-26 DIAGNOSIS — I10 Essential (primary) hypertension: Secondary | ICD-10-CM | POA: Diagnosis not present

## 2016-05-31 DIAGNOSIS — N186 End stage renal disease: Secondary | ICD-10-CM | POA: Diagnosis not present

## 2016-05-31 DIAGNOSIS — I871 Compression of vein: Secondary | ICD-10-CM | POA: Diagnosis not present

## 2016-05-31 DIAGNOSIS — T82898A Other specified complication of vascular prosthetic devices, implants and grafts, initial encounter: Secondary | ICD-10-CM | POA: Diagnosis not present

## 2016-05-31 DIAGNOSIS — Z992 Dependence on renal dialysis: Secondary | ICD-10-CM | POA: Diagnosis not present

## 2016-06-07 DIAGNOSIS — R2681 Unsteadiness on feet: Secondary | ICD-10-CM | POA: Diagnosis not present

## 2016-06-07 DIAGNOSIS — W010XXD Fall on same level from slipping, tripping and stumbling without subsequent striking against object, subsequent encounter: Secondary | ICD-10-CM | POA: Diagnosis not present

## 2016-06-07 DIAGNOSIS — M6281 Muscle weakness (generalized): Secondary | ICD-10-CM | POA: Diagnosis not present

## 2016-06-07 DIAGNOSIS — R262 Difficulty in walking, not elsewhere classified: Secondary | ICD-10-CM | POA: Diagnosis not present

## 2016-06-07 DIAGNOSIS — R002 Palpitations: Secondary | ICD-10-CM | POA: Diagnosis not present

## 2016-06-07 DIAGNOSIS — I10 Essential (primary) hypertension: Secondary | ICD-10-CM | POA: Diagnosis not present

## 2016-06-07 DIAGNOSIS — R278 Other lack of coordination: Secondary | ICD-10-CM | POA: Diagnosis not present

## 2016-06-07 DIAGNOSIS — Q798 Other congenital malformations of musculoskeletal system: Secondary | ICD-10-CM | POA: Diagnosis not present

## 2016-06-07 DIAGNOSIS — N186 End stage renal disease: Secondary | ICD-10-CM | POA: Diagnosis not present

## 2016-06-13 DIAGNOSIS — N186 End stage renal disease: Secondary | ICD-10-CM | POA: Diagnosis not present

## 2016-06-13 DIAGNOSIS — Z992 Dependence on renal dialysis: Secondary | ICD-10-CM | POA: Diagnosis not present

## 2016-06-13 DIAGNOSIS — N269 Renal sclerosis, unspecified: Secondary | ICD-10-CM | POA: Diagnosis not present

## 2016-06-14 DIAGNOSIS — R002 Palpitations: Secondary | ICD-10-CM | POA: Diagnosis not present

## 2016-06-14 DIAGNOSIS — I10 Essential (primary) hypertension: Secondary | ICD-10-CM | POA: Diagnosis not present

## 2016-06-14 DIAGNOSIS — W010XXD Fall on same level from slipping, tripping and stumbling without subsequent striking against object, subsequent encounter: Secondary | ICD-10-CM | POA: Diagnosis not present

## 2016-06-14 DIAGNOSIS — R278 Other lack of coordination: Secondary | ICD-10-CM | POA: Diagnosis not present

## 2016-06-14 DIAGNOSIS — R262 Difficulty in walking, not elsewhere classified: Secondary | ICD-10-CM | POA: Diagnosis not present

## 2016-06-14 DIAGNOSIS — Q798 Other congenital malformations of musculoskeletal system: Secondary | ICD-10-CM | POA: Diagnosis not present

## 2016-06-14 DIAGNOSIS — N186 End stage renal disease: Secondary | ICD-10-CM | POA: Diagnosis not present

## 2016-06-14 DIAGNOSIS — M6281 Muscle weakness (generalized): Secondary | ICD-10-CM | POA: Diagnosis not present

## 2016-06-14 DIAGNOSIS — R2681 Unsteadiness on feet: Secondary | ICD-10-CM | POA: Diagnosis not present

## 2016-06-15 DIAGNOSIS — D631 Anemia in chronic kidney disease: Secondary | ICD-10-CM | POA: Diagnosis not present

## 2016-06-15 DIAGNOSIS — D689 Coagulation defect, unspecified: Secondary | ICD-10-CM | POA: Diagnosis not present

## 2016-06-15 DIAGNOSIS — E876 Hypokalemia: Secondary | ICD-10-CM | POA: Diagnosis not present

## 2016-06-15 DIAGNOSIS — D509 Iron deficiency anemia, unspecified: Secondary | ICD-10-CM | POA: Diagnosis not present

## 2016-06-15 DIAGNOSIS — N186 End stage renal disease: Secondary | ICD-10-CM | POA: Diagnosis not present

## 2016-06-23 ENCOUNTER — Other Ambulatory Visit: Payer: Self-pay | Admitting: Vascular Surgery

## 2016-06-23 DIAGNOSIS — Q798 Other congenital malformations of musculoskeletal system: Secondary | ICD-10-CM | POA: Diagnosis not present

## 2016-06-23 DIAGNOSIS — N186 End stage renal disease: Secondary | ICD-10-CM | POA: Diagnosis not present

## 2016-06-23 DIAGNOSIS — R2681 Unsteadiness on feet: Secondary | ICD-10-CM | POA: Diagnosis not present

## 2016-06-23 DIAGNOSIS — R278 Other lack of coordination: Secondary | ICD-10-CM | POA: Diagnosis not present

## 2016-06-23 DIAGNOSIS — R262 Difficulty in walking, not elsewhere classified: Secondary | ICD-10-CM | POA: Diagnosis not present

## 2016-06-23 DIAGNOSIS — R002 Palpitations: Secondary | ICD-10-CM | POA: Diagnosis not present

## 2016-06-23 DIAGNOSIS — M6281 Muscle weakness (generalized): Secondary | ICD-10-CM | POA: Diagnosis not present

## 2016-06-23 DIAGNOSIS — I10 Essential (primary) hypertension: Secondary | ICD-10-CM | POA: Diagnosis not present

## 2016-06-23 DIAGNOSIS — W010XXD Fall on same level from slipping, tripping and stumbling without subsequent striking against object, subsequent encounter: Secondary | ICD-10-CM | POA: Diagnosis not present

## 2016-06-23 DIAGNOSIS — T82510D Breakdown (mechanical) of surgically created arteriovenous fistula, subsequent encounter: Secondary | ICD-10-CM

## 2016-06-27 ENCOUNTER — Other Ambulatory Visit: Payer: Self-pay | Admitting: *Deleted

## 2016-06-27 DIAGNOSIS — R230 Cyanosis: Secondary | ICD-10-CM

## 2016-06-28 ENCOUNTER — Ambulatory Visit: Payer: PPO | Admitting: Family

## 2016-06-28 ENCOUNTER — Other Ambulatory Visit (HOSPITAL_COMMUNITY): Payer: PPO

## 2016-06-28 ENCOUNTER — Encounter: Payer: Self-pay | Admitting: Family

## 2016-06-28 DIAGNOSIS — N186 End stage renal disease: Secondary | ICD-10-CM | POA: Diagnosis not present

## 2016-06-28 DIAGNOSIS — I10 Essential (primary) hypertension: Secondary | ICD-10-CM | POA: Diagnosis not present

## 2016-06-28 DIAGNOSIS — Q798 Other congenital malformations of musculoskeletal system: Secondary | ICD-10-CM | POA: Diagnosis not present

## 2016-06-28 DIAGNOSIS — D631 Anemia in chronic kidney disease: Secondary | ICD-10-CM | POA: Diagnosis not present

## 2016-06-28 DIAGNOSIS — R2681 Unsteadiness on feet: Secondary | ICD-10-CM | POA: Diagnosis not present

## 2016-06-30 ENCOUNTER — Inpatient Hospital Stay (HOSPITAL_COMMUNITY)
Admission: RE | Admit: 2016-06-30 | Discharge: 2016-06-30 | Disposition: A | Discharge status: Home / Self Care | Payer: PPO | Source: Ambulatory Visit | Attending: Vascular Surgery | Admitting: Vascular Surgery

## 2016-06-30 ENCOUNTER — Ambulatory Visit (INDEPENDENT_AMBULATORY_CARE_PROVIDER_SITE_OTHER): Payer: PPO | Admitting: Family

## 2016-06-30 ENCOUNTER — Encounter: Payer: Self-pay | Admitting: Family

## 2016-06-30 VITALS — BP 153/71 | HR 64 | Temp 98.5°F | Resp 20 | Ht 60.0 in | Wt 126.0 lb

## 2016-06-30 DIAGNOSIS — I770 Arteriovenous fistula, acquired: Secondary | ICD-10-CM | POA: Diagnosis not present

## 2016-06-30 DIAGNOSIS — N186 End stage renal disease: Secondary | ICD-10-CM | POA: Diagnosis not present

## 2016-06-30 DIAGNOSIS — Q872 Congenital malformation syndromes predominantly involving limbs: Secondary | ICD-10-CM

## 2016-06-30 DIAGNOSIS — M24521 Contracture, right elbow: Secondary | ICD-10-CM | POA: Diagnosis not present

## 2016-06-30 DIAGNOSIS — R230 Cyanosis: Secondary | ICD-10-CM

## 2016-06-30 DIAGNOSIS — Z992 Dependence on renal dialysis: Secondary | ICD-10-CM

## 2016-06-30 NOTE — Progress Notes (Signed)
Established Dialysis Access  History of Present Illness  Alexis Washington is a 82 y.o. (July 15, 1932) female patient of Dr. Bridgett Larsson who is s/p R 2nd BRVT (Date: 12/15/15).  She returns today at the request of her nephrologist, Dr. Lorrene Reid, for difficulty consistently cannulating the right upper arm AVF. Pt also reports right thumb turning blue about a week ago, which seems to have resolved in the last few days.  She also reports that the HD staff are removing clots from her access. She also still has the right upper chest tunneled dialysis catheter in place, and the pt states that this is used occasionally when the staff are not able to cannulate the right upper arm AVF.   She dialyzes at the [facility] HD center on MWF.    Dr. Bridgett Larsson last saw pt on 03/09/16. At that time Dr. Bridgett Larsson marked the fistula's route on this patient's arm.  Dr. Bridgett Larsson suspected part of the difficulty has been the limitation in arm rotation due to this patient's elbow contracture.  As she has already had a successful HD session, this suggests cannulation technique rather function of the fistula as the issue.  Given the episode of possible steal previously, Dr. Bridgett Larsson has concerns that revision of the distal anastomosis which is tapered, might result in future steal from the right hand.    Past Medical History:  Diagnosis Date  . Anemia in chronic renal disease    ESRD- Sees Dr Justin Mend  . Arthritis   . Benign lipomatous neoplasm of kidney   . Chronic kidney disease, stage V ([facility])   . Complication of anesthesia 1998   "blood pressure dropped low during surgery and after surgery it went up really high."   . Diverticulitis   . GERD (gastroesophageal reflux disease)   . Heart murmur    PCP- said it is nothing to be concerned  . HOH (hard of hearing)   . Hyperkalemia   . Hyperphosphatemia   . Hypertension   . Hypertensive kidney disease   . Hypothyroidism   . Nail-patella syndrome   . PONV (postoperative nausea and vomiting)    . Proteinuria   . Shortness of breath dyspnea     Social History Social History  Substance Use Topics  . Smoking status: Never Smoker  . Smokeless tobacco: Never Used  . Alcohol use No    Family History Family History  Problem Relation Age of Onset  . Hypertension Mother   . Heart disease Father     before age 44  . Hyperlipidemia Father   . Hypertension Father   . Heart attack Father     Surgical History Past Surgical History:  Procedure Laterality Date  . ABDOMINAL HYSTERECTOMY    . APPENDECTOMY    . AV FISTULA PLACEMENT Right 08/10/2015   Procedure: Right Arm Arteriovenous Brachio-cephalic Fistula ;  Surgeon: Conrad Inverness, MD;  Location: [place];  Service: Vascular;  Laterality: Right;  . BASCILIC VEIN TRANSPOSITION Right 12/15/2015   Procedure: SECOND STAGE Right Arm BRACHIAL VEIN TRANSPOSITION;  Surgeon: Conrad Glade, MD;  Location: [place];  Service: Vascular;  Laterality: Right;  . CATARACT EXTRACTION W/ INTRAOCULAR LENS  IMPLANT, BILATERAL    . CHOLECYSTECTOMY  1998  . COLONOSCOPY    . FISTULA SUPERFICIALIZATION Right 08/10/2015   Procedure: Right First Stage Brachial Transposition;  Surgeon: Conrad Atlanta, MD;  Location: [place];  Service: Vascular;  Laterality: Right;  . INSERTION OF DIALYSIS CATHETER N/A 08/10/2015  Procedure: INSERTION OF Right Internal Jugular DIALYSIS CATHETER;  Surgeon: Conrad [place], MD;  Location: [place];  Service: Vascular;  Laterality: N/A;  . TONSILLECTOMY      Allergies  Allergen Reactions  . Codeine Nausea And Vomiting  . Novocain [Procaine] Palpitations    Current Outpatient Prescriptions  Medication Sig Dispense Refill  . acetaminophen (TYLENOL) 500 MG tablet Take 500 mg by mouth every 6 (six) hours as needed (pain).    Marland Kitchen amLODipine (NORVASC) 5 MG tablet Take 5 mg by mouth daily.    Marland Kitchen aspirin EC 81 MG tablet Take 81 mg by mouth daily.    . Calcium-Vitamin D-Vitamin K (VIACTIV PO) Take 1 Dose by mouth daily. Reported on 03/09/2016      . carvedilol (COREG) 12.5 MG tablet Take 12.5 mg by mouth 2 (two) times daily with a meal.    . Chlorpheniramine-Acetaminophen (CORICIDIN HBP COLD/FLU PO) Take 1 tablet by mouth 2 (two) times daily as needed (allergies).     . Cholecalciferol (VITAMIN D) 2000 UNITS tablet Take 2,000 Units by mouth daily.    Marland Kitchen conjugated estrogens (PREMARIN) vaginal cream Place 1 Applicatorful vaginally 2 (two) times a week. Wednesday and Sunday    . levothyroxine (SYNTHROID, LEVOTHROID) 100 MCG tablet Take 100 mcg by mouth daily before breakfast.     . ondansetron (ZOFRAN) 4 MG tablet Take 4 mg by mouth every 8 (eight) hours as needed for nausea or vomiting.    Marland Kitchen OVER THE COUNTER MEDICATION Place 1 drop into both eyes daily as needed (allergy symptoms). Allergy Relief eye drops    . pantoprazole (PROTONIX) 40 MG tablet Take 40 mg by mouth daily.    . polyethylene glycol (MIRALAX / GLYCOLAX) packet Take 17 g by mouth daily.    Marland Kitchen amLODipine (NORVASC) 2.5 MG tablet Take 2.5 mg by mouth at bedtime.     Marland Kitchen Epoetin Alfa (PROCRIT IJ) Inject as directed once a week. Reported on 03/09/2016    . furosemide (LASIX) 20 MG tablet Take 40 mg by mouth 2 (two) times daily.     Marland Kitchen NITROSTAT 0.4 MG SL tablet Place 0.4 mg under the tongue every 5 (five) minutes as needed for chest pain.     Marland Kitchen OVER THE COUNTER MEDICATION Take 30 mLs by mouth daily. Reported on 11/27/2015    . oxyCODONE-acetaminophen (PERCOCET/ROXICET) 5-325 MG tablet Take 1 tablet by mouth every 4 (four) hours as needed. (Patient not taking: Reported on 03/09/2016) 30 tablet 0   No current facility-administered medications for this visit.      REVIEW OF SYSTEMS: see HPI for pertinent positives and negatives    PHYSICAL EXAMINATION:  Vitals:   06/30/16 1442 06/30/16 1451  BP: (!) 154/71 (!) 153/71  Pulse: 64   Resp: 20   Temp: 98.5 F (36.9 C)   TempSrc: Oral   SpO2: 93%   Weight: 126 lb (57.2 kg)   Height: 5' (1.524 m)    Body mass index is 24.61  kg/m.  General: The patient appears their stated age.   HEENT:  No gross abnormalities Pulmonary: Respirations are non-labored Abdomen: Soft and non-tender with normal pitched bowel sounds. Musculoskeletal: Bilateral elbow contractures at 90 degrees.  Neurologic: No focal weakness or paresthesias are detected. She is hard of hearing.  Skin: There are no ulcer or rashes noted. Psychiatric: The patient has normal affect. Cardiovascular: There is a regular rate and rhythm without a murmur appreciated.  Vascular: Right upper arm AVF with a  palpable thrill. Right radial pulse is 2+ palpable. Right fingers are pink, warm, no signs of cyanosis in her thumb or other fingers.    Outside Studies/Documentation 9 pages of outside documents were reviewed including: referral form, vascular access history, and HD records.  Medical Decision Making  Borghild Thaker is a 82 y.o. female who is s/p R 2nd BRVT (Date: 12/15/15).  She returns today at the request of her nephrologist, Dr. Lorrene Reid, for difficulty consistently cannulating the right upper arm AVF. Pt also reports right thumb turning blue about a week ago, which seems to have resolved in the last few days.  She also reports that the HD staff are removing clots from her access. She also still has the right upper chest tunneled dialysis catheter in place, and the pt states that this is used occasionally when the staff are not able to cannulate the right upper arm AVF.   Dr. Bridgett Larsson states he can offer the patient a conversion to a graft at the present site AVF, but pt states she wants the technicians to take more care about accessing before she has to undergo this procedure.  Pt will notify us if she decides to undergo the above procedure. Follow up prn.  Marquail Bradwell, Sharmon Leyden, RN, MSN, FNP-C [facility] [place] Office: [phone]  06/30/2016, 3:01 PM  Clinic MD: Bridgett Larsson

## 2016-07-05 DIAGNOSIS — M5136 Other intervertebral disc degeneration, lumbar region: Secondary | ICD-10-CM | POA: Diagnosis not present

## 2016-07-05 DIAGNOSIS — M546 Pain in thoracic spine: Secondary | ICD-10-CM | POA: Diagnosis not present

## 2016-07-05 DIAGNOSIS — M9904 Segmental and somatic dysfunction of sacral region: Secondary | ICD-10-CM | POA: Diagnosis not present

## 2016-07-05 DIAGNOSIS — M5137 Other intervertebral disc degeneration, lumbosacral region: Secondary | ICD-10-CM | POA: Diagnosis not present

## 2016-07-05 DIAGNOSIS — M5441 Lumbago with sciatica, right side: Secondary | ICD-10-CM | POA: Diagnosis not present

## 2016-07-05 DIAGNOSIS — M9903 Segmental and somatic dysfunction of lumbar region: Secondary | ICD-10-CM | POA: Diagnosis not present

## 2016-07-05 DIAGNOSIS — M9902 Segmental and somatic dysfunction of thoracic region: Secondary | ICD-10-CM | POA: Diagnosis not present

## 2016-07-06 DIAGNOSIS — N186 End stage renal disease: Secondary | ICD-10-CM | POA: Diagnosis not present

## 2016-07-06 DIAGNOSIS — Q798 Other congenital malformations of musculoskeletal system: Secondary | ICD-10-CM | POA: Diagnosis not present

## 2016-07-06 DIAGNOSIS — R2681 Unsteadiness on feet: Secondary | ICD-10-CM | POA: Diagnosis not present

## 2016-07-06 DIAGNOSIS — R002 Palpitations: Secondary | ICD-10-CM | POA: Diagnosis not present

## 2016-07-06 DIAGNOSIS — I10 Essential (primary) hypertension: Secondary | ICD-10-CM | POA: Diagnosis not present

## 2016-07-06 DIAGNOSIS — R262 Difficulty in walking, not elsewhere classified: Secondary | ICD-10-CM | POA: Diagnosis not present

## 2016-07-06 DIAGNOSIS — M6281 Muscle weakness (generalized): Secondary | ICD-10-CM | POA: Diagnosis not present

## 2016-07-06 DIAGNOSIS — R278 Other lack of coordination: Secondary | ICD-10-CM | POA: Diagnosis not present

## 2016-07-06 DIAGNOSIS — W010XXD Fall on same level from slipping, tripping and stumbling without subsequent striking against object, subsequent encounter: Secondary | ICD-10-CM | POA: Diagnosis not present

## 2016-07-12 ENCOUNTER — Encounter: Payer: Self-pay | Admitting: Vascular Surgery

## 2016-07-12 DIAGNOSIS — M9904 Segmental and somatic dysfunction of sacral region: Secondary | ICD-10-CM | POA: Diagnosis not present

## 2016-07-12 DIAGNOSIS — I129 Hypertensive chronic kidney disease with stage 1 through stage 4 chronic kidney disease, or unspecified chronic kidney disease: Secondary | ICD-10-CM | POA: Diagnosis not present

## 2016-07-12 DIAGNOSIS — M5137 Other intervertebral disc degeneration, lumbosacral region: Secondary | ICD-10-CM | POA: Diagnosis not present

## 2016-07-12 DIAGNOSIS — Z992 Dependence on renal dialysis: Secondary | ICD-10-CM | POA: Diagnosis not present

## 2016-07-12 DIAGNOSIS — M546 Pain in thoracic spine: Secondary | ICD-10-CM | POA: Diagnosis not present

## 2016-07-12 DIAGNOSIS — M5136 Other intervertebral disc degeneration, lumbar region: Secondary | ICD-10-CM | POA: Diagnosis not present

## 2016-07-12 DIAGNOSIS — M9903 Segmental and somatic dysfunction of lumbar region: Secondary | ICD-10-CM | POA: Diagnosis not present

## 2016-07-12 DIAGNOSIS — M5441 Lumbago with sciatica, right side: Secondary | ICD-10-CM | POA: Diagnosis not present

## 2016-07-12 DIAGNOSIS — N186 End stage renal disease: Secondary | ICD-10-CM | POA: Diagnosis not present

## 2016-07-12 DIAGNOSIS — M9902 Segmental and somatic dysfunction of thoracic region: Secondary | ICD-10-CM | POA: Diagnosis not present

## 2016-07-13 DIAGNOSIS — Z452 Encounter for adjustment and management of vascular access device: Secondary | ICD-10-CM | POA: Diagnosis not present

## 2016-07-14 DIAGNOSIS — Z992 Dependence on renal dialysis: Secondary | ICD-10-CM | POA: Diagnosis not present

## 2016-07-14 DIAGNOSIS — N186 End stage renal disease: Secondary | ICD-10-CM | POA: Diagnosis not present

## 2016-07-14 DIAGNOSIS — N269 Renal sclerosis, unspecified: Secondary | ICD-10-CM | POA: Diagnosis not present

## 2016-07-15 DIAGNOSIS — E876 Hypokalemia: Secondary | ICD-10-CM | POA: Diagnosis not present

## 2016-07-15 DIAGNOSIS — D631 Anemia in chronic kidney disease: Secondary | ICD-10-CM | POA: Diagnosis not present

## 2016-07-15 DIAGNOSIS — N186 End stage renal disease: Secondary | ICD-10-CM | POA: Diagnosis not present

## 2016-07-15 DIAGNOSIS — D689 Coagulation defect, unspecified: Secondary | ICD-10-CM | POA: Diagnosis not present

## 2016-07-15 DIAGNOSIS — D509 Iron deficiency anemia, unspecified: Secondary | ICD-10-CM | POA: Diagnosis not present

## 2016-07-19 ENCOUNTER — Encounter: Payer: Self-pay | Admitting: Vascular Surgery

## 2016-07-19 ENCOUNTER — Ambulatory Visit (INDEPENDENT_AMBULATORY_CARE_PROVIDER_SITE_OTHER): Payer: PPO | Admitting: Vascular Surgery

## 2016-07-19 ENCOUNTER — Ambulatory Visit (HOSPITAL_COMMUNITY)
Admission: RE | Admit: 2016-07-19 | Discharge: 2016-07-19 | Disposition: A | Discharge status: Home / Self Care | Payer: PPO | Source: Ambulatory Visit | Attending: Vascular Surgery | Admitting: Vascular Surgery

## 2016-07-19 VITALS — BP 169/81 | HR 66 | Temp 97.6°F | Resp 20 | Ht 60.0 in | Wt 128.5 lb

## 2016-07-19 DIAGNOSIS — Y838 Other surgical procedures as the cause of abnormal reaction of the patient, or of later complication, without mention of misadventure at the time of the procedure: Secondary | ICD-10-CM | POA: Diagnosis not present

## 2016-07-19 DIAGNOSIS — T82510D Breakdown (mechanical) of surgically created arteriovenous fistula, subsequent encounter: Secondary | ICD-10-CM

## 2016-07-19 DIAGNOSIS — I770 Arteriovenous fistula, acquired: Secondary | ICD-10-CM | POA: Diagnosis not present

## 2016-07-19 NOTE — Progress Notes (Signed)
Patient name: Alexis Washington MRN: [id number] DOB: [birth year] Sex: female  REASON FOR VISIT: Evaluation of cyanosis right thumb  HPI: Alexis Washington is a 82 y.o. female her today with her husband for evaluation of cyanosis of her right thumb. She had been seen previously by her nurse practitioner for this. She has a history of right upper arm AV fistula creation with the brachial vein transposition by Dr. Imogene Burn. Easily had her catheter removed and is having cannulas ablation of her fistula with no difficulty. She has had episodes of cyanosis of her thumb. Does report that on one occasion this was her index finger. She does report some numbness and tingling when this occurs but does not have persistent numbness in her hand. She does report that her hand can become cool on dialysis.  Past Medical History:  Diagnosis Date  . Anemia in chronic renal disease    ESRD- Sees Dr Hyman Hopes  . Arthritis   . Benign lipomatous neoplasm of kidney   . Chronic kidney disease, stage V ([facility])   . Complication of anesthesia 1998   "blood pressure dropped low during surgery and after surgery it went up really high."   . Diverticulitis   . GERD (gastroesophageal reflux disease)   . Heart murmur    PCP- said it is nothing to be concerned  . HOH (hard of hearing)   . Hyperkalemia   . Hyperphosphatemia   . Hypertension   . Hypertensive kidney disease   . Hypothyroidism   . Nail-patella syndrome   . PONV (postoperative nausea and vomiting)   . Proteinuria   . Shortness of breath dyspnea     Family History  Problem Relation Age of Onset  . Hypertension Mother   . Heart disease Father     before age 56  . Hyperlipidemia Father   . Hypertension Father   . Heart attack Father     SOCIAL HISTORY: Social History  Substance Use Topics  . Smoking status: Never Smoker  . Smokeless tobacco: Never Used  . Alcohol use No    Allergies  Allergen Reactions  . Codeine Nausea And Vomiting  . Novocain [Procaine]  Palpitations    Current Outpatient Prescriptions  Medication Sig Dispense Refill  . acetaminophen (TYLENOL) 500 MG tablet Take 500 mg by mouth every 6 (six) hours as needed (pain).    Marland Kitchen amLODipine (NORVASC) 2.5 MG tablet Take 2.5 mg by mouth at bedtime.     Marland Kitchen amLODipine (NORVASC) 5 MG tablet Take 5 mg by mouth daily.    Marland Kitchen aspirin EC 81 MG tablet Take 81 mg by mouth daily.    . Calcium-Vitamin D-Vitamin K (VIACTIV PO) Take 1 Dose by mouth daily. Reported on 03/09/2016    . carvedilol (COREG) 12.5 MG tablet Take 12.5 mg by mouth 2 (two) times daily with a meal.    . Chlorpheniramine-Acetaminophen (CORICIDIN HBP COLD/FLU PO) Take 1 tablet by mouth 2 (two) times daily as needed (allergies).     . Cholecalciferol (VITAMIN D) 2000 UNITS tablet Take 2,000 Units by mouth daily.    Marland Kitchen conjugated estrogens (PREMARIN) vaginal cream Place 1 Applicatorful vaginally 2 (two) times a week. Wednesday and Sunday    . Epoetin Alfa (PROCRIT IJ) Inject as directed once a week. Reported on 03/09/2016    . esomeprazole (NEXIUM) 20 MG capsule Take 20 mg by mouth daily at 12 noon.    . furosemide (LASIX) 20 MG tablet Take 40 mg by mouth 2 (  two) times daily.     Marland Kitchen levothyroxine (SYNTHROID, LEVOTHROID) 100 MCG tablet Take 100 mcg by mouth daily before breakfast.     . NITROSTAT 0.4 MG SL tablet Place 0.4 mg under the tongue every 5 (five) minutes as needed for chest pain.     Marland Kitchen ondansetron (ZOFRAN) 4 MG tablet Take 4 mg by mouth every 8 (eight) hours as needed for nausea or vomiting.    Marland Kitchen OVER THE COUNTER MEDICATION Place 1 drop into both eyes daily as needed (allergy symptoms). Allergy Relief eye drops    . OVER THE COUNTER MEDICATION Take 30 mLs by mouth daily. Reported on 11/27/2015    . oxyCODONE-acetaminophen (PERCOCET/ROXICET) 5-325 MG tablet Take 1 tablet by mouth every 4 (four) hours as needed. 30 tablet 0  . polyethylene glycol (MIRALAX / GLYCOLAX) packet Take 17 g by mouth daily.    . pantoprazole (PROTONIX) 40  MG tablet Take 40 mg by mouth daily.     No current facility-administered medications for this visit.     REVIEW OF SYSTEMS:  $RemoveB'[X]'[id number]$  denotes positive finding, $RemoveBeforeDEI'[ ]'[id number]$  denotes negative finding Cardiac  Comments:  Chest pain or chest pressure:    Shortness of breath upon exertion:    Short of breath when lying flat:    Irregular heart rhythm:        Vascular    Pain in calf, thigh, or hip brought on by ambulation:    Pain in feet at night that wakes you up from your sleep:     Blood clot in your veins:    Leg swelling:         Pulmonary    Oxygen at home:    Productive cough:     Wheezing:         Neurologic    Sudden weakness in arms or legs:     Sudden numbness in arms or legs:     Sudden onset of difficulty speaking or slurred speech:    Temporary loss of vision in one eye:     Problems with dizziness:         Gastrointestinal    Blood in stool:     Vomited blood:         Genitourinary    Burning when urinating:     Blood in urine:        Psychiatric    Major depression:         Hematologic    Bleeding problems:    Problems with blood clotting too easily:        Skin    Rashes or ulcers:        Constitutional    Fever or chills:      PHYSICAL EXAM: Vitals:   07/19/16 0915 07/19/16 0920  BP: (!) 178/78 (!) 169/81  Pulse: 66   Resp: 20   Temp: 97.6 F (36.4 C)   TempSrc: Oral   SpO2: 97%   Weight: 128 lb 8 oz (58.3 kg)   Height: 5' (1.524 m)     GENERAL: The patient is a well-nourished female, in no acute distress. The vital signs are documented above. VASCULAR: She does have an easily palpable right radial pulse. There is a slight augmentation by physical exam when compressing her fistula. She does have a very nicely developed right arm fistula with an excellent thrill. PULMONARY: There is good air exchange   MUSCULOSKELETAL: There are no major deformities or cyanosis. Does have contracture of both elbows.  NEUROLOGIC: No focal weakness or paresthesias  are detected. SKIN: There are no ulcers or rashes noted. PSYCHIATRIC: The patient has a normal affect.  DATA:   I did listen to her radial pulse and digital artery of her thumb on the right. These are easily audible with the fistula patent with some slight augmentation with occlusion of her fistula manually  MEDICAL ISSUES:  Do not see any evidence of risk for issue loss in her right hand. Currently she reports that she is able to tolerate this cyanosis. Is having minimal discomfort associated with it. Explain the only option would be ligation of her fistula which may or may not improve this. She keep an eye on this and notify should she develop any tissue loss or progressive pain    Jameela Michna [facility] [phone]

## 2016-07-21 DIAGNOSIS — M546 Pain in thoracic spine: Secondary | ICD-10-CM | POA: Diagnosis not present

## 2016-07-21 DIAGNOSIS — M9904 Segmental and somatic dysfunction of sacral region: Secondary | ICD-10-CM | POA: Diagnosis not present

## 2016-07-21 DIAGNOSIS — M5137 Other intervertebral disc degeneration, lumbosacral region: Secondary | ICD-10-CM | POA: Diagnosis not present

## 2016-07-21 DIAGNOSIS — M9902 Segmental and somatic dysfunction of thoracic region: Secondary | ICD-10-CM | POA: Diagnosis not present

## 2016-07-21 DIAGNOSIS — M5441 Lumbago with sciatica, right side: Secondary | ICD-10-CM | POA: Diagnosis not present

## 2016-07-21 DIAGNOSIS — M9903 Segmental and somatic dysfunction of lumbar region: Secondary | ICD-10-CM | POA: Diagnosis not present

## 2016-07-21 DIAGNOSIS — M5136 Other intervertebral disc degeneration, lumbar region: Secondary | ICD-10-CM | POA: Diagnosis not present

## 2016-08-13 DIAGNOSIS — Z992 Dependence on renal dialysis: Secondary | ICD-10-CM | POA: Diagnosis not present

## 2016-08-13 DIAGNOSIS — N186 End stage renal disease: Secondary | ICD-10-CM | POA: Diagnosis not present

## 2016-08-13 DIAGNOSIS — N269 Renal sclerosis, unspecified: Secondary | ICD-10-CM | POA: Diagnosis not present

## 2016-08-15 DIAGNOSIS — E876 Hypokalemia: Secondary | ICD-10-CM | POA: Diagnosis not present

## 2016-08-15 DIAGNOSIS — D689 Coagulation defect, unspecified: Secondary | ICD-10-CM | POA: Diagnosis not present

## 2016-08-15 DIAGNOSIS — R197 Diarrhea, unspecified: Secondary | ICD-10-CM | POA: Diagnosis not present

## 2016-08-15 DIAGNOSIS — N186 End stage renal disease: Secondary | ICD-10-CM | POA: Diagnosis not present

## 2016-08-15 DIAGNOSIS — Z23 Encounter for immunization: Secondary | ICD-10-CM | POA: Diagnosis not present

## 2016-08-15 DIAGNOSIS — D631 Anemia in chronic kidney disease: Secondary | ICD-10-CM | POA: Diagnosis not present

## 2016-08-18 DIAGNOSIS — M5136 Other intervertebral disc degeneration, lumbar region: Secondary | ICD-10-CM | POA: Diagnosis not present

## 2016-08-18 DIAGNOSIS — M5137 Other intervertebral disc degeneration, lumbosacral region: Secondary | ICD-10-CM | POA: Diagnosis not present

## 2016-08-18 DIAGNOSIS — M546 Pain in thoracic spine: Secondary | ICD-10-CM | POA: Diagnosis not present

## 2016-08-18 DIAGNOSIS — M9902 Segmental and somatic dysfunction of thoracic region: Secondary | ICD-10-CM | POA: Diagnosis not present

## 2016-08-18 DIAGNOSIS — M9903 Segmental and somatic dysfunction of lumbar region: Secondary | ICD-10-CM | POA: Diagnosis not present

## 2016-08-18 DIAGNOSIS — M9904 Segmental and somatic dysfunction of sacral region: Secondary | ICD-10-CM | POA: Diagnosis not present

## 2016-08-18 DIAGNOSIS — M5441 Lumbago with sciatica, right side: Secondary | ICD-10-CM | POA: Diagnosis not present

## 2016-09-08 DIAGNOSIS — N186 End stage renal disease: Secondary | ICD-10-CM | POA: Diagnosis not present

## 2016-09-08 DIAGNOSIS — K5901 Slow transit constipation: Secondary | ICD-10-CM | POA: Diagnosis not present

## 2016-09-08 DIAGNOSIS — R110 Nausea: Secondary | ICD-10-CM | POA: Diagnosis not present

## 2016-09-08 DIAGNOSIS — M79604 Pain in right leg: Secondary | ICD-10-CM | POA: Diagnosis not present

## 2016-09-08 DIAGNOSIS — I10 Essential (primary) hypertension: Secondary | ICD-10-CM | POA: Diagnosis not present

## 2016-09-08 DIAGNOSIS — M79605 Pain in left leg: Secondary | ICD-10-CM | POA: Diagnosis not present

## 2016-09-08 DIAGNOSIS — E038 Other specified hypothyroidism: Secondary | ICD-10-CM | POA: Diagnosis not present

## 2016-09-09 DIAGNOSIS — E038 Other specified hypothyroidism: Secondary | ICD-10-CM | POA: Diagnosis not present

## 2016-09-09 DIAGNOSIS — K591 Functional diarrhea: Secondary | ICD-10-CM | POA: Diagnosis not present

## 2016-09-09 DIAGNOSIS — R1032 Left lower quadrant pain: Secondary | ICD-10-CM | POA: Diagnosis not present

## 2016-09-13 DIAGNOSIS — Z992 Dependence on renal dialysis: Secondary | ICD-10-CM | POA: Diagnosis not present

## 2016-09-13 DIAGNOSIS — N269 Renal sclerosis, unspecified: Secondary | ICD-10-CM | POA: Diagnosis not present

## 2016-09-13 DIAGNOSIS — N186 End stage renal disease: Secondary | ICD-10-CM | POA: Diagnosis not present

## 2016-09-14 DIAGNOSIS — D631 Anemia in chronic kidney disease: Secondary | ICD-10-CM | POA: Diagnosis not present

## 2016-09-14 DIAGNOSIS — B370 Candidal stomatitis: Secondary | ICD-10-CM | POA: Diagnosis not present

## 2016-09-14 DIAGNOSIS — D689 Coagulation defect, unspecified: Secondary | ICD-10-CM | POA: Diagnosis not present

## 2016-09-14 DIAGNOSIS — N186 End stage renal disease: Secondary | ICD-10-CM | POA: Diagnosis not present

## 2016-09-14 DIAGNOSIS — E876 Hypokalemia: Secondary | ICD-10-CM | POA: Diagnosis not present

## 2016-09-27 DIAGNOSIS — M545 Low back pain: Secondary | ICD-10-CM | POA: Diagnosis not present

## 2016-09-27 DIAGNOSIS — W1849XA Other slipping, tripping and stumbling without falling, initial encounter: Secondary | ICD-10-CM | POA: Diagnosis not present

## 2016-10-13 DIAGNOSIS — N186 End stage renal disease: Secondary | ICD-10-CM | POA: Diagnosis not present

## 2016-10-13 DIAGNOSIS — Z992 Dependence on renal dialysis: Secondary | ICD-10-CM | POA: Diagnosis not present

## 2016-10-13 DIAGNOSIS — N269 Renal sclerosis, unspecified: Secondary | ICD-10-CM | POA: Diagnosis not present

## 2016-10-14 DIAGNOSIS — N186 End stage renal disease: Secondary | ICD-10-CM | POA: Diagnosis not present

## 2016-10-14 DIAGNOSIS — D689 Coagulation defect, unspecified: Secondary | ICD-10-CM | POA: Diagnosis not present

## 2016-10-14 DIAGNOSIS — D631 Anemia in chronic kidney disease: Secondary | ICD-10-CM | POA: Diagnosis not present

## 2016-10-14 DIAGNOSIS — E876 Hypokalemia: Secondary | ICD-10-CM | POA: Diagnosis not present

## 2016-11-13 DIAGNOSIS — Z992 Dependence on renal dialysis: Secondary | ICD-10-CM | POA: Diagnosis not present

## 2016-11-13 DIAGNOSIS — N186 End stage renal disease: Secondary | ICD-10-CM | POA: Diagnosis not present

## 2016-11-13 DIAGNOSIS — N269 Renal sclerosis, unspecified: Secondary | ICD-10-CM | POA: Diagnosis not present

## 2016-11-16 DIAGNOSIS — N186 End stage renal disease: Secondary | ICD-10-CM | POA: Diagnosis not present

## 2016-11-16 DIAGNOSIS — D689 Coagulation defect, unspecified: Secondary | ICD-10-CM | POA: Diagnosis not present

## 2016-11-16 DIAGNOSIS — E876 Hypokalemia: Secondary | ICD-10-CM | POA: Diagnosis not present

## 2016-11-16 DIAGNOSIS — D631 Anemia in chronic kidney disease: Secondary | ICD-10-CM | POA: Diagnosis not present

## 2016-11-23 DIAGNOSIS — J301 Allergic rhinitis due to pollen: Secondary | ICD-10-CM | POA: Diagnosis not present

## 2016-11-23 DIAGNOSIS — K5901 Slow transit constipation: Secondary | ICD-10-CM | POA: Diagnosis not present

## 2016-11-23 DIAGNOSIS — R12 Heartburn: Secondary | ICD-10-CM | POA: Diagnosis not present

## 2016-12-14 DIAGNOSIS — N269 Renal sclerosis, unspecified: Secondary | ICD-10-CM | POA: Diagnosis not present

## 2016-12-14 DIAGNOSIS — Z992 Dependence on renal dialysis: Secondary | ICD-10-CM | POA: Diagnosis not present

## 2016-12-14 DIAGNOSIS — N186 End stage renal disease: Secondary | ICD-10-CM | POA: Diagnosis not present

## 2016-12-16 DIAGNOSIS — D631 Anemia in chronic kidney disease: Secondary | ICD-10-CM | POA: Diagnosis not present

## 2016-12-16 DIAGNOSIS — N186 End stage renal disease: Secondary | ICD-10-CM | POA: Diagnosis not present

## 2016-12-16 DIAGNOSIS — E876 Hypokalemia: Secondary | ICD-10-CM | POA: Diagnosis not present

## 2016-12-16 DIAGNOSIS — D689 Coagulation defect, unspecified: Secondary | ICD-10-CM | POA: Diagnosis not present

## 2016-12-28 DIAGNOSIS — I700 Atherosclerosis of aorta: Secondary | ICD-10-CM | POA: Diagnosis not present

## 2016-12-28 DIAGNOSIS — J189 Pneumonia, unspecified organism: Secondary | ICD-10-CM | POA: Diagnosis not present

## 2016-12-28 DIAGNOSIS — J9811 Atelectasis: Secondary | ICD-10-CM | POA: Diagnosis not present

## 2016-12-28 DIAGNOSIS — R062 Wheezing: Secondary | ICD-10-CM | POA: Diagnosis not present

## 2016-12-28 DIAGNOSIS — J90 Pleural effusion, not elsewhere classified: Secondary | ICD-10-CM | POA: Diagnosis not present

## 2016-12-28 DIAGNOSIS — R0602 Shortness of breath: Secondary | ICD-10-CM | POA: Diagnosis not present

## 2017-01-04 DIAGNOSIS — Z992 Dependence on renal dialysis: Secondary | ICD-10-CM | POA: Diagnosis not present

## 2017-01-04 DIAGNOSIS — R079 Chest pain, unspecified: Secondary | ICD-10-CM | POA: Insufficient documentation

## 2017-01-04 DIAGNOSIS — R600 Localized edema: Secondary | ICD-10-CM | POA: Diagnosis not present

## 2017-01-04 DIAGNOSIS — I129 Hypertensive chronic kidney disease with stage 1 through stage 4 chronic kidney disease, or unspecified chronic kidney disease: Secondary | ICD-10-CM | POA: Diagnosis not present

## 2017-01-04 DIAGNOSIS — N186 End stage renal disease: Secondary | ICD-10-CM | POA: Diagnosis not present

## 2017-01-05 DIAGNOSIS — E038 Other specified hypothyroidism: Secondary | ICD-10-CM | POA: Diagnosis not present

## 2017-01-05 DIAGNOSIS — R600 Localized edema: Secondary | ICD-10-CM | POA: Diagnosis not present

## 2017-01-05 DIAGNOSIS — I10 Essential (primary) hypertension: Secondary | ICD-10-CM | POA: Diagnosis not present

## 2017-01-05 DIAGNOSIS — Z992 Dependence on renal dialysis: Secondary | ICD-10-CM | POA: Diagnosis not present

## 2017-01-05 DIAGNOSIS — R079 Chest pain, unspecified: Secondary | ICD-10-CM | POA: Diagnosis not present

## 2017-01-05 DIAGNOSIS — Q798 Other congenital malformations of musculoskeletal system: Secondary | ICD-10-CM | POA: Diagnosis not present

## 2017-01-05 DIAGNOSIS — I129 Hypertensive chronic kidney disease with stage 1 through stage 4 chronic kidney disease, or unspecified chronic kidney disease: Secondary | ICD-10-CM | POA: Diagnosis not present

## 2017-01-05 DIAGNOSIS — M79604 Pain in right leg: Secondary | ICD-10-CM | POA: Diagnosis not present

## 2017-01-05 DIAGNOSIS — D631 Anemia in chronic kidney disease: Secondary | ICD-10-CM | POA: Diagnosis not present

## 2017-01-05 DIAGNOSIS — N186 End stage renal disease: Secondary | ICD-10-CM | POA: Diagnosis not present

## 2017-01-05 DIAGNOSIS — R2681 Unsteadiness on feet: Secondary | ICD-10-CM | POA: Diagnosis not present

## 2017-01-05 DIAGNOSIS — K5901 Slow transit constipation: Secondary | ICD-10-CM | POA: Diagnosis not present

## 2017-01-05 DIAGNOSIS — M79605 Pain in left leg: Secondary | ICD-10-CM | POA: Diagnosis not present

## 2017-01-06 DIAGNOSIS — I10 Essential (primary) hypertension: Secondary | ICD-10-CM | POA: Diagnosis not present

## 2017-01-11 DIAGNOSIS — Z992 Dependence on renal dialysis: Secondary | ICD-10-CM | POA: Diagnosis not present

## 2017-01-11 DIAGNOSIS — N269 Renal sclerosis, unspecified: Secondary | ICD-10-CM | POA: Diagnosis not present

## 2017-01-11 DIAGNOSIS — N186 End stage renal disease: Secondary | ICD-10-CM | POA: Diagnosis not present

## 2017-01-13 DIAGNOSIS — N186 End stage renal disease: Secondary | ICD-10-CM | POA: Diagnosis not present

## 2017-01-13 DIAGNOSIS — N959 Unspecified menopausal and perimenopausal disorder: Secondary | ICD-10-CM | POA: Diagnosis not present

## 2017-01-13 DIAGNOSIS — D631 Anemia in chronic kidney disease: Secondary | ICD-10-CM | POA: Diagnosis not present

## 2017-01-13 DIAGNOSIS — E876 Hypokalemia: Secondary | ICD-10-CM | POA: Diagnosis not present

## 2017-01-13 DIAGNOSIS — D689 Coagulation defect, unspecified: Secondary | ICD-10-CM | POA: Diagnosis not present

## 2017-01-13 DIAGNOSIS — M810 Age-related osteoporosis without current pathological fracture: Secondary | ICD-10-CM | POA: Diagnosis not present

## 2017-01-17 DIAGNOSIS — R079 Chest pain, unspecified: Secondary | ICD-10-CM | POA: Diagnosis not present

## 2017-01-17 DIAGNOSIS — R600 Localized edema: Secondary | ICD-10-CM | POA: Diagnosis not present

## 2017-01-25 DIAGNOSIS — Z992 Dependence on renal dialysis: Secondary | ICD-10-CM | POA: Diagnosis not present

## 2017-01-25 DIAGNOSIS — N186 End stage renal disease: Secondary | ICD-10-CM | POA: Diagnosis not present

## 2017-01-25 DIAGNOSIS — I10 Essential (primary) hypertension: Secondary | ICD-10-CM | POA: Diagnosis not present

## 2017-02-11 DIAGNOSIS — N269 Renal sclerosis, unspecified: Secondary | ICD-10-CM | POA: Diagnosis not present

## 2017-02-11 DIAGNOSIS — N186 End stage renal disease: Secondary | ICD-10-CM | POA: Diagnosis not present

## 2017-02-11 DIAGNOSIS — Z992 Dependence on renal dialysis: Secondary | ICD-10-CM | POA: Diagnosis not present

## 2017-02-13 DIAGNOSIS — D631 Anemia in chronic kidney disease: Secondary | ICD-10-CM | POA: Diagnosis not present

## 2017-02-13 DIAGNOSIS — D689 Coagulation defect, unspecified: Secondary | ICD-10-CM | POA: Diagnosis not present

## 2017-02-13 DIAGNOSIS — N186 End stage renal disease: Secondary | ICD-10-CM | POA: Diagnosis not present

## 2017-02-13 DIAGNOSIS — D509 Iron deficiency anemia, unspecified: Secondary | ICD-10-CM | POA: Diagnosis not present

## 2017-02-13 DIAGNOSIS — E876 Hypokalemia: Secondary | ICD-10-CM | POA: Diagnosis not present

## 2017-02-16 DIAGNOSIS — R1310 Dysphagia, unspecified: Secondary | ICD-10-CM | POA: Diagnosis not present

## 2017-02-16 DIAGNOSIS — K5900 Constipation, unspecified: Secondary | ICD-10-CM | POA: Diagnosis not present

## 2017-02-16 DIAGNOSIS — K5730 Diverticulosis of large intestine without perforation or abscess without bleeding: Secondary | ICD-10-CM | POA: Diagnosis not present

## 2017-03-13 DIAGNOSIS — Z992 Dependence on renal dialysis: Secondary | ICD-10-CM | POA: Diagnosis not present

## 2017-03-13 DIAGNOSIS — N186 End stage renal disease: Secondary | ICD-10-CM | POA: Diagnosis not present

## 2017-03-13 DIAGNOSIS — N269 Renal sclerosis, unspecified: Secondary | ICD-10-CM | POA: Diagnosis not present

## 2017-03-15 DIAGNOSIS — D509 Iron deficiency anemia, unspecified: Secondary | ICD-10-CM | POA: Diagnosis not present

## 2017-03-15 DIAGNOSIS — N186 End stage renal disease: Secondary | ICD-10-CM | POA: Diagnosis not present

## 2017-03-15 DIAGNOSIS — D689 Coagulation defect, unspecified: Secondary | ICD-10-CM | POA: Diagnosis not present

## 2017-03-15 DIAGNOSIS — E876 Hypokalemia: Secondary | ICD-10-CM | POA: Diagnosis not present

## 2017-03-15 DIAGNOSIS — D631 Anemia in chronic kidney disease: Secondary | ICD-10-CM | POA: Diagnosis not present

## 2017-03-31 DIAGNOSIS — I120 Hypertensive chronic kidney disease with stage 5 chronic kidney disease or end stage renal disease: Secondary | ICD-10-CM | POA: Diagnosis not present

## 2017-03-31 DIAGNOSIS — N186 End stage renal disease: Secondary | ICD-10-CM | POA: Diagnosis not present

## 2017-03-31 DIAGNOSIS — E860 Dehydration: Secondary | ICD-10-CM | POA: Diagnosis not present

## 2017-03-31 DIAGNOSIS — R42 Dizziness and giddiness: Secondary | ICD-10-CM | POA: Diagnosis not present

## 2017-03-31 DIAGNOSIS — R531 Weakness: Secondary | ICD-10-CM | POA: Diagnosis not present

## 2017-03-31 DIAGNOSIS — Z992 Dependence on renal dialysis: Secondary | ICD-10-CM | POA: Diagnosis not present

## 2017-03-31 DIAGNOSIS — J90 Pleural effusion, not elsewhere classified: Secondary | ICD-10-CM | POA: Diagnosis not present

## 2017-04-13 DIAGNOSIS — I10 Essential (primary) hypertension: Secondary | ICD-10-CM | POA: Diagnosis not present

## 2017-04-13 DIAGNOSIS — N269 Renal sclerosis, unspecified: Secondary | ICD-10-CM | POA: Diagnosis not present

## 2017-04-13 DIAGNOSIS — E038 Other specified hypothyroidism: Secondary | ICD-10-CM | POA: Diagnosis not present

## 2017-04-13 DIAGNOSIS — Z992 Dependence on renal dialysis: Secondary | ICD-10-CM | POA: Diagnosis not present

## 2017-04-13 DIAGNOSIS — D631 Anemia in chronic kidney disease: Secondary | ICD-10-CM | POA: Diagnosis not present

## 2017-04-13 DIAGNOSIS — N186 End stage renal disease: Secondary | ICD-10-CM | POA: Diagnosis not present

## 2017-04-14 DIAGNOSIS — D509 Iron deficiency anemia, unspecified: Secondary | ICD-10-CM | POA: Diagnosis not present

## 2017-04-14 DIAGNOSIS — D689 Coagulation defect, unspecified: Secondary | ICD-10-CM | POA: Diagnosis not present

## 2017-04-14 DIAGNOSIS — N186 End stage renal disease: Secondary | ICD-10-CM | POA: Diagnosis not present

## 2017-04-14 DIAGNOSIS — E876 Hypokalemia: Secondary | ICD-10-CM | POA: Diagnosis not present

## 2017-04-14 DIAGNOSIS — D631 Anemia in chronic kidney disease: Secondary | ICD-10-CM | POA: Diagnosis not present

## 2017-04-20 DIAGNOSIS — R600 Localized edema: Secondary | ICD-10-CM | POA: Diagnosis not present

## 2017-04-20 DIAGNOSIS — I10 Essential (primary) hypertension: Secondary | ICD-10-CM | POA: Diagnosis not present

## 2017-05-09 ENCOUNTER — Encounter (HOSPITAL_COMMUNITY): Payer: Self-pay

## 2017-05-09 ENCOUNTER — Inpatient Hospital Stay (HOSPITAL_COMMUNITY)
Admission: EM | Admit: 2017-05-09 | Discharge: 2017-05-13 | DRG: 189 | Disposition: A | Discharge status: Home Health | Payer: PPO | Attending: Internal Medicine | Admitting: Internal Medicine

## 2017-05-09 DIAGNOSIS — K219 Gastro-esophageal reflux disease without esophagitis: Secondary | ICD-10-CM | POA: Diagnosis present

## 2017-05-09 DIAGNOSIS — K5900 Constipation, unspecified: Secondary | ICD-10-CM | POA: Diagnosis present

## 2017-05-09 DIAGNOSIS — H9190 Unspecified hearing loss, unspecified ear: Secondary | ICD-10-CM | POA: Diagnosis present

## 2017-05-09 DIAGNOSIS — T8089XA Other complications following infusion, transfusion and therapeutic injection, initial encounter: Secondary | ICD-10-CM | POA: Diagnosis not present

## 2017-05-09 DIAGNOSIS — I509 Heart failure, unspecified: Secondary | ICD-10-CM | POA: Diagnosis not present

## 2017-05-09 DIAGNOSIS — Z79899 Other long term (current) drug therapy: Secondary | ICD-10-CM | POA: Diagnosis not present

## 2017-05-09 DIAGNOSIS — I132 Hypertensive heart and chronic kidney disease with heart failure and with stage 5 chronic kidney disease, or end stage renal disease: Secondary | ICD-10-CM | POA: Diagnosis present

## 2017-05-09 DIAGNOSIS — I10 Essential (primary) hypertension: Secondary | ICD-10-CM

## 2017-05-09 DIAGNOSIS — R0600 Dyspnea, unspecified: Secondary | ICD-10-CM | POA: Diagnosis not present

## 2017-05-09 DIAGNOSIS — Z992 Dependence on renal dialysis: Secondary | ICD-10-CM | POA: Diagnosis not present

## 2017-05-09 DIAGNOSIS — Z7982 Long term (current) use of aspirin: Secondary | ICD-10-CM | POA: Diagnosis not present

## 2017-05-09 DIAGNOSIS — N189 Chronic kidney disease, unspecified: Secondary | ICD-10-CM

## 2017-05-09 DIAGNOSIS — J96 Acute respiratory failure: Secondary | ICD-10-CM | POA: Diagnosis not present

## 2017-05-09 DIAGNOSIS — E8889 Other specified metabolic disorders: Secondary | ICD-10-CM | POA: Diagnosis not present

## 2017-05-09 DIAGNOSIS — J90 Pleural effusion, not elsewhere classified: Secondary | ICD-10-CM | POA: Diagnosis not present

## 2017-05-09 DIAGNOSIS — N186 End stage renal disease: Secondary | ICD-10-CM | POA: Diagnosis present

## 2017-05-09 DIAGNOSIS — D631 Anemia in chronic kidney disease: Secondary | ICD-10-CM | POA: Diagnosis not present

## 2017-05-09 DIAGNOSIS — Y92239 Unspecified place in hospital as the place of occurrence of the external cause: Secondary | ICD-10-CM | POA: Diagnosis not present

## 2017-05-09 DIAGNOSIS — R110 Nausea: Secondary | ICD-10-CM

## 2017-05-09 DIAGNOSIS — J9601 Acute respiratory failure with hypoxia: Secondary | ICD-10-CM | POA: Diagnosis not present

## 2017-05-09 DIAGNOSIS — R140 Abdominal distension (gaseous): Secondary | ICD-10-CM | POA: Diagnosis not present

## 2017-05-09 DIAGNOSIS — I313 Pericardial effusion (noninflammatory): Secondary | ICD-10-CM | POA: Diagnosis not present

## 2017-05-09 DIAGNOSIS — Z884 Allergy status to anesthetic agent status: Secondary | ICD-10-CM | POA: Diagnosis not present

## 2017-05-09 DIAGNOSIS — R0902 Hypoxemia: Secondary | ICD-10-CM | POA: Insufficient documentation

## 2017-05-09 DIAGNOSIS — Z885 Allergy status to narcotic agent status: Secondary | ICD-10-CM

## 2017-05-09 DIAGNOSIS — M1990 Unspecified osteoarthritis, unspecified site: Secondary | ICD-10-CM | POA: Diagnosis not present

## 2017-05-09 DIAGNOSIS — N2581 Secondary hyperparathyroidism of renal origin: Secondary | ICD-10-CM | POA: Diagnosis not present

## 2017-05-09 DIAGNOSIS — R112 Nausea with vomiting, unspecified: Secondary | ICD-10-CM | POA: Diagnosis not present

## 2017-05-09 DIAGNOSIS — Y841 Kidney dialysis as the cause of abnormal reaction of the patient, or of later complication, without mention of misadventure at the time of the procedure: Secondary | ICD-10-CM | POA: Diagnosis not present

## 2017-05-09 DIAGNOSIS — Z9889 Other specified postprocedural states: Secondary | ICD-10-CM

## 2017-05-09 DIAGNOSIS — E039 Hypothyroidism, unspecified: Secondary | ICD-10-CM | POA: Diagnosis not present

## 2017-05-09 DIAGNOSIS — R0602 Shortness of breath: Secondary | ICD-10-CM

## 2017-05-09 LAB — BASIC METABOLIC PANEL
ANION GAP: 9 (ref 5–15)
BUN: 35 mg/dL — ABNORMAL HIGH (ref 6–20)
CO2: 26 mmol/L (ref 22–32)
Calcium: 8.6 mg/dL — ABNORMAL LOW (ref 8.9–10.3)
Chloride: 99 mmol/L — ABNORMAL LOW (ref 101–111)
Creatinine, Ser: 5.98 mg/dL — ABNORMAL HIGH (ref 0.44–1.00)
GFR, EST AFRICAN AMERICAN: 7 mL/min — AB (ref >60)
GFR, EST NON AFRICAN AMERICAN: 6 mL/min — AB (ref >60)
GLUCOSE: 116 mg/dL — AB (ref 65–99)
POTASSIUM: 4.2 mmol/L (ref 3.5–5.1)
Sodium: 134 mmol/L — ABNORMAL LOW (ref 135–145)

## 2017-05-09 LAB — BRAIN NATRIURETIC PEPTIDE: B Natriuretic Peptide: 2016 pg/mL — ABNORMAL HIGH (ref 0.0–100.0)

## 2017-05-09 LAB — CBC
HEMATOCRIT: 31.2 % — AB (ref 36.0–46.0)
HEMOGLOBIN: 9.8 g/dL — AB (ref 12.0–15.0)
MCH: 26.3 pg (ref 26.0–34.0)
MCHC: 31.4 g/dL (ref 30.0–36.0)
MCV: 83.9 fL (ref 78.0–100.0)
Platelets: 177 10*3/uL (ref 150–400)
RBC: 3.72 MIL/uL — AB (ref 3.87–5.11)
RDW: 15.8 % — ABNORMAL HIGH (ref 11.5–15.5)
WBC: 4.7 10*3/uL (ref 4.0–10.5)

## 2017-05-09 LAB — PROTIME-INR
INR: 1.06
PROTHROMBIN TIME: 13.9 s (ref 11.4–15.2)

## 2017-05-09 LAB — I-STAT TROPONIN, ED: Troponin i, poc: 0.01 ng/mL (ref 0.00–0.08)

## 2017-05-09 MED ORDER — HYDRALAZINE HCL 20 MG/ML IJ SOLN: 5.0000 mg | INTRAMUSCULAR | Status: DC | PRN

## 2017-05-09 MED ORDER — PANTOPRAZOLE SODIUM 40 MG PO TBEC
40.0000 mg | DELAYED_RELEASE_TABLET | Freq: Every day | ORAL | Status: DC
  Administered 2017-05-10 – 2017-05-13 (×4): 40 mg via ORAL
  Filled 2017-05-09 (×4): qty 1

## 2017-05-09 MED ORDER — RALOXIFENE HCL 60 MG PO TABS
60.0000 mg | ORAL_TABLET | Freq: Every day | ORAL | Status: DC
  Administered 2017-05-10 – 2017-05-13 (×4): 60 mg via ORAL
  Filled 2017-05-09 (×4): qty 1

## 2017-05-09 MED ORDER — ASPIRIN EC 81 MG PO TBEC
81.0000 mg | DELAYED_RELEASE_TABLET | Freq: Every day | ORAL | Status: DC
  Administered 2017-05-10 – 2017-05-13 (×4): 81 mg via ORAL
  Filled 2017-05-09 (×4): qty 1

## 2017-05-09 MED ORDER — SODIUM CHLORIDE 0.9% FLUSH
3.0000 mL | Freq: Two times a day (BID) | INTRAVENOUS | Status: DC
  Administered 2017-05-09 – 2017-05-13 (×6): 3 mL via INTRAVENOUS

## 2017-05-09 MED ORDER — HYDRALAZINE HCL 20 MG/ML IJ SOLN
10.0000 mg | Freq: Once | INTRAMUSCULAR | Status: AC
  Administered 2017-05-09: 10 mg via INTRAVENOUS
  Filled 2017-05-09: qty 1

## 2017-05-09 MED ORDER — VITAMIN D 1000 UNITS PO TABS
2000.0000 [IU] | ORAL_TABLET | Freq: Every day | ORAL | Status: DC
  Administered 2017-05-10 – 2017-05-13 (×4): 2000 [IU] via ORAL
  Filled 2017-05-09 (×4): qty 2

## 2017-05-09 MED ORDER — FUROSEMIDE 10 MG/ML IJ SOLN: 80.0000 mg | Freq: Two times a day (BID) | INTRAMUSCULAR | Status: DC

## 2017-05-09 MED ORDER — CHLORPHENIRAMINE-ACETAMINOPHEN 2-325 MG PO TABS: 1.0000 | ORAL_TABLET | Freq: Two times a day (BID) | ORAL | Status: DC | PRN

## 2017-05-09 MED ORDER — CARVEDILOL 25 MG PO TABS
25.0000 mg | ORAL_TABLET | Freq: Two times a day (BID) | ORAL | Status: DC
  Administered 2017-05-09 – 2017-05-11 (×3): 25 mg via ORAL
  Filled 2017-05-09 (×4): qty 1
  Filled 2017-05-09: qty 2

## 2017-05-09 MED ORDER — SENNOSIDES-DOCUSATE SODIUM 8.6-50 MG PO TABS
1.0000 | ORAL_TABLET | Freq: Every evening | ORAL | Status: DC | PRN
  Administered 2017-05-09: 1 via ORAL
  Filled 2017-05-09: qty 1

## 2017-05-09 MED ORDER — ACETAMINOPHEN 500 MG PO TABS
500.0000 mg | ORAL_TABLET | Freq: Four times a day (QID) | ORAL | Status: DC | PRN
  Administered 2017-05-12 – 2017-05-13 (×3): 500 mg via ORAL
  Filled 2017-05-09 (×3): qty 1

## 2017-05-09 MED ORDER — NITROGLYCERIN 0.4 MG SL SUBL
0.4000 mg | SUBLINGUAL_TABLET | SUBLINGUAL | Status: DC | PRN
Start: 2017-05-09 — End: 2017-05-13

## 2017-05-09 MED ORDER — HEPARIN SODIUM (PORCINE) 5000 UNIT/ML IJ SOLN
5000.0000 [IU] | Freq: Three times a day (TID) | INTRAMUSCULAR | Status: DC
  Administered 2017-05-09 – 2017-05-13 (×8): 5000 [IU] via SUBCUTANEOUS
  Filled 2017-05-09 (×7): qty 1

## 2017-05-09 MED ORDER — POLYETHYLENE GLYCOL 3350 17 G PO PACK
17.0000 g | PACK | Freq: Every day | ORAL | Status: DC
  Administered 2017-05-10 – 2017-05-13 (×4): 17 g via ORAL
  Filled 2017-05-09 (×5): qty 1

## 2017-05-09 MED ORDER — ONDANSETRON HCL 4 MG PO TABS
4.0000 mg | ORAL_TABLET | Freq: Three times a day (TID) | ORAL | Status: DC | PRN
  Administered 2017-05-10 – 2017-05-13 (×5): 4 mg via ORAL
  Filled 2017-05-09 (×5): qty 1

## 2017-05-09 MED ORDER — ZOLPIDEM TARTRATE 5 MG PO TABS: 5.0000 mg | ORAL_TABLET | Freq: Every evening | ORAL | Status: DC | PRN

## 2017-05-09 MED ORDER — LEVOTHYROXINE SODIUM 100 MCG PO TABS
100.0000 ug | ORAL_TABLET | Freq: Every day | ORAL | Status: DC
  Administered 2017-05-10 – 2017-05-13 (×4): 100 ug via ORAL
  Filled 2017-05-09 (×4): qty 1

## 2017-05-09 MED ORDER — OXYCODONE-ACETAMINOPHEN 5-325 MG PO TABS
1.0000 | ORAL_TABLET | ORAL | Status: DC | PRN
  Administered 2017-05-10 – 2017-05-12 (×4): 1 via ORAL
  Filled 2017-05-09 (×3): qty 1

## 2017-05-09 MED ORDER — HYDRALAZINE HCL 50 MG PO TABS
50.0000 mg | ORAL_TABLET | Freq: Three times a day (TID) | ORAL | Status: DC
  Administered 2017-05-09 – 2017-05-13 (×6): 50 mg via ORAL
  Filled 2017-05-09 (×7): qty 1

## 2017-05-09 MED ORDER — AMLODIPINE BESYLATE 10 MG PO TABS
10.0000 mg | ORAL_TABLET | Freq: Every day | ORAL | Status: DC
  Filled 2017-05-09: qty 1

## 2017-05-09 MED ORDER — CALCIUM ACETATE (PHOS BINDER) 667 MG PO CAPS
1334.0000 mg | ORAL_CAPSULE | Freq: Three times a day (TID) | ORAL | Status: DC
  Administered 2017-05-10 – 2017-05-13 (×6): 1334 mg via ORAL
  Filled 2017-05-09 (×8): qty 2

## 2017-05-09 NOTE — ED Notes (Signed)
Pt sent from PCPs office due to complaints of SOB, fatigue, rales to lower and middle lobes. Pts chest xray showed pleural effusions to left lung. Pt has ESRD, followed at [facility]. Pt was dialyzed yesterday.

## 2017-05-09 NOTE — H&P (Signed)
History and Physical    Alexis Washington SAY:[id number] DOB: 08-Dec-1931 DOA: 05/09/2017  Referring MD/NP/PA:   PCP: Rochel Brome, MD (Inactive)   Patient coming from:  The patient is coming from home.  At baseline, pt is independent for most of ADL.   Chief Complaint: SOB  HPI: Alexis Washington is a 83 y.o. female with medical history significant of ESRD-HD (MWF), hypertension, diverticulitis, GERD, hypothyroidism, anemia, white coat syndrome, who presents with shortness breath.  Patient states that she has been having shortness of breath in the past 4 days, which has been progressively getting worse. She does not have chest pain. She has cough with clear mucus production, no fever or chills. Patient also has generalized weakness, but no unilateral weakness, vision change or hearing loss. She has nausea, but no vomiting, diarrhea or abdominal pain. Patient was seen by her PCP today, and had chest x-ray, which showed mild to moderate left-sided pleural effusion. Patient states that she has been compliant to dialysis. She had dialysis yesterday, not sure how much fluid removed. Patient has bilateral lower leg edema. Her renal doctor is Dr. Justin Mend.  ED Course: pt was found to have WBC 4.7, hemoglobin 9.8 which was 12.6 on 12/15/15, negative troponin, potassium 4.2, bicarbonate 26, creatinine 5.98, bili 35, temperature normal, oxygen saturation 91-95%, desaturated to 87% on ambulation. SBP 211 mmHg. Patient is placed on telemetry bed for observation.  Review of Systems:   General: no fevers, chills, has poor appetite, has fatigue HEENT: no blurry vision, hearing changes or sore throat Respiratory: has dyspnea, coughing, no wheezing CV: no chest pain, no palpitations GI: has nausea, no vomiting, abdominal pain, diarrhea, constipation GU: no dysuria, burning on urination, increased urinary frequency, hematuria  Ext: has leg edema Neuro: no unilateral weakness, numbness, or tingling, no vision change or  hearing loss Skin: no rash, no skin tear. MSK: No muscle spasm, no deformity, no limitation of range of movement in spin Heme: No easy bruising.  Travel history: No recent long distant travel.  Allergy:  Allergies  Allergen Reactions  . Codeine Nausea And Vomiting  . Novocain [Procaine] Palpitations    Past Medical History:  Diagnosis Date  . Anemia in chronic renal disease    ESRD- Sees Dr Justin Mend  . Arthritis   . Benign lipomatous neoplasm of kidney   . Chronic kidney disease, stage V ([place])   . Complication of anesthesia 1998   "blood pressure dropped low during surgery and after surgery it went up really high."   . Diverticulitis   . GERD (gastroesophageal reflux disease)   . Heart murmur    PCP- said it is nothing to be concerned  . HOH (hard of hearing)   . Hyperkalemia   . Hyperphosphatemia   . Hypertension   . Hypertensive kidney disease   . Hypothyroidism   . Nail-patella syndrome   . PONV (postoperative nausea and vomiting)   . Proteinuria   . Shortness of breath dyspnea     Past Surgical History:  Procedure Laterality Date  . ABDOMINAL HYSTERECTOMY    . APPENDECTOMY    . AV FISTULA PLACEMENT Right 08/10/2015   Procedure: Right Arm Arteriovenous Brachio-cephalic Fistula ;  Surgeon: Conrad Cochituate, MD;  Location: [facility];  Service: Vascular;  Laterality: Right;  . BASCILIC VEIN TRANSPOSITION Right 12/15/2015   Procedure: SECOND STAGE Right Arm BRACHIAL VEIN TRANSPOSITION;  Surgeon: Conrad Canyon Day, MD;  Location: [place];  Service: Vascular;  Laterality: Right;  .  CATARACT EXTRACTION W/ INTRAOCULAR LENS  IMPLANT, BILATERAL    . CHOLECYSTECTOMY  1998  . COLONOSCOPY    . FISTULA SUPERFICIALIZATION Right 08/10/2015   Procedure: Right First Stage Brachial Transposition;  Surgeon: Conrad Jo Daviess, MD;  Location: [place];  Service: Vascular;  Laterality: Right;  . INSERTION OF DIALYSIS CATHETER N/A 08/10/2015   Procedure: INSERTION OF Right Internal Jugular DIALYSIS CATHETER;   Surgeon: Conrad Llano del Medio, MD;  Location: [facility];  Service: Vascular;  Laterality: N/A;  . TONSILLECTOMY      Social History:  reports that she has never smoked. She has never used smokeless tobacco. She reports that she does not drink alcohol or use drugs.  Family History:  Family History  Problem Relation Age of Onset  . Hypertension Mother   . Heart disease Father        before age 68  . Hyperlipidemia Father   . Hypertension Father   . Heart attack Father      Prior to Admission medications   Medication Sig Start Date End Date Taking? Authorizing Provider  amLODipine (NORVASC) 2.5 MG tablet Take 2.5 mg by mouth at bedtime.    Yes [provider]  aspirin EC 81 MG tablet Take 81 mg by mouth daily.   Yes [provider]  calcium acetate (PHOSLO) 667 MG capsule Take 1,334 mg by mouth 3 (three) times daily.  04/21/17  Yes [provider]  carvedilol (COREG) 25 MG tablet Take 25 mg by mouth 2 (two) times daily with a meal.   Yes [provider]  Chlorpheniramine-Acetaminophen (CORICIDIN HBP COLD/FLU PO) Take 1 tablet by mouth 2 (two) times daily as needed (allergies).    Yes [provider]  Cholecalciferol (VITAMIN D) 2000 UNITS tablet Take 2,000 Units by mouth daily.   Yes [provider]  hydrALAZINE (APRESOLINE) 50 MG tablet Take 50 mg by mouth 2 (two) times daily. 03/27/17  Yes [provider]  hydrochlorothiazide (HYDRODIURIL) 25 MG tablet Take 25 mg by mouth every morning. 04/20/17  Yes [provider]  levothyroxine (SYNTHROID, LEVOTHROID) 100 MCG tablet Take 100 mcg by mouth daily before breakfast.  09/23/15  Yes [provider]  omeprazole (PRILOSEC) 40 MG capsule Take 40 mg by mouth daily.   Yes [provider]  ondansetron (ZOFRAN) 4 MG tablet Take 4 mg by mouth every 8 (eight) hours as needed for nausea or vomiting.   Yes [provider]  OVER THE COUNTER MEDICATION Place 1 drop into both  eyes daily as needed (allergy symptoms). Allergy Relief eye drops   Yes [provider]  raloxifene (EVISTA) 60 MG tablet Take 60 mg by mouth daily. 04/24/17  Yes [provider]  acetaminophen (TYLENOL) 500 MG tablet Take 500 mg by mouth every 6 (six) hours as needed (pain).    [provider]  amLODipine (NORVASC) 5 MG tablet Take 5 mg by mouth daily.    [provider]  Epoetin Alfa (PROCRIT IJ) Inject as directed once a week. Reported on 03/09/2016    [provider]  furosemide (LASIX) 20 MG tablet Take 40 mg by mouth 2 (two) times daily.     [provider]  NITROSTAT 0.4 MG SL tablet Place 0.4 mg under the tongue every 5 (five) minutes as needed for chest pain.  10/23/15   [provider]  oxyCODONE-acetaminophen (PERCOCET/ROXICET) 5-325 MG tablet Take 1 tablet by mouth every 4 (four) hours as needed. Patient not taking: Reported  on 05/09/2017 12/15/15   Ulyses Amor, PA-C    Physical Exam: Vitals:   05/09/17 2000 05/09/17 2001 05/09/17 2030 05/09/17 2115  BP: (!) 203/78  (!) 204/85   Pulse:  80 97   Resp: 17 16 (!) 25   Temp:      TempSrc:      SpO2:  96% 92%   Height:    5' (1.524 m)   General: Not in acute distress HEENT:       Eyes: PERRL, EOMI, no scleral icterus.       ENT: No discharge from the ears and nose, no pharynx injection, no tonsillar enlargement.        Neck: No JVD, no bruit, no mass felt. Heme: No neck lymph node enlargement. Cardiac: G9/J2, RRR, 2/6 systolic murmurs, No gallops or rubs. Respiratory: decreased air movement on the left. No rales, wheezing, rhonchi or rubs. GI: Soft, nondistended, nontender, no rebound pain, no organomegaly, BS present. GU: No hematuria Ext: 2+ pitting leg edema bilaterally. 2+DP/PT pulse bilaterally. Musculoskeletal: No joint deformities, No joint redness or warmth, no limitation of ROM in spin. Skin: No rashes.  Neuro: Alert, oriented X3, cranial nerves II-XII  grossly intact, moves all extremities normally.  Psych: Patient is not psychotic, no suicidal or hemocidal ideation.  Labs on Admission: I have personally reviewed following labs and imaging studies  CBC:  Recent Labs Lab 05/09/17 1456  WBC 4.7  HGB 9.8*  HCT 31.2*  MCV 83.9  PLT 426   Basic Metabolic Panel:  Recent Labs Lab 05/09/17 1456  NA 134*  K 4.2  CL 99*  CO2 26  GLUCOSE 116*  BUN 35*  CREATININE 5.98*  CALCIUM 8.6*   GFR: CrCl cannot be calculated (Unknown ideal weight.). Liver Function Tests: No results for input(s): AST, ALT, ALKPHOS, BILITOT, PROT, ALBUMIN in the last 168 hours. No results for input(s): LIPASE, AMYLASE in the last 168 hours. No results for input(s): AMMONIA in the last 168 hours. Coagulation Profile: No results for input(s): INR, PROTIME in the last 168 hours. Cardiac Enzymes: No results for input(s): CKTOTAL, CKMB, CKMBINDEX, TROPONINI in the last 168 hours. BNP (last 3 results) No results for input(s): PROBNP in the last 8760 hours. HbA1C: No results for input(s): HGBA1C in the last 72 hours. CBG: No results for input(s): GLUCAP in the last 168 hours. Lipid Profile: No results for input(s): CHOL, HDL, LDLCALC, TRIG, CHOLHDL, LDLDIRECT in the last 72 hours. Thyroid Function Tests: No results for input(s): TSH, T4TOTAL, FREET4, T3FREE, THYROIDAB in the last 72 hours. Anemia Panel: No results for input(s): VITAMINB12, FOLATE, FERRITIN, TIBC, IRON, RETICCTPCT in the last 72 hours. Urine analysis: No results found for: COLORURINE, APPEARANCEUR, LABSPEC, PHURINE, GLUCOSEU, HGBUR, BILIRUBINUR, KETONESUR, PROTEINUR, UROBILINOGEN, NITRITE, LEUKOCYTESUR Sepsis Labs: $[email or web address]'[id number]$ (procalcitonin:4,lacticidven:4) )No results found for this or any previous visit (from the past 240 hour(s)).   Radiological Exams on Admission: No results found.   EKG: Independently reviewed.   Sinus rhythm, QTC 457, low voltage, nonspecific T-wave  change.  Assessment/Plan Principal Problem:   Acute respiratory failure with hypoxia (HCC) Active Problems:   ESRD on dialysis ([place])   Hypertension   GERD (gastroesophageal reflux disease)   Anemia in chronic renal disease   Pleural effusion   Hypothyroidism   Acute respiratory failure with hypoxia ([place]): Most likely due to pleural effusion and fluid overload given 2+ bilateral leg edema. No CP or signs of DVT, less likely to have PE. No fever or leukocytosis, unlikely  to have a pneumonia  -will place in tele bed for obs -Need extra HD in AM -nasal canula oxygen -give IV lasix 80 mg bid  Pleural effusion: most likely due to ESRD and fluid retention, but given the unilateral nature, may consider thoracentesis if not improving with dialysis. -need dialysis in morning  ESRD on dialysis [facility]) -left message to renal box for HD in AM -Continue PhosLo and vitamin D  Hypertension: Blood pressure is elevated with SBP 211 mmHg. - increase amlodipine dose from 5 to 10 mg daily -Increase hydralazine dose from 50 bid to 3 times a day -Continue Coreg -IVF hydralazine when necessary   GERD: -Protonix  Anemia in chronic renal disease: hgb 12.6 on 12/15/15-->9.8 -pt is getting Epoetin injection -check anemia panel  Hypothyroidism: Last TSH was not on record -Continue home Synthroid   DVT ppx: SQ Heparin  Code Status: Full code Family Communication:  Yes, patient's husband and daughter at bed side Disposition Plan:  Anticipate discharge back to previous home environment Consults called:  none Admission status: Obs / tele         Date of Service 05/09/2017    Ivor Costa [facility] Pager [phone]  If 7PM-7AM, please contact night-coverage [email or web address] Password [email or web address] 05/09/2017, 9:28 PM

## 2017-05-09 NOTE — ED Notes (Signed)
Main lab to add on BNP

## 2017-05-09 NOTE — ED Provider Notes (Signed)
[facility] [place] DEPT Provider Note   CSN: [id number] Arrival date & time: 05/09/17  1442     History   Chief Complaint Chief Complaint  Patient presents with  . Shortness of Breath    HPI Alexis Washington is a 83 y.o. female.  83 yo F sob this been going on for the past couple weeks. Worsening over the past week or so. She's been discussing this with her dialysis technician who told her the see her family doctor today. While there she had an x-ray that showed she had a left pleural effusion. No history of cancer. Also has been having worsening lower extremity edema. They were sent here because we have dialysis available in the hospital should she need to be admitted. Denies chest pain fevers cough congestion.   The history is provided by the patient.  Illness  This is a new problem. The current episode started more than 1 week ago. The problem occurs constantly. The problem has been gradually worsening. Pertinent negatives include no chest pain, no headaches and no shortness of breath. Nothing aggravates the symptoms. Nothing relieves the symptoms. She has tried nothing for the symptoms. The treatment provided no relief.    Past Medical History:  Diagnosis Date  . Anemia in chronic renal disease    ESRD- Sees Dr Justin Mend  . Arthritis   . Benign lipomatous neoplasm of kidney   . Chronic kidney disease, stage V ([place])   . Complication of anesthesia 1998   "blood pressure dropped low during surgery and after surgery it went up really high."   . Diverticulitis   . GERD (gastroesophageal reflux disease)   . Heart murmur    PCP- said it is nothing to be concerned  . HOH (hard of hearing)   . Hyperkalemia   . Hyperphosphatemia   . Hypertension   . Hypertensive kidney disease   . Hypothyroidism   . Nail-patella syndrome   . PONV (postoperative nausea and vomiting)   . Proteinuria   . Shortness of breath dyspnea     Patient Active Problem List   Diagnosis Date Noted  . Acute  respiratory failure with hypoxia ([place]) 05/09/2017  . Hypertension   . GERD (gastroesophageal reflux disease)   . Anemia in chronic renal disease   . ESRD on dialysis [facility]) 08/07/2015    Past Surgical History:  Procedure Laterality Date  . ABDOMINAL HYSTERECTOMY    . APPENDECTOMY    . AV FISTULA PLACEMENT Right 08/10/2015   Procedure: Right Arm Arteriovenous Brachio-cephalic Fistula ;  Surgeon: Conrad Half Moon, MD;  Location: [place];  Service: Vascular;  Laterality: Right;  . BASCILIC VEIN TRANSPOSITION Right 12/15/2015   Procedure: SECOND STAGE Right Arm BRACHIAL VEIN TRANSPOSITION;  Surgeon: Conrad Laguna Niguel, MD;  Location: [place];  Service: Vascular;  Laterality: Right;  . CATARACT EXTRACTION W/ INTRAOCULAR LENS  IMPLANT, BILATERAL    . CHOLECYSTECTOMY  1998  . COLONOSCOPY    . FISTULA SUPERFICIALIZATION Right 08/10/2015   Procedure: Right First Stage Brachial Transposition;  Surgeon: Conrad Kechi, MD;  Location: [place];  Service: Vascular;  Laterality: Right;  . INSERTION OF DIALYSIS CATHETER N/A 08/10/2015   Procedure: INSERTION OF Right Internal Jugular DIALYSIS CATHETER;  Surgeon: Conrad Green , MD;  Location: [place];  Service: Vascular;  Laterality: N/A;  . TONSILLECTOMY      OB History    No data available       Home Medications    Prior to Admission  medications   Medication Sig Start Date End Date Taking? Authorizing Provider  amLODipine (NORVASC) 2.5 MG tablet Take 2.5 mg by mouth at bedtime.    Yes [provider]  aspirin EC 81 MG tablet Take 81 mg by mouth daily.   Yes [provider]  calcium acetate (PHOSLO) 667 MG capsule Take 1,334 mg by mouth 3 (three) times daily.  04/21/17  Yes [provider]  carvedilol (COREG) 25 MG tablet Take 25 mg by mouth 2 (two) times daily with a meal.   Yes [provider]  Chlorpheniramine-Acetaminophen (CORICIDIN HBP COLD/FLU PO) Take 1 tablet by mouth 2 (two) times daily as needed (allergies).    Yes [provider]  Cholecalciferol (VITAMIN D) 2000 UNITS tablet Take 2,000 Units by mouth daily.   Yes [provider]  hydrALAZINE (APRESOLINE) 50 MG tablet Take 50 mg by mouth 2 (two) times daily. 03/27/17  Yes [provider]  hydrochlorothiazide (HYDRODIURIL) 25 MG tablet Take 25 mg by mouth every morning. 04/20/17  Yes [provider]  levothyroxine (SYNTHROID, LEVOTHROID) 100 MCG tablet Take 100 mcg by mouth daily before breakfast.  09/23/15  Yes [provider]  omeprazole (PRILOSEC) 40 MG capsule Take 40 mg by mouth daily.   Yes [provider]  ondansetron (ZOFRAN) 4 MG tablet Take 4 mg by mouth every 8 (eight) hours as needed for nausea or vomiting.   Yes [provider]  OVER THE COUNTER MEDICATION Place 1 drop into both eyes daily as needed (allergy symptoms). Allergy Relief eye drops   Yes [provider]  raloxifene (EVISTA) 60 MG tablet Take 60 mg by mouth daily. 04/24/17  Yes [provider]  acetaminophen (TYLENOL) 500 MG tablet Take 500 mg by mouth every 6 (six) hours as needed (pain).    [provider]  amLODipine (NORVASC) 5 MG tablet Take 5 mg by mouth daily.    [provider]  Epoetin Alfa (PROCRIT IJ) Inject as directed once a week. Reported on 03/09/2016    [provider]  furosemide (LASIX) 20 MG tablet Take 40 mg by mouth 2 (two) times daily.     [provider]  NITROSTAT 0.4 MG SL tablet Place 0.4 mg under the tongue every 5 (five) minutes as needed for chest pain.  10/23/15   [provider]  oxyCODONE-acetaminophen (PERCOCET/ROXICET) 5-325 MG tablet Take 1 tablet by mouth every 4 (four) hours as needed. Patient not taking: Reported on 05/09/2017 12/15/15   Ulyses Amor, PA-C    Family History Family History  Problem Relation Age of Onset  . Hypertension Mother   . Heart disease Father        before age 78  . Hyperlipidemia Father   . Hypertension  Father   . Heart attack Father     Social History Social History  Substance Use Topics  . Smoking status: Never Smoker  . Smokeless tobacco: Never Used  . Alcohol use No     Allergies   Codeine and Novocain [procaine]   Review of Systems Review of Systems  Constitutional: Negative for chills and fever.  HENT: Negative for congestion and rhinorrhea.   Eyes: Negative for redness and visual disturbance.  Respiratory: Negative for shortness of breath and wheezing.   Cardiovascular: Negative for chest pain and palpitations.  Gastrointestinal: Negative for nausea and vomiting.  Genitourinary: Negative for dysuria and urgency.  Musculoskeletal: Negative for arthralgias and myalgias.  Skin: Negative for pallor and  wound.  Neurological: Negative for dizziness and headaches.     Physical Exam Updated Vital Signs BP (!) 198/79   Pulse 78   Temp 97.4 F (36.3 C) (Oral)   Resp 18   SpO2 91%   Physical Exam  Constitutional: She is oriented to person, place, and time. She appears well-developed and well-nourished. No distress.  HENT:  Head: Normocephalic and atraumatic.  Eyes: EOM are normal. Pupils are equal, round, and reactive to light.  Neck: Normal range of motion. Neck supple.  Cardiovascular: Normal rate and regular rhythm.  Exam reveals no gallop and no friction rub.   No murmur heard. Pulmonary/Chest: Effort normal. She has no wheezes. She has no rales.  Diminished breath sounds in the left lower lobe. Dullness to percussion.  Abdominal: Soft. She exhibits no distension and no mass. There is no tenderness. There is no guarding.  Musculoskeletal: She exhibits edema (2+ shins and below). She exhibits no tenderness.  Neurological: She is alert and oriented to person, place, and time.  Skin: Skin is warm and dry. She is not diaphoretic.  Psychiatric: She has a normal mood and affect. Her behavior is normal.  Nursing note and vitals reviewed.    ED Treatments /  Results  Labs (all labs ordered are listed, but only abnormal results are displayed) Labs Reviewed  BASIC METABOLIC PANEL - Abnormal; Notable for the following:       Result Value   Sodium 134 (*)    Chloride 99 (*)    Glucose, Bld 116 (*)    BUN 35 (*)    Creatinine, Ser 5.98 (*)    Calcium 8.6 (*)    GFR calc non Af Amer 6 (*)    GFR calc Af Amer 7 (*)    All other components within normal limits  CBC - Abnormal; Notable for the following:    RBC 3.72 (*)    Hemoglobin 9.8 (*)    HCT 31.2 (*)    RDW 15.8 (*)    All other components within normal limits  BRAIN NATRIURETIC PEPTIDE  I-STAT TROPOININ, ED    EKG  EKG Interpretation  Date/Time:  Tuesday May 09 2017 14:51:00 EDT Ventricular Rate:  68 PR Interval:  184 QRS Duration: 68 QT Interval:  432 QTC Calculation: 459 R Axis:   11 Text Interpretation:  Normal sinus rhythm Possible Left atrial enlargement Nonspecific ST and T wave abnormality Abnormal ECG No significant change since last tracing Confirmed by Deno Etienne [phone]) on 05/09/2017 4:17:23 PM       Radiology No results found.  Procedures Procedures (including critical care time)  Medications Ordered in ED Medications - No data to display   Initial Impression / Assessment and Plan / ED Course  I have reviewed the triage vital signs and the nursing notes.  Pertinent labs & imaging results that were available during my care of the patient were reviewed by me and considered in my medical decision making (see chart for details).     83 yo F With a chief complaint of shortness of breath. Found to have a mild to moderate size left pleural effusion an outside hospital. My exam she is well-appearing and nontoxic. Labs with no immediate concerning finding. Will attempt to ambulate. The patient was unable to ambulate short distance and became hypoxic. No issues with difficulty breathing while at rest. Discussed with the hospitalist.  The patients results and  plan were reviewed and discussed.   Any x-rays performed were  independently reviewed by myself.   Differential diagnosis were considered with the presenting HPI.  Medications - No data to display  Vitals:   05/09/17 1715 05/09/17 1730 05/09/17 1815 05/09/17 1845  BP: (!) 196/83 (!) 195/76 (!) 198/83 (!) 198/79  Pulse: 69 71 75 78  Resp: 15 (!) 21 (!) 22 18  Temp:      TempSrc:      SpO2: 93% 93% 91% 91%    Final diagnoses:  Essential hypertension  Gastroesophageal reflux disease without esophagitis  Anemia in chronic kidney disease, on chronic dialysis (HCC)  Hypoxia  Pleural effusion    Admission/ observation were discussed with the admitting physician, patient and/or family and they are comfortable with the plan.    Final Clinical Impressions(s) / ED Diagnoses   Final diagnoses:  Essential hypertension  Gastroesophageal reflux disease without esophagitis  Anemia in chronic kidney disease, on chronic dialysis ([place])  Hypoxia  Pleural effusion    New Prescriptions New Prescriptions   No medications on file     Deno Etienne, DO 05/09/17 1933

## 2017-05-09 NOTE — Progress Notes (Signed)
Received report from ED RN. Room ready for patient. Haelie Clapp Joselita, RN 

## 2017-05-09 NOTE — ED Notes (Signed)
While ambulating pt, pt's O2 was between 87% - 89%. Pt stated that she felt "SOB". Pt was brought back to room and placed on room monitor and was at 87%. Informed Cricket - RN about pt's O2 result. Pt placed on 2L of O2 and recorded 94%.

## 2017-05-09 NOTE — ED Notes (Signed)
Dr Niu at bedside 

## 2017-05-09 NOTE — ED Triage Notes (Signed)
Pt sent here by her PCP for pleural effusions on her CXR. Pt reports some shortness of breath X4 days. No distress noted, skin warm and dry. Pt is a MWF dialysis pt.

## 2017-05-09 NOTE — Progress Notes (Signed)
New Admission Note:   Arrival Method: From [facility] ED vis stretcher Mental Orientation: Alert and oriented x4 Telemetry: Box #03- NSR Assessment: Completed Skin: See doc flowsheet IV: L Hand-NSL Pain: Denies Tubes: N/A Safety Measures: Safety Fall Prevention Plan has been given, discussed and signed Admission: Completed [address] [place] Orientation: Patient has been orientated to the room, unit and staff.  Family: Daughter at bedside  Orders have been reviewed and implemented. Will continue to monitor the patient. Call light has been placed within reach and bed alarm has been activated.   [employer], RN-BC Phone number: [phone]

## 2017-05-10 ENCOUNTER — Observation Stay (HOSPITAL_COMMUNITY): Payer: PPO

## 2017-05-10 DIAGNOSIS — R0602 Shortness of breath: Secondary | ICD-10-CM | POA: Diagnosis not present

## 2017-05-10 DIAGNOSIS — Y92239 Unspecified place in hospital as the place of occurrence of the external cause: Secondary | ICD-10-CM | POA: Diagnosis not present

## 2017-05-10 DIAGNOSIS — R140 Abdominal distension (gaseous): Secondary | ICD-10-CM | POA: Diagnosis present

## 2017-05-10 DIAGNOSIS — Z884 Allergy status to anesthetic agent status: Secondary | ICD-10-CM | POA: Diagnosis not present

## 2017-05-10 DIAGNOSIS — N2581 Secondary hyperparathyroidism of renal origin: Secondary | ICD-10-CM | POA: Diagnosis present

## 2017-05-10 DIAGNOSIS — I517 Cardiomegaly: Secondary | ICD-10-CM | POA: Diagnosis not present

## 2017-05-10 DIAGNOSIS — R112 Nausea with vomiting, unspecified: Secondary | ICD-10-CM | POA: Diagnosis not present

## 2017-05-10 DIAGNOSIS — Z885 Allergy status to narcotic agent status: Secondary | ICD-10-CM | POA: Diagnosis not present

## 2017-05-10 DIAGNOSIS — T8089XA Other complications following infusion, transfusion and therapeutic injection, initial encounter: Secondary | ICD-10-CM | POA: Diagnosis not present

## 2017-05-10 DIAGNOSIS — I313 Pericardial effusion (noninflammatory): Secondary | ICD-10-CM | POA: Diagnosis present

## 2017-05-10 DIAGNOSIS — I132 Hypertensive heart and chronic kidney disease with heart failure and with stage 5 chronic kidney disease, or end stage renal disease: Secondary | ICD-10-CM | POA: Diagnosis present

## 2017-05-10 DIAGNOSIS — J9601 Acute respiratory failure with hypoxia: Secondary | ICD-10-CM | POA: Diagnosis present

## 2017-05-10 DIAGNOSIS — I509 Heart failure, unspecified: Secondary | ICD-10-CM | POA: Diagnosis present

## 2017-05-10 DIAGNOSIS — Z79899 Other long term (current) drug therapy: Secondary | ICD-10-CM | POA: Diagnosis not present

## 2017-05-10 DIAGNOSIS — R0600 Dyspnea, unspecified: Secondary | ICD-10-CM | POA: Diagnosis not present

## 2017-05-10 DIAGNOSIS — R110 Nausea: Secondary | ICD-10-CM | POA: Diagnosis not present

## 2017-05-10 DIAGNOSIS — D631 Anemia in chronic kidney disease: Secondary | ICD-10-CM | POA: Diagnosis present

## 2017-05-10 DIAGNOSIS — N269 Renal sclerosis, unspecified: Secondary | ICD-10-CM | POA: Diagnosis not present

## 2017-05-10 DIAGNOSIS — M1990 Unspecified osteoarthritis, unspecified site: Secondary | ICD-10-CM | POA: Diagnosis present

## 2017-05-10 DIAGNOSIS — J90 Pleural effusion, not elsewhere classified: Secondary | ICD-10-CM | POA: Diagnosis present

## 2017-05-10 DIAGNOSIS — K219 Gastro-esophageal reflux disease without esophagitis: Secondary | ICD-10-CM | POA: Diagnosis present

## 2017-05-10 DIAGNOSIS — E8889 Other specified metabolic disorders: Secondary | ICD-10-CM | POA: Diagnosis present

## 2017-05-10 DIAGNOSIS — E039 Hypothyroidism, unspecified: Secondary | ICD-10-CM | POA: Diagnosis present

## 2017-05-10 DIAGNOSIS — Z7982 Long term (current) use of aspirin: Secondary | ICD-10-CM | POA: Diagnosis not present

## 2017-05-10 DIAGNOSIS — R846 Abnormal cytological findings in specimens from respiratory organs and thorax: Secondary | ICD-10-CM | POA: Diagnosis not present

## 2017-05-10 DIAGNOSIS — Y841 Kidney dialysis as the cause of abnormal reaction of the patient, or of later complication, without mention of misadventure at the time of the procedure: Secondary | ICD-10-CM | POA: Diagnosis not present

## 2017-05-10 DIAGNOSIS — N186 End stage renal disease: Secondary | ICD-10-CM | POA: Diagnosis present

## 2017-05-10 DIAGNOSIS — K5900 Constipation, unspecified: Secondary | ICD-10-CM | POA: Diagnosis present

## 2017-05-10 DIAGNOSIS — Z992 Dependence on renal dialysis: Secondary | ICD-10-CM | POA: Diagnosis not present

## 2017-05-10 DIAGNOSIS — H9190 Unspecified hearing loss, unspecified ear: Secondary | ICD-10-CM | POA: Diagnosis present

## 2017-05-10 LAB — BASIC METABOLIC PANEL
Anion gap: 10 (ref 5–15)
BUN: 48 mg/dL — AB (ref 6–20)
CALCIUM: 8.3 mg/dL — AB (ref 8.9–10.3)
CHLORIDE: 100 mmol/L — AB (ref 101–111)
CO2: 24 mmol/L (ref 22–32)
CREATININE: 6.87 mg/dL — AB (ref 0.44–1.00)
GFR calc non Af Amer: 5 mL/min — ABNORMAL LOW (ref >60)
GFR, EST AFRICAN AMERICAN: 6 mL/min — AB (ref >60)
Glucose, Bld: 128 mg/dL — ABNORMAL HIGH (ref 65–99)
Potassium: 4.6 mmol/L (ref 3.5–5.1)
SODIUM: 134 mmol/L — AB (ref 135–145)

## 2017-05-10 LAB — CBC
HCT: 29.5 % — ABNORMAL LOW (ref 36.0–46.0)
Hemoglobin: 9.2 g/dL — ABNORMAL LOW (ref 12.0–15.0)
MCH: 26.5 pg (ref 26.0–34.0)
MCHC: 31.2 g/dL (ref 30.0–36.0)
MCV: 85 fL (ref 78.0–100.0)
PLATELETS: 173 10*3/uL (ref 150–400)
RBC: 3.47 MIL/uL — AB (ref 3.87–5.11)
RDW: 16.6 % — AB (ref 11.5–15.5)
WBC: 4.4 10*3/uL (ref 4.0–10.5)

## 2017-05-10 LAB — RETICULOCYTES
RBC.: 3.42 MIL/uL — ABNORMAL LOW (ref 3.87–5.11)
Retic Count, Absolute: 171 10*3/uL (ref 19.0–186.0)
Retic Ct Pct: 5 % — ABNORMAL HIGH (ref 0.4–3.1)

## 2017-05-10 LAB — FOLATE: Folate: 19.5 ng/mL (ref >5.9)

## 2017-05-10 LAB — FERRITIN: Ferritin: 610 ng/mL — ABNORMAL HIGH (ref 11–307)

## 2017-05-10 LAB — IRON AND TIBC
Iron: 26 ug/dL — ABNORMAL LOW (ref 28–170)
SATURATION RATIOS: 14 % (ref 10.4–31.8)
TIBC: 181 ug/dL — AB (ref 250–450)
UIBC: 155 ug/dL

## 2017-05-10 LAB — MRSA PCR SCREENING: MRSA by PCR: NEGATIVE

## 2017-05-10 LAB — VITAMIN B12: VITAMIN B 12: 386 pg/mL (ref 180–914)

## 2017-05-10 MED ORDER — OXYCODONE-ACETAMINOPHEN 5-325 MG PO TABS
ORAL_TABLET | ORAL | Status: AC
  Filled 2017-05-10: qty 1

## 2017-05-10 NOTE — Procedures (Signed)
  I was present at this dialysis session, have reviewed the session itself and made  appropriate changes Kelly Splinter MD [place] pager [phone]   05/10/2017, 12:57 PM

## 2017-05-10 NOTE — Care Management Obs Status (Signed)
[place] NOTIFICATION   Patient Details  Name: Alexis Washington MRN: [id number] Date of Birth: 1932/09/22   Medicare Observation Status Notification Given:  Yes    Carles Collet, RN 05/10/2017, 2:20 PM

## 2017-05-10 NOTE — Progress Notes (Addendum)
[facility] MWF 3 hours 15 minutes 350/800 53 kg 3.0 K/2.25 Ca UF Profile 4 -Heparin: 1000 units IV TIW -Venofer 50 mg IV q weekly (Last dose 05/03/17) -Mircera 50 mcg IV Q 2 weeks (last dose 05/03/17)   Alexis Washington is an 83 Y/O caucasian female with ESRD on hemodialysis MWF at [facility]. PMH significant for HTN, diverticulitis, hypothyroidism, AOCD, SHPT. She is very HOH. She has been admitted as observation patient with 4 day history of wheezing and SOB. She has not missed any HD treatments, has been getting to EDW. CXR done in [place] will recheck today. Pt has 1+ LE edema, decreased breath sounds, no wheezing. Will have HD today, challenge and lower EDW, increase time to 4 hours. Usually uses 3.0 K bath-K+ 4.6. Use 2.0 K bath today, tight heparin. Give weekly Fe today.   Please notify us if patient status upgraded to in-patient and we will consult formally.  [name] NP-C [place] [phone] (pager)  Pt seen, examined and agree w A/P as above. ESRD on OBS status with SOB and pulm edema, may have lost body wt.  Plan max UF with HD today and reassess dry wt.   Kelly Splinter MD [employer] pager [phone]   05/10/2017, 12:56 PM

## 2017-05-10 NOTE — Progress Notes (Addendum)
Patient ID: Alexis Washington, female   DOB: 06/30/1932, 82 y.o.   MRN: [id number]    PROGRESS NOTE  Alexis Washington  CZY:[id number] DOB: 08-14-1932 DOA: 05/09/2017  PCP: Rochel Brome, MD (Inactive)   Brief Narrative:  83 y.o. female with medical history significant of ESRD-HD (MWF), hypertension, diverticulitis, GERD, hypothyroidism, anemia, white coat syndrome, who presented with shortness breath.  Assessment & Plan:   Acute respiratory failure with hypoxia (HCC) - due to bilateral pleural effusions in the setting of volume overload - HD today and pt still with exertional dyspnea - Ct chest with rather large pleural effusion, will discuss with PCCM  Pleural effusions - most likely due to ESRD and fluid retention - however, after CT chest review it appears that L > R side so will ask IR for thoracentesis with fluid analysis - will also request ECHO as I do see mild pericardial effusion on CT scan  ESRD on dialysis [facility]) - nephrology team following for HD   Hypertension - increased amlodipine dose from 5 to 10 mg daily - increased hydralazine dose from 50 bid to 3 times a day - continue Coreg   GERD: - keep on Protonix  Anemia in chronic renal disease:  - no evidence of active bleeding  - CBC In AM  Hypothyroidism - Continue home Synthroid  DVT prophylaxis: Heparin SQ Code Status: Full  Family Communication: Patient at bedside  Disposition Plan: to be determined   Consultants:   Nephrology for HD  Procedures:   None  Antimicrobials:   None   Subjective: Still rather short of breath.   Objective: Vitals:   05/10/17 1200 05/10/17 1230 05/10/17 1300 05/10/17 1321  BP: (!) 100/50 (!) 105/54 130/62 140/60  Pulse: 64 62 66 72  Resp: $Remo'16 14 17 19  'iblUB$ Temp:    98 F (36.7 C)  TempSrc:    Oral  SpO2:    99%  Weight:    54.8 kg (120 lb 13 oz)  Height:        Intake/Output Summary (Last 24 hours) at 05/10/17 2020 Last data filed at 05/10/17 1321  Gross  per 24 hour  Intake              520 ml  Output             2000 ml  Net            -1480 ml   Filed Weights   05/09/17 2220 05/10/17 0940 05/10/17 1321  Weight: 55.7 kg (122 lb 11.2 oz) 56.8 kg (125 lb 3.5 oz) 54.8 kg (120 lb 13 oz)    Examination:  General exam: Appears calm and comfortable  Respiratory system: Diminished breath sounds at bases, dullness to percussion at bases  Cardiovascular system: S1 & S2 heard, RRR. No JVD, murmurs, rubs, gallops or clicks. No pedal edema. Gastrointestinal system: Abdomen is nondistended, soft and nontender. No organomegaly or masses felt. Normal bowel sounds heard. Central nervous system: Alert and oriented. No focal neurological deficits. Extremities: Symmetric 5 x 5 power.   Data Reviewed: I have personally reviewed following labs and imaging studies  CBC:  Recent Labs Lab 05/09/17 1456 05/10/17 0632  WBC 4.7 4.4  HGB 9.8* 9.2*  HCT 31.2* 29.5*  MCV 83.9 85.0  PLT 177 093   Basic Metabolic Panel:  Recent Labs Lab 05/09/17 1456 05/10/17 0632  NA 134* 134*  K 4.2 4.6  CL 99* 100*  CO2 26 24  GLUCOSE 116*  128*  BUN 35* 48*  CREATININE 5.98* 6.87*  CALCIUM 8.6* 8.3*   Coagulation Profile:  Recent Labs Lab 05/09/17 2200  INR 1.06   Anemia Panel:  Recent Labs  05/10/17 0632  VITAMINB12 386  FOLATE 19.5  FERRITIN 610*  TIBC 181*  IRON 26*  RETICCTPCT 5.0*   Recent Results (from the past 240 hour(s))  MRSA PCR Screening     Status: None   Collection Time: 05/09/17 11:14 PM  Result Value Ref Range Status   MRSA by PCR NEGATIVE NEGATIVE Final    Comment:        The GeneXpert MRSA Assay (FDA approved for NASAL specimens only), is one component of a comprehensive MRSA colonization surveillance program. It is not intended to diagnose MRSA infection nor to guide or monitor treatment for MRSA infections.       Radiology Studies: Ct Chest Wo Contrast  Result Date: 05/10/2017 CLINICAL DATA:   83 year old female with shortness of breath and bilateral pleural effusions. EXAM: CT CHEST WITHOUT CONTRAST TECHNIQUE: Multidetector CT imaging of the chest was performed following the standard protocol without IV contrast. COMPARISON:  05/10/2017 and prior chest radiographs. 05/01/2009 abdominal CT FINDINGS: Cardiovascular: Cardiomegaly noted. Coronary artery and thoracic aortic atherosclerotic calcifications noted. There is no evidence of thoracic aortic aneurysm. A very small pericardial effusion is noted. Mediastinum/Nodes: No enlarged mediastinal or axillary lymph nodes. Thyroid gland, trachea, and esophagus demonstrate no significant findings. Lungs/Pleura: A moderate to large left pleural effusion and moderate right pleural effusion noted. Bilateral lower lung atelectasis identified, left-greater-than-right. Mild central ground-glass and interstitial opacities are noted which may represent mild edema. There is no evidence of focal mass or pneumothorax. Upper Abdomen: No acute abnormality. A fatty lesion extending off of the right kidney is stable to minimally increased from 2010. Musculoskeletal: No acute abnormality or suspicious focal lesion. IMPRESSION: Bilateral pleural effusions, moderate to large on the left and moderate on the right with bilateral lower lung atelectasis. Mild central ground-glass opacities which may represent mild interstitial edema. Cardiomegaly and coronary artery disease. Aortic Atherosclerosis (ICD10-I70.0). Electronically Signed   By: Margarette Canada M.D.   On: 05/10/2017 17:30   Dg Chest Port 1 View  Result Date: 05/10/2017 CLINICAL DATA:  New onset of shortness of breath. Patient is on chronic dialysis. History of acute respiratory failure, hypertension. EXAM: PORTABLE CHEST 1 VIEW COMPARISON:  PA and lateral chest x-ray of November 27, 2015 FINDINGS: The lungs are well-expanded. There is a moderate size left pleural effusion and small right pleural effusion. The cardiac  silhouette is enlarged. The pulmonary vascularity is engorged and indistinct. The interstitial markings are mildly increased. There is calcification in the wall of the aortic arch. There is multilevel degenerative disc disease of the thoracic spine. IMPRESSION: CHF with pulmonary interstitial edema and bilateral pleural effusions greatest on the left. Thoracic aortic atherosclerosis. Electronically Signed   By: David  Martinique M.D.   On: 05/10/2017 09:27      Scheduled Meds: . amLODipine  10 mg Oral Daily  . aspirin EC  81 mg Oral Daily  . calcium acetate  1,334 mg Oral TID WC  . carvedilol  25 mg Oral BID WC  . cholecalciferol  2,000 Units Oral Daily  . heparin  5,000 Units Subcutaneous Q8H  . hydrALAZINE  50 mg Oral Q8H  . levothyroxine  100 mcg Oral QAC breakfast  . pantoprazole  40 mg Oral Daily  . polyethylene glycol  17 g Oral Daily  . raloxifene  60 mg Oral Daily  . sodium chloride flush  3 mL Intravenous Q12H   Continuous Infusions:   LOS: 0 days   Time spent: 35 minutes  Faye Ramsay, MD [facility] Pager [phone]  If 7PM-7AM, please contact night-coverage [email or web address] Password [email or web address] 05/10/2017, 8:20 PM

## 2017-05-11 ENCOUNTER — Inpatient Hospital Stay (HOSPITAL_COMMUNITY): Payer: PPO

## 2017-05-11 ENCOUNTER — Encounter (HOSPITAL_COMMUNITY): Payer: Self-pay | Admitting: General Surgery

## 2017-05-11 HISTORY — PX: IR THORACENTESIS ASP PLEURAL SPACE W/IMG GUIDE: IMG5380

## 2017-05-11 LAB — AMYLASE, PLEURAL OR PERITONEAL FLUID: AMYLASE FL: 20 U/L

## 2017-05-11 LAB — RENAL FUNCTION PANEL
ANION GAP: 7 (ref 5–15)
Albumin: 2.7 g/dL — ABNORMAL LOW (ref 3.5–5.0)
BUN: 20 mg/dL (ref 6–20)
CALCIUM: 7.8 mg/dL — AB (ref 8.9–10.3)
CO2: 30 mmol/L (ref 22–32)
Chloride: 96 mmol/L — ABNORMAL LOW (ref 101–111)
Creatinine, Ser: 4.84 mg/dL — ABNORMAL HIGH (ref 0.44–1.00)
GFR calc non Af Amer: 8 mL/min — ABNORMAL LOW (ref >60)
GFR, EST AFRICAN AMERICAN: 9 mL/min — AB (ref >60)
Glucose, Bld: 94 mg/dL (ref 65–99)
PHOSPHORUS: 3.9 mg/dL (ref 2.5–4.6)
Potassium: 4 mmol/L (ref 3.5–5.1)
SODIUM: 133 mmol/L — AB (ref 135–145)

## 2017-05-11 LAB — LACTATE DEHYDROGENASE, PLEURAL OR PERITONEAL FLUID: LD FL: 52 U/L — AB (ref 3–23)

## 2017-05-11 LAB — CBC
HCT: 28.3 % — ABNORMAL LOW (ref 36.0–46.0)
HEMOGLOBIN: 8.8 g/dL — AB (ref 12.0–15.0)
MCH: 26.2 pg (ref 26.0–34.0)
MCHC: 31.1 g/dL (ref 30.0–36.0)
MCV: 84.2 fL (ref 78.0–100.0)
Platelets: 164 10*3/uL (ref 150–400)
RBC: 3.36 MIL/uL — AB (ref 3.87–5.11)
RDW: 16.6 % — ABNORMAL HIGH (ref 11.5–15.5)
WBC: 3.9 10*3/uL — ABNORMAL LOW (ref 4.0–10.5)

## 2017-05-11 LAB — PROTEIN, PLEURAL OR PERITONEAL FLUID: Total protein, fluid: <3 g/dL

## 2017-05-11 LAB — ALBUMIN, PLEURAL OR PERITONEAL FLUID

## 2017-05-11 LAB — GLUCOSE, PLEURAL OR PERITONEAL FLUID: GLUCOSE FL: 124 mg/dL

## 2017-05-11 MED ORDER — SENNA 8.6 MG PO TABS
1.0000 | ORAL_TABLET | Freq: Every day | ORAL | Status: DC
  Administered 2017-05-13: 8.6 mg via ORAL
  Filled 2017-05-11 (×2): qty 1

## 2017-05-11 MED ORDER — LIDOCAINE HCL (PF) 1 % IJ SOLN: 5.0000 mL | INTRAMUSCULAR | Status: DC | PRN

## 2017-05-11 MED ORDER — PENTAFLUOROPROP-TETRAFLUOROETH EX AERO: 1.0000 "application " | INHALATION_SPRAY | CUTANEOUS | Status: DC | PRN

## 2017-05-11 MED ORDER — AMLODIPINE BESYLATE 10 MG PO TABS
10.0000 mg | ORAL_TABLET | Freq: Every day | ORAL | Status: DC
  Administered 2017-05-11 – 2017-05-12 (×2): 10 mg via ORAL
  Filled 2017-05-11 (×2): qty 1

## 2017-05-11 MED ORDER — HEPARIN SODIUM (PORCINE) 1000 UNIT/ML DIALYSIS: 1000.0000 [IU] | INTRAMUSCULAR | Status: DC | PRN

## 2017-05-11 MED ORDER — SODIUM CHLORIDE 0.9 % IV SOLN: 100.0000 mL | INTRAVENOUS | Status: DC | PRN

## 2017-05-11 MED ORDER — HEPARIN SODIUM (PORCINE) 1000 UNIT/ML DIALYSIS: 20.0000 [IU]/kg | INTRAMUSCULAR | Status: DC | PRN

## 2017-05-11 MED ORDER — NA FERRIC GLUC CPLX IN SUCROSE 12.5 MG/ML IV SOLN
125.0000 mg | INTRAVENOUS | Status: DC
  Administered 2017-05-12: 125 mg via INTRAVENOUS
  Filled 2017-05-11 (×2): qty 10

## 2017-05-11 MED ORDER — OXYCODONE-ACETAMINOPHEN 5-325 MG PO TABS
ORAL_TABLET | ORAL | Status: AC
  Administered 2017-05-11: 1 via ORAL
  Filled 2017-05-11: qty 1

## 2017-05-11 MED ORDER — LIDOCAINE-PRILOCAINE 2.5-2.5 % EX CREA: 1.0000 "application " | TOPICAL_CREAM | CUTANEOUS | Status: DC | PRN

## 2017-05-11 MED ORDER — SODIUM CHLORIDE 0.9 % IV SOLN: 62.5000 mg | INTRAVENOUS | Status: DC

## 2017-05-11 MED ORDER — POLYETHYLENE GLYCOL 3350 17 G PO PACK: 17.0000 g | PACK | Freq: Every day | ORAL | Status: DC

## 2017-05-11 MED ORDER — ALTEPLASE 2 MG IJ SOLR: 2.0000 mg | Freq: Once | INTRAMUSCULAR | Status: DC | PRN

## 2017-05-11 MED ORDER — AMLODIPINE BESYLATE 5 MG PO TABS: 5.0000 mg | ORAL_TABLET | Freq: Every day | ORAL | Status: DC

## 2017-05-11 MED ORDER — DOCUSATE SODIUM 100 MG PO CAPS
100.0000 mg | ORAL_CAPSULE | Freq: Two times a day (BID) | ORAL | Status: DC
  Administered 2017-05-11 – 2017-05-13 (×3): 100 mg via ORAL
  Filled 2017-05-11 (×4): qty 1

## 2017-05-11 MED ORDER — LIDOCAINE HCL (PF) 1 % IJ SOLN
INTRAMUSCULAR | Status: AC
  Filled 2017-05-11: qty 30

## 2017-05-11 MED ORDER — LIDOCAINE HCL 1 % IJ SOLN
INTRAMUSCULAR | Status: DC | PRN
  Administered 2017-05-11: 10 mL

## 2017-05-11 NOTE — Consult Note (Signed)
           [facility] CM Primary Care Navigator  05/11/2017  Alexis Washington 05-02-1932 [id number]   Went to see patient at the bedside to identify possible discharge needs but she is off the unit.  RN reports that patient is currently at IR (Interventional Radiology) for thoracentesis.  Will attempt to meet with patient at another time when she is available in her room.  For questions, please contact:  Dannielle Huh, BSN, RN- [facility] Primary Care Navigator  Telephone: [phone] [place]

## 2017-05-11 NOTE — Progress Notes (Signed)
Pt. Had an uneventful night. For HD today  Hydralazine  50 mg held this am for HD per Dialysis RN . Report given to [facility] .

## 2017-05-11 NOTE — Consult Note (Signed)
[facility] Renal Consultation Note    Indication for Consultation:  Management of ESRD/hemodialysis; anemia, hypertension/volume and secondary hyperparathyroidism PCP: Dr. Blane Ohara  HPI: Alexis Washington is an 83 Y/O caucasian female with ESRD on hemodialysis MWF at [facility]. PMH significant for HTN, diverticulitis, hypothyroidism, AOCD, SHPT. She is very HOH.   She was admitted as observation patient 05/09/17 with 4 day history of wheezing and SOB. She has not missed any HD treatments, has been getting to EDW. CXR done in [place] showed bilateral pleural effusions. Repeat CXR done 05/10/17 showed CHF with pulmonary interstitial edema and bilateral pleural effusions greatest on the left. She had C/O SOB at rest, +1+ pitting LE edema, decreased breath sounds on admission. She rec'd serial hemodialysis since admission and currently feels much better. Plans for thoracentesis later today per primary.   Past Medical History:  Diagnosis Date  . Anemia in chronic renal disease    ESRD- Sees Dr Hyman Hopes  . Arthritis   . Benign lipomatous neoplasm of kidney   . Chronic kidney disease, stage V (HCC)   . Complication of anesthesia 1998   "blood pressure dropped low during surgery and after surgery it went up really high."   . Diverticulitis   . GERD (gastroesophageal reflux disease)   . Heart murmur    PCP- said it is nothing to be concerned  . HOH (hard of hearing)   . Hyperkalemia   . Hyperphosphatemia   . Hypertension   . Hypertensive kidney disease   . Hypothyroidism   . Nail-patella syndrome   . PONV (postoperative nausea and vomiting)   . Proteinuria   . Shortness of breath dyspnea    Past Surgical History:  Procedure Laterality Date  . ABDOMINAL HYSTERECTOMY    . APPENDECTOMY    . AV FISTULA PLACEMENT Right 08/10/2015   Procedure: Right Arm Arteriovenous Brachio-cephalic Fistula ;  Surgeon: Fransisco Hertz, MD;  Location: [facility] OR;  Service: Vascular;  Laterality: Right;  .  BASCILIC VEIN TRANSPOSITION Right 12/15/2015   Procedure: SECOND STAGE Right Arm BRACHIAL VEIN TRANSPOSITION;  Surgeon: Fransisco Hertz, MD;  Location: [facility] OR;  Service: Vascular;  Laterality: Right;  . CATARACT EXTRACTION W/ INTRAOCULAR LENS  IMPLANT, BILATERAL    . CHOLECYSTECTOMY  1998  . COLONOSCOPY    . FISTULA SUPERFICIALIZATION Right 08/10/2015   Procedure: Right First Stage Brachial Transposition;  Surgeon: Fransisco Hertz, MD;  Location: [facility] OR;  Service: Vascular;  Laterality: Right;  . INSERTION OF DIALYSIS CATHETER N/A 08/10/2015   Procedure: INSERTION OF Right Internal Jugular DIALYSIS CATHETER;  Surgeon: Fransisco Hertz, MD;  Location: [facility] OR;  Service: Vascular;  Laterality: N/A;  . TONSILLECTOMY     Family History  Problem Relation Age of Onset  . Hypertension Mother   . Heart disease Father        before age 29  . Hyperlipidemia Father   . Hypertension Father   . Heart attack Father    Social History:  reports that she has never smoked. She has never used smokeless tobacco. She reports that she does not drink alcohol or use drugs. Allergies  Allergen Reactions  . Codeine Nausea And Vomiting  . Novocain [Procaine] Palpitations   Prior to Admission medications   Medication Sig Start Date End Date Taking? Authorizing Provider  amLODipine (NORVASC) 2.5 MG tablet Take 2.5 mg by mouth at bedtime.    Yes [provider]  aspirin EC 81 MG tablet Take 81 mg  by mouth daily.   Yes [provider]  calcium acetate (PHOSLO) 667 MG capsule Take 1,334 mg by mouth 3 (three) times daily.  04/21/17  Yes [provider]  carvedilol (COREG) 25 MG tablet Take 25 mg by mouth 2 (two) times daily with a meal.   Yes [provider]  Chlorpheniramine-Acetaminophen (CORICIDIN HBP COLD/FLU PO) Take 1 tablet by mouth 2 (two) times daily as needed (allergies).    Yes [provider]  Cholecalciferol (VITAMIN D) 2000 UNITS tablet Take 2,000 Units by mouth daily.   Yes  [provider]  hydrALAZINE (APRESOLINE) 50 MG tablet Take 50 mg by mouth 2 (two) times daily. 03/27/17  Yes [provider]  hydrochlorothiazide (HYDRODIURIL) 25 MG tablet Take 25 mg by mouth every morning. 04/20/17  Yes [provider]  levothyroxine (SYNTHROID, LEVOTHROID) 100 MCG tablet Take 100 mcg by mouth daily before breakfast.  09/23/15  Yes [provider]  omeprazole (PRILOSEC) 40 MG capsule Take 40 mg by mouth daily.   Yes [provider]  ondansetron (ZOFRAN) 4 MG tablet Take 4 mg by mouth every 8 (eight) hours as needed for nausea or vomiting.   Yes [provider]  OVER THE COUNTER MEDICATION Place 1 drop into both eyes daily as needed (allergy symptoms). Allergy Relief eye drops   Yes [provider]  raloxifene (EVISTA) 60 MG tablet Take 60 mg by mouth daily. 04/24/17  Yes [provider]  acetaminophen (TYLENOL) 500 MG tablet Take 500 mg by mouth every 6 (six) hours as needed (pain).    [provider]  amLODipine (NORVASC) 5 MG tablet Take 5 mg by mouth daily.    [provider]  Epoetin Alfa (PROCRIT IJ) Inject as directed once a week. Reported on 03/09/2016    [provider]  furosemide (LASIX) 20 MG tablet Take 40 mg by mouth 2 (two) times daily.     [provider]  NITROSTAT 0.4 MG SL tablet Place 0.4 mg under the tongue every 5 (five) minutes as needed for chest pain.  10/23/15   [provider]  oxyCODONE-acetaminophen (PERCOCET/ROXICET) 5-325 MG tablet Take 1 tablet by mouth every 4 (four) hours as needed. Patient not taking: Reported on 05/09/2017 12/15/15   Ulyses Amor, PA-C   Current Facility-Administered Medications  Medication Dose Route Frequency Provider Last Rate Last Dose  . acetaminophen (TYLENOL) tablet 500 mg  500 mg Oral Q6H PRN Ivor Costa, MD      . amLODipine (NORVASC) tablet 10 mg  10 mg Oral Daily Ivor Costa, MD      . aspirin EC tablet 81  mg  81 mg Oral Daily Ivor Costa, MD   81 mg at 05/11/17 1151  . calcium acetate (PHOSLO) capsule 1,334 mg  1,334 mg Oral TID WC Ivor Costa, MD   1,334 mg at 05/11/17 1151  . carvedilol (COREG) tablet 25 mg  25 mg Oral BID WC Ivor Costa, MD   25 mg at 05/10/17 1830  . cholecalciferol (VITAMIN D) tablet 2,000 Units  2,000 Units Oral Daily Ivor Costa, MD   2,000 Units at 05/11/17 1151  . docusate sodium (COLACE) capsule 100 mg  100 mg Oral BID Amin, Ankit Chirag, MD      . heparin injection 5,000 Units  5,000 Units Subcutaneous Q8H Ivor Costa, MD   5,000 Units at 05/11/17 0535  . hydrALAZINE (APRESOLINE) tablet 50 mg  50 mg Oral Q8H Ivor Costa, MD  Stopped at 05/11/17 0600  . levothyroxine (SYNTHROID, LEVOTHROID) tablet 100 mcg  100 mcg Oral QAC breakfast Ivor Costa, MD   100 mcg at 05/11/17 1151  . lidocaine (PF) (XYLOCAINE) 1 % injection           . lidocaine (XYLOCAINE) 1 % (with pres) injection    PRN Saverio Danker, PA-C   10 mL at 05/11/17 1536  . nitroGLYCERIN (NITROSTAT) SL tablet 0.4 mg  0.4 mg Sublingual Q5 min PRN Ivor Costa, MD      . ondansetron [facility]) tablet 4 mg  4 mg Oral Q8H PRN Ivor Costa, MD   4 mg at 05/10/17 1830  . oxyCODONE-acetaminophen (PERCOCET/ROXICET) 5-325 MG per tablet 1 tablet  1 tablet Oral Q4H PRN Ivor Costa, MD   1 tablet at 05/11/17 0900  . pantoprazole (PROTONIX) EC tablet 40 mg  40 mg Oral Daily Ivor Costa, MD   40 mg at 05/11/17 1151  . polyethylene glycol (MIRALAX / GLYCOLAX) packet 17 g  17 g Oral Daily Ivor Costa, MD   17 g at 05/11/17 1157  . raloxifene (EVISTA) tablet 60 mg  60 mg Oral Daily Ivor Costa, MD   60 mg at 05/11/17 1151  . senna (SENOKOT) tablet 8.6 mg  1 tablet Oral Daily Amin, Ankit Chirag, MD      . senna-docusate (Senokot-S) tablet 1 tablet  1 tablet Oral QHS PRN Ivor Costa, MD   1 tablet at 05/09/17 2246  . sodium chloride flush (NS) 0.9 % injection 3 mL  3 mL Intravenous Q12H Ivor Costa, MD   3 mL at 05/10/17 2218  . zolpidem (AMBIEN)  tablet 5 mg  5 mg Oral QHS PRN Ivor Costa, MD       Labs: Basic Metabolic Panel:  Recent Labs Lab 05/09/17 1456 05/10/17 0632 05/11/17 0428  NA 134* 134* 133*  K 4.2 4.6 4.0  CL 99* 100* 96*  CO2 $Re'26 24 30  'czu$ GLUCOSE 116* 128* 94  BUN 35* 48* 20  CREATININE 5.98* 6.87* 4.84*  CALCIUM 8.6* 8.3* 7.8*  PHOS  --   --  3.9   Liver Function Tests:  Recent Labs Lab 05/11/17 0428  ALBUMIN 2.7*   No results for input(s): LIPASE, AMYLASE in the last 168 hours. No results for input(s): AMMONIA in the last 168 hours. CBC:  Recent Labs Lab 05/09/17 1456 05/10/17 0632 05/11/17 0428  WBC 4.7 4.4 3.9*  HGB 9.8* 9.2* 8.8*  HCT 31.2* 29.5* 28.3*  MCV 83.9 85.0 84.2  PLT 177 173 164   Cardiac Enzymes: No results for input(s): CKTOTAL, CKMB, CKMBINDEX, TROPONINI in the last 168 hours. CBG: No results for input(s): GLUCAP in the last 168 hours. Iron Studies:  Recent Labs  05/10/17 0632  IRON 26*  TIBC 181*  FERRITIN 610*   Studies/Results: Dg Chest 1 View  Result Date: 05/11/2017 CLINICAL DATA:  Status post left-sided thoracentesis EXAM: CHEST 1 VIEW COMPARISON:  Chest x-ray dated May 10, 2017 FINDINGS: The lungs are well-expanded. The volume of fluid on the left has decreased. There is no postprocedure pneumothorax. There is no mediastinal shift. The interstitial markings remain coarse bilaterally. The cardiac silhouette is enlarged. The pulmonary vascularity is not engorged. There is calcification in the wall of the aortic arch. The observed bony thorax exhibits no acute abnormality. IMPRESSION: No postprocedure complication following left-sided thoracentesis. The volume of pleural fluid on the left has decreased. Persistent cardiomegaly with mild pulmonary interstitial prominence. Electronically Signed   By:  David  Martinique M.D.   On: 05/11/2017 15:54   Ct Chest Wo Contrast  Result Date: 05/10/2017 CLINICAL DATA:  83 year old female with shortness of breath and bilateral  pleural effusions. EXAM: CT CHEST WITHOUT CONTRAST TECHNIQUE: Multidetector CT imaging of the chest was performed following the standard protocol without IV contrast. COMPARISON:  05/10/2017 and prior chest radiographs. 05/01/2009 abdominal CT FINDINGS: Cardiovascular: Cardiomegaly noted. Coronary artery and thoracic aortic atherosclerotic calcifications noted. There is no evidence of thoracic aortic aneurysm. A very small pericardial effusion is noted. Mediastinum/Nodes: No enlarged mediastinal or axillary lymph nodes. Thyroid gland, trachea, and esophagus demonstrate no significant findings. Lungs/Pleura: A moderate to large left pleural effusion and moderate right pleural effusion noted. Bilateral lower lung atelectasis identified, left-greater-than-right. Mild central ground-glass and interstitial opacities are noted which may represent mild edema. There is no evidence of focal mass or pneumothorax. Upper Abdomen: No acute abnormality. A fatty lesion extending off of the right kidney is stable to minimally increased from 2010. Musculoskeletal: No acute abnormality or suspicious focal lesion. IMPRESSION: Bilateral pleural effusions, moderate to large on the left and moderate on the right with bilateral lower lung atelectasis. Mild central ground-glass opacities which may represent mild interstitial edema. Cardiomegaly and coronary artery disease. Aortic Atherosclerosis (ICD10-I70.0). Electronically Signed   By: Margarette Canada M.D.   On: 05/10/2017 17:30   Dg Chest Port 1 View  Result Date: 05/10/2017 CLINICAL DATA:  New onset of shortness of breath. Patient is on chronic dialysis. History of acute respiratory failure, hypertension. EXAM: PORTABLE CHEST 1 VIEW COMPARISON:  PA and lateral chest x-ray of November 27, 2015 FINDINGS: The lungs are well-expanded. There is a moderate size left pleural effusion and small right pleural effusion. The cardiac silhouette is enlarged. The pulmonary vascularity is engorged and  indistinct. The interstitial markings are mildly increased. There is calcification in the wall of the aortic arch. There is multilevel degenerative disc disease of the thoracic spine. IMPRESSION: CHF with pulmonary interstitial edema and bilateral pleural effusions greatest on the left. Thoracic aortic atherosclerosis. Electronically Signed   By: David  Martinique M.D.   On: 05/10/2017 09:27    ROS: As per HPI otherwise negative.   Physical Exam: Vitals:   05/11/17 1030 05/11/17 1045 05/11/17 1050 05/11/17 1149  BP: (!) 94/51 (!) 110/55 (!) 134/59 (!) 131/47  Pulse: 69 64 65 67  Resp:  16    Temp:  98 F (36.7 C)  98.1 F (36.7 C)  TempSrc:    Oral  SpO2:    99%  Weight:  50.9 kg (112 lb 3.4 oz)    Height:         General: Frail appearing elderly female in NAD. Head: Normocephalic, atraumatic, sclera non-icteric, mucus membranes are moist Neck: Supple. JVD not elevated. Lungs: Bilateral breath sounds, decreased in bases. No wheezing. No WOB at present.  Heart: RRR with S1 S2. 2/6 systolic M. No JVD.  Abdomen: Soft, non-tender, non-distended with normoactive bowel sounds. No rebound/guarding. No obvious abdominal masses. M-S:  Strength and tone appear normal for age. Lower extremities:without edema or ischemic changes, no open wounds  Neuro: Alert and oriented X 3. Moves all extremities spontaneously. Psych:  Responds to questions appropriately with a normal affect. Dialysis Access: RUA AVF + bruit  [place] MWF 3 hours 15 minutes 350/800 53 kg 3.0 K/2.25 Ca UF Profile 4 -Heparin: 1000 units IV TIW -Venofer 50 mg IV q weekly (Last dose 05/03/17) -Mircera 50 mcg IV Q 2 weeks (last  dose 05/03/17)  Assessment/Plan: 1.  Acute hypoxic respiratory failure/Volume overload: Improving with serial HD. Has bilateral pleural effusions on CXR, Thoracentesis ordered per primary.  2.  ESRD -  MWF [place]. Serial HD 06/27, 06/28. HD tomorrow on schedule. Time increased to 4 hours. Previously on  3.0 K bath at center, using 2.0 K bath here. K+ 4.0 3.  Hypertension/volume  - On amlodipine 5 mg PO q HS and Carvedilol 25 mg PO BID per OP med list. Has been started on hydralazine per primary. BP  controlled. HD today-pre wt 53.4 Net UF 2.5 Post wt 50.9 kg. Will need much lower EDW on DC. 1.5-2 liters UFG tomorrow.  4.  Anemia  - HGB 8.8. Not time for ESA yet. Follow HGB-may need to re-dose ESA.  Fe 26 Tsat 14%. Ferritin 610 Load with Fe X 5 doses.  5.  Metabolic bone disease -  Phos 3.9 Ca 7.8 C Ca 8.84 Continue binders.  6.  Nutrition - Albumin 2.7. Renal diet w/fld restrictions, add prostat, renal vits. 7.    Hypothyroidism: per primary   Rita H. Owens Shark, NP-C 05/11/2017, 4:01 PM  [employer] [phone]  Pt seen, examined and agree w A/P as above.  Kelly Splinter MD [employer] pager [phone]   05/12/2017, 8:26 AM

## 2017-05-11 NOTE — Procedures (Signed)
Ultrasound-guided diagnostic and therapeutic left thoracentesis performed yielding 0.6 liters of very light serous colored fluid. No immediate complications. Follow-up chest x-ray pending.       Kveon Casanas E 3:45 PM 05/11/2017

## 2017-05-11 NOTE — Progress Notes (Signed)
PT Cancellation Note  Patient Details Name: Alexis Washington MRN: [id number] DOB: 1931-12-02   Cancelled Treatment:    Reason Eval/Treat Not Completed: Patient at procedure or test/unavailable. Pt currently in HD. Will check back later as time allows.    Scheryl Marten PT, DPT  [phone]  05/11/2017, 8:23 AM

## 2017-05-11 NOTE — Progress Notes (Signed)
PT Cancellation Note  Patient Details Name: Alexis Washington MRN: [id number] DOB: [birth year]   Cancelled Treatment:    Reason Eval/Treat Not Completed: Medical issues which prohibited therapy. RN reports that pt will be going down for a thoracentesis this afternoon. Will check back when pt is stable for therapy.    Scheryl Marten PT, DPT  [phone]  05/11/2017, 2:37 PM

## 2017-05-11 NOTE — Progress Notes (Signed)
Dialysis treatment completed.  3000 mL ultrafiltrated and net fluid removal 2500 mL.    Patient status unchanged. Lung sounds diminished to ausculation in all fields. No edema. Cardiac: NSR.  Disconnected lines and removed needles.  Pressure held for 10 minutes and band aid/gauze dressing applied.  Report given to bedside RN, Lanelle Bal.

## 2017-05-11 NOTE — Progress Notes (Signed)
PROGRESS NOTE    Alexis Washington  ZJI:[id number] DOB: 07-Apr-1932 DOA: 05/09/2017 PCP: Rochel Brome, MD (Inactive)   Brief Narrative:   83 yo female with pmhx of esrd on HD MWF, HTN, Diverticulitis, GERD, Hypothyroidism, anemia comes to the ed with complaints of SOB. She is found to have b/l Pleural effusion L>R. S/p HD to  Assessment & Plan:   Principal Problem:   Acute respiratory failure with hypoxia (HCC) Active Problems:   ESRD on dialysis ([place])   Hypertension   GERD (gastroesophageal reflux disease)   Anemia in chronic renal disease   Pleural effusion   Hypothyroidism  Acute respiratory distress with hypoxia, improved -Has bilateral pleural effusions left greater than right -Status post hemodialysis today. Saturating well on 2 L nasal cannula and on room air -CT of the chest shows large pleural effusion. Ultrasound guided thoracentesis ordered place  Pleural effusions -Left greater than right. Ultrasound-guided thoracentesis ordered with fluid analysis labs -Echocardiogram ordered results pending  ESRD on hemodialysis -Nephrology team following  Hypertension -Amlodipine 10 mg daily, hydralazine 50 mg 3 times daily. Continue Coreg $RemoveBefore'25mg'cwpwZCdowiwCZ$  bid. Improved today.   GERD continue PPI  Anemia of chronic disease likely secondary to ESRD  Hypothyroidism Continue Synthroid  Constipation -last BM 3 days ago, will add bowel regimen    DVT prophylaxis: Heparin subcutaneous Code Status: Full Family Communication:  Husband at bedside Disposition Plan: Likely discharge at next 24-48 hours  Consultants:   Nephro  Procedures:   None  Antimicrobials:   None   Subjective: Patient states she feels little better today but states she feels little nauseous and has not had a BM in last 3 days.   Objective: Vitals:   05/11/17 1030 05/11/17 1045 05/11/17 1050 05/11/17 1149  BP: (!) 94/51 (!) 110/55 (!) 134/59 (!) 131/47  Pulse: 69 64 65 67  Resp:  16    Temp:  98 F  (36.7 C)  98.1 F (36.7 C)  TempSrc:    Oral  SpO2:    99%  Weight:  50.9 kg (112 lb 3.4 oz)    Height:        Intake/Output Summary (Last 24 hours) at 05/11/17 1417 Last data filed at 05/11/17 1405  Gross per 24 hour  Intake              390 ml  Output             2500 ml  Net            -2110 ml   Filed Weights   05/11/17 0500 05/11/17 0725 05/11/17 1045  Weight: 52.1 kg (114 lb 12.9 oz) 53.4 kg (117 lb 11.6 oz) 50.9 kg (112 lb 3.4 oz)    Examination:  General exam: Appears calm and comfortable  Respiratory system: Diminished BS at the bases L>R Cardiovascular system: S1 & S2 heard, RRR. No JVD, murmurs, rubs, gallops or clicks. No pedal edema. Gastrointestinal system: Abdomen is nondistended, soft and nontender. No organomegaly or masses felt. Normal bowel sounds heard. Central nervous system: Alert and oriented. No focal neurological deficits. Extremities: Symmetric 5 x 5 power. Skin: No rashes, lesions or ulcers Psychiatry: Judgement and insight appear normal. Mood & affect appropriate.     Data Reviewed:   CBC:  Recent Labs Lab 05/09/17 1456 05/10/17 0632 05/11/17 0428  WBC 4.7 4.4 3.9*  HGB 9.8* 9.2* 8.8*  HCT 31.2* 29.5* 28.3*  MCV 83.9 85.0 84.2  PLT 177 173 164   Basic  Metabolic Panel:  Recent Labs Lab 05/09/17 1456 05/10/17 0632 05/11/17 0428  NA 134* 134* 133*  K 4.2 4.6 4.0  CL 99* 100* 96*  CO2 $Re'26 24 30  'YMt$ GLUCOSE 116* 128* 94  BUN 35* 48* 20  CREATININE 5.98* 6.87* 4.84*  CALCIUM 8.6* 8.3* 7.8*  PHOS  --   --  3.9   GFR: Estimated Creatinine Clearance: 6.3 mL/min (A) (by C-G formula based on SCr of 4.84 mg/dL (H)). Liver Function Tests:  Recent Labs Lab 05/11/17 0428  ALBUMIN 2.7*   No results for input(s): LIPASE, AMYLASE in the last 168 hours. No results for input(s): AMMONIA in the last 168 hours. Coagulation Profile:  Recent Labs Lab 05/09/17 2200  INR 1.06   Cardiac Enzymes: No results for input(s): CKTOTAL, CKMB,  CKMBINDEX, TROPONINI in the last 168 hours. BNP (last 3 results) No results for input(s): PROBNP in the last 8760 hours. HbA1C: No results for input(s): HGBA1C in the last 72 hours. CBG: No results for input(s): GLUCAP in the last 168 hours. Lipid Profile: No results for input(s): CHOL, HDL, LDLCALC, TRIG, CHOLHDL, LDLDIRECT in the last 72 hours. Thyroid Function Tests: No results for input(s): TSH, T4TOTAL, FREET4, T3FREE, THYROIDAB in the last 72 hours. Anemia Panel:  Recent Labs  05/10/17 0632  VITAMINB12 386  FOLATE 19.5  FERRITIN 610*  TIBC 181*  IRON 26*  RETICCTPCT 5.0*   Sepsis Labs: No results for input(s): PROCALCITON, LATICACIDVEN in the last 168 hours.  Recent Results (from the past 240 hour(s))  MRSA PCR Screening     Status: None   Collection Time: 05/09/17 11:14 PM  Result Value Ref Range Status   MRSA by PCR NEGATIVE NEGATIVE Final    Comment:        The GeneXpert MRSA Assay (FDA approved for NASAL specimens only), is one component of a comprehensive MRSA colonization surveillance program. It is not intended to diagnose MRSA infection nor to guide or monitor treatment for MRSA infections.          Radiology Studies: Ct Chest Wo Contrast  Result Date: 05/10/2017 CLINICAL DATA:  83 year old female with shortness of breath and bilateral pleural effusions. EXAM: CT CHEST WITHOUT CONTRAST TECHNIQUE: Multidetector CT imaging of the chest was performed following the standard protocol without IV contrast. COMPARISON:  05/10/2017 and prior chest radiographs. 05/01/2009 abdominal CT FINDINGS: Cardiovascular: Cardiomegaly noted. Coronary artery and thoracic aortic atherosclerotic calcifications noted. There is no evidence of thoracic aortic aneurysm. A very small pericardial effusion is noted. Mediastinum/Nodes: No enlarged mediastinal or axillary lymph nodes. Thyroid gland, trachea, and esophagus demonstrate no significant findings. Lungs/Pleura: A moderate to  large left pleural effusion and moderate right pleural effusion noted. Bilateral lower lung atelectasis identified, left-greater-than-right. Mild central ground-glass and interstitial opacities are noted which may represent mild edema. There is no evidence of focal mass or pneumothorax. Upper Abdomen: No acute abnormality. A fatty lesion extending off of the right kidney is stable to minimally increased from 2010. Musculoskeletal: No acute abnormality or suspicious focal lesion. IMPRESSION: Bilateral pleural effusions, moderate to large on the left and moderate on the right with bilateral lower lung atelectasis. Mild central ground-glass opacities which may represent mild interstitial edema. Cardiomegaly and coronary artery disease. Aortic Atherosclerosis (ICD10-I70.0). Electronically Signed   By: Margarette Canada M.D.   On: 05/10/2017 17:30   Dg Chest Port 1 View  Result Date: 05/10/2017 CLINICAL DATA:  New onset of shortness of breath. Patient is on chronic dialysis. History of  acute respiratory failure, hypertension. EXAM: PORTABLE CHEST 1 VIEW COMPARISON:  PA and lateral chest x-ray of November 27, 2015 FINDINGS: The lungs are well-expanded. There is a moderate size left pleural effusion and small right pleural effusion. The cardiac silhouette is enlarged. The pulmonary vascularity is engorged and indistinct. The interstitial markings are mildly increased. There is calcification in the wall of the aortic arch. There is multilevel degenerative disc disease of the thoracic spine. IMPRESSION: CHF with pulmonary interstitial edema and bilateral pleural effusions greatest on the left. Thoracic aortic atherosclerosis. Electronically Signed   By: David  Martinique M.D.   On: 05/10/2017 09:27        Scheduled Meds: . amLODipine  10 mg Oral Daily  . aspirin EC  81 mg Oral Daily  . calcium acetate  1,334 mg Oral TID WC  . carvedilol  25 mg Oral BID WC  . cholecalciferol  2,000 Units Oral Daily  . heparin  5,000  Units Subcutaneous Q8H  . hydrALAZINE  50 mg Oral Q8H  . levothyroxine  100 mcg Oral QAC breakfast  . pantoprazole  40 mg Oral Daily  . polyethylene glycol  17 g Oral Daily  . raloxifene  60 mg Oral Daily  . sodium chloride flush  3 mL Intravenous Q12H   Continuous Infusions:   LOS: 1 day    Time spent: 35 mins    Ankit Arsenio Loader, MD [facility] Pager [phone]   If 7PM-7AM, please contact night-coverage [email or web address] Password [email or web address] 05/11/2017, 2:17 PM

## 2017-05-12 ENCOUNTER — Inpatient Hospital Stay (HOSPITAL_COMMUNITY): Payer: PPO

## 2017-05-12 LAB — CBC
HCT: 31.3 % — ABNORMAL LOW (ref 36.0–46.0)
Hemoglobin: 9.7 g/dL — ABNORMAL LOW (ref 12.0–15.0)
MCH: 26.6 pg (ref 26.0–34.0)
MCHC: 31 g/dL (ref 30.0–36.0)
MCV: 85.8 fL (ref 78.0–100.0)
Platelets: 158 10*3/uL (ref 150–400)
RBC: 3.65 MIL/uL — ABNORMAL LOW (ref 3.87–5.11)
RDW: 16.5 % — ABNORMAL HIGH (ref 11.5–15.5)
WBC: 4.2 K/uL (ref 4.0–10.5)

## 2017-05-12 LAB — RENAL FUNCTION PANEL
Albumin: 2.9 g/dL — ABNORMAL LOW (ref 3.5–5.0)
Anion gap: 7 (ref 5–15)
BUN: 17 mg/dL (ref 6–20)
CO2: 28 mmol/L (ref 22–32)
Calcium: 8.4 mg/dL — ABNORMAL LOW (ref 8.9–10.3)
Chloride: 96 mmol/L — ABNORMAL LOW (ref 101–111)
Creatinine, Ser: 5.57 mg/dL — ABNORMAL HIGH (ref 0.44–1.00)
GFR calc Af Amer: 7 mL/min — ABNORMAL LOW (ref >60)
GFR calc non Af Amer: 6 mL/min — ABNORMAL LOW (ref >60)
Glucose, Bld: 133 mg/dL — ABNORMAL HIGH (ref 65–99)
Phosphorus: 3 mg/dL (ref 2.5–4.6)
Potassium: 3.6 mmol/L (ref 3.5–5.1)
Sodium: 131 mmol/L — ABNORMAL LOW (ref 135–145)

## 2017-05-12 LAB — PH, BODY FLUID: pH, Body Fluid: 8

## 2017-05-12 MED ORDER — SODIUM CHLORIDE 0.9 % IV SOLN: 100.0000 mL | INTRAVENOUS | Status: DC | PRN

## 2017-05-12 MED ORDER — SENNA 8.6 MG PO TABS: 1.0000 | ORAL_TABLET | Freq: Every day | ORAL | 0 refills | Status: DC | PRN

## 2017-05-12 MED ORDER — HEPARIN SODIUM (PORCINE) 1000 UNIT/ML DIALYSIS: 20.0000 [IU]/kg | INTRAMUSCULAR | Status: DC | PRN

## 2017-05-12 MED ORDER — ONDANSETRON HCL 4 MG/2ML IJ SOLN
INTRAMUSCULAR | Status: AC
  Administered 2017-05-12: 4 mg via INTRAVENOUS
  Filled 2017-05-12: qty 2

## 2017-05-12 MED ORDER — LIDOCAINE-PRILOCAINE 2.5-2.5 % EX CREA: 1.0000 "application " | TOPICAL_CREAM | CUTANEOUS | Status: DC | PRN

## 2017-05-12 MED ORDER — ALTEPLASE 2 MG IJ SOLR: 2.0000 mg | Freq: Once | INTRAMUSCULAR | Status: DC | PRN

## 2017-05-12 MED ORDER — HEPARIN SODIUM (PORCINE) 1000 UNIT/ML DIALYSIS: 1000.0000 [IU] | INTRAMUSCULAR | Status: DC | PRN

## 2017-05-12 MED ORDER — ONDANSETRON HCL 4 MG/2ML IJ SOLN
4.0000 mg | Freq: Four times a day (QID) | INTRAMUSCULAR | Status: DC | PRN
  Administered 2017-05-12: 4 mg via INTRAVENOUS

## 2017-05-12 MED ORDER — DOCUSATE SODIUM 100 MG PO CAPS: 100.0000 mg | ORAL_CAPSULE | Freq: Two times a day (BID) | ORAL | 0 refills | Status: AC | PRN

## 2017-05-12 MED ORDER — LIDOCAINE HCL (PF) 1 % IJ SOLN: 5.0000 mL | INTRAMUSCULAR | Status: DC | PRN

## 2017-05-12 MED ORDER — POLYETHYLENE GLYCOL 3350 17 G PO PACK: 17.0000 g | PACK | Freq: Every day | ORAL | 0 refills | Status: AC | PRN

## 2017-05-12 MED ORDER — PENTAFLUOROPROP-TETRAFLUOROETH EX AERO: 1.0000 "application " | INHALATION_SPRAY | CUTANEOUS | Status: DC | PRN

## 2017-05-12 NOTE — Progress Notes (Signed)
[facility] Progress Note   Subjective: "I felt so good this morning! I can't thank you enough for helping me!" Very pleasant, denies SOB at present. Husband at bedside. No issues during night.   Objective Vitals:   05/11/17 1651 05/11/17 2027 05/12/17 0622 05/12/17 0919  BP: (!) 166/68 (!) 122/47 (!) 160/62 (!) 123/44  Pulse: 63 64 73 65  Resp: $Remo'16 16 16   'hvWza$ Temp: 98.7 F (37.1 C) 98.3 F (36.8 C) 99 F (37.2 C) 98.5 F (36.9 C)  TempSrc: Oral Oral Oral Oral  SpO2: 98% 92% 90% 94%  Weight:  52.2 kg (115 lb)    Height:       Physical Exam General: Heart: H7,W2, 2/6 systolic M. No JVD Lungs: CTAB still decreased in bases Abdomen: Active BS Extremities: No LE edema Dialysis Access: RUA AVF shrill bruit lower portion of AVF  Additional Objective Labs: Basic Metabolic Panel:  Recent Labs Lab 05/09/17 1456 05/10/17 0632 05/11/17 0428  NA 134* 134* 133*  K 4.2 4.6 4.0  CL 99* 100* 96*  CO2 $Re'26 24 30  'Tff$ GLUCOSE 116* 128* 94  BUN 35* 48* 20  CREATININE 5.98* 6.87* 4.84*  CALCIUM 8.6* 8.3* 7.8*  PHOS  --   --  3.9   Liver Function Tests:  Recent Labs Lab 05/11/17 0428  ALBUMIN 2.7*   No results for input(s): LIPASE, AMYLASE in the last 168 hours. CBC:  Recent Labs Lab 05/09/17 1456 05/10/17 0632 05/11/17 0428  WBC 4.7 4.4 3.9*  HGB 9.8* 9.2* 8.8*  HCT 31.2* 29.5* 28.3*  MCV 83.9 85.0 84.2  PLT 177 173 164   Blood Culture No results found for: SDES, SPECREQUEST, CULT, REPTSTATUS  CBG: No results for input(s): GLUCAP in the last 168 hours. Iron Studies:  Recent Labs  05/10/17 0632  IRON 26*  TIBC 181*  FERRITIN 610*   '@lablastinr3'$ @ Studies/Results: Dg Chest 1 View  Result Date: 05/11/2017 CLINICAL DATA:  Status post left-sided thoracentesis EXAM: CHEST 1 VIEW COMPARISON:  Chest x-ray dated May 10, 2017 FINDINGS: The lungs are well-expanded. The volume of fluid on the left has decreased. There is no postprocedure pneumothorax.  There is no mediastinal shift. The interstitial markings remain coarse bilaterally. The cardiac silhouette is enlarged. The pulmonary vascularity is not engorged. There is calcification in the wall of the aortic arch. The observed bony thorax exhibits no acute abnormality. IMPRESSION: No postprocedure complication following left-sided thoracentesis. The volume of pleural fluid on the left has decreased. Persistent cardiomegaly with mild pulmonary interstitial prominence. Electronically Signed   By: David  Martinique M.D.   On: 05/11/2017 15:54   Ct Chest Wo Contrast  Result Date: 05/10/2017 CLINICAL DATA:  83 year old female with shortness of breath and bilateral pleural effusions. EXAM: CT CHEST WITHOUT CONTRAST TECHNIQUE: Multidetector CT imaging of the chest was performed following the standard protocol without IV contrast. COMPARISON:  05/10/2017 and prior chest radiographs. 05/01/2009 abdominal CT FINDINGS: Cardiovascular: Cardiomegaly noted. Coronary artery and thoracic aortic atherosclerotic calcifications noted. There is no evidence of thoracic aortic aneurysm. A very small pericardial effusion is noted. Mediastinum/Nodes: No enlarged mediastinal or axillary lymph nodes. Thyroid gland, trachea, and esophagus demonstrate no significant findings. Lungs/Pleura: A moderate to large left pleural effusion and moderate right pleural effusion noted. Bilateral lower lung atelectasis identified, left-greater-than-right. Mild central ground-glass and interstitial opacities are noted which may represent mild edema. There is no evidence of focal mass or pneumothorax. Upper Abdomen: No acute abnormality. A fatty lesion extending off  of the right kidney is stable to minimally increased from 2010. Musculoskeletal: No acute abnormality or suspicious focal lesion. IMPRESSION: Bilateral pleural effusions, moderate to large on the left and moderate on the right with bilateral lower lung atelectasis. Mild central ground-glass  opacities which may represent mild interstitial edema. Cardiomegaly and coronary artery disease. Aortic Atherosclerosis (ICD10-I70.0). Electronically Signed   By: Harmon Pier M.D.   On: 05/10/2017 17:30   Ir Thoracentesis Asp Pleural Space W/img Guide  Result Date: 05/11/2017 INDICATION: Should with a history of end-stage renal disease on hemodialysis with dyspnea. Recent imaging reveals bilateral pleural effusions with the left greater than the right. Request is made for diagnostic and therapeutic thoracentesis. EXAM: ULTRASOUND GUIDED DIAGNOSTIC AND THERAPEUTIC THORACENTESIS MEDICATIONS: 1% lidocaine COMPLICATIONS: None immediate. PROCEDURE: An ultrasound guided thoracentesis was thoroughly discussed with the patient and questions answered. The benefits, risks, alternatives and complications were also discussed. The patient understands and wishes to proceed with the procedure. Written consent was obtained. Ultrasound was performed to localize and mark an adequate pocket of fluid in the left chest as this side had more fluid. The area was then prepped and draped in the normal sterile fashion. 1% Lidocaine was used for local anesthesia. Under ultrasound guidance a Safe-T-Centesis catheter was introduced. Thoracentesis was performed. The catheter was removed and a dressing applied. FINDINGS: A total of approximately 0.6 L of very pale serous fluid was removed. Samples were sent to the laboratory as requested by the clinical team. IMPRESSION: Successful ultrasound guided left thoracentesis yielding 0.6 L of pleural fluid. Read by: Barnetta Chapel, PA-C Electronically Signed   By: Simonne Come M.D.   On: 05/11/2017 16:20   Medications: . ferric gluconate (FERRLECIT/NULECIT) IV     . amLODipine  10 mg Oral QHS  . aspirin EC  81 mg Oral Daily  . calcium acetate  1,334 mg Oral TID WC  . carvedilol  25 mg Oral BID WC  . cholecalciferol  2,000 Units Oral Daily  . docusate sodium  100 mg Oral BID  . heparin   5,000 Units Subcutaneous Q8H  . hydrALAZINE  50 mg Oral Q8H  . levothyroxine  100 mcg Oral QAC breakfast  . pantoprazole  40 mg Oral Daily  . polyethylene glycol  17 g Oral Daily  . raloxifene  60 mg Oral Daily  . senna  1 tablet Oral Daily  . sodium chloride flush  3 mL Intravenous Q12H     [place] MWF 3 hours 15 minutes 350/800 53 kg 3.0 K/2.25 Ca UF Profile 4 -Heparin: 1000 units IV TIW -Venofer 50 mg IV q weekly (Last dose 05/03/17) -Mircera 50 mcg IV Q 2 weeks (last dose 05/03/17)  Assessment/Plan: 1.  Acute hypoxic respiratory failure/Volume overload: Improving with serial HD. Had bilateral pleural effusions on CXR, Thoracentesis yesterday 0.6 liters.  2.  ESRD -  MWF [place]. Serial HD 06/27, 06/28. HD today. Time increased to 4 hours. Previously on 3.0 K bath at center, using 2.0 K bath here. K+ 4.0 3.  Hypertension/volume  - On amlodipine 5 mg PO q HS and Carvedilol 25 mg PO BID per OP med list. Has been started on hydralazine per primary. BP  controlled. HD 05/11/17 -pre wt 53.4 Net UF 2.5 Post wt 50.9 kg. Will need much lower EDW on DC. 1.5-2 liters UFG tomorrow. EDW 50 kg on DC.  4.  Anemia  - HGB 8.8. Not time for ESA yet. Follow HGB.  Fe 26 Tsat 14%. Ferritin 610  Load with Fe X 5 doses.  5.  Metabolic bone disease -  Phos 3.9 Ca 7.8 C Ca 8.84 Continue binders.  6.  Nutrition - Albumin 2.7. Renal diet w/fld restrictions, add prostat, renal vits. 7.    Hypothyroidism: per primary  Rita H. Brown NP-C 05/12/2017, 10:39 AM  [facility] [phone]  Pt seen, examined and agree w A/P as above. OK for dc after HD today, vol down and will be lowering her dry wt.  Educated about fluid restriction importance.  Kelly Splinter MD [employer] pager [phone]   05/12/2017, 11:12 AM

## 2017-05-12 NOTE — Discharge Summary (Addendum)
Physician Discharge Summary  Alexis Washington JSE:[id number] DOB: July 03, 1932 DOA: 05/09/2017  PCP: Rochel Brome, MD  Admit date: 05/09/2017 Discharge date: 05/13/2017  Admitted From: Home Disposition: Home  Recommendations for Outpatient Follow-up:  1. Follow up with PCP in 1-2 weeks 2. Take bowel regimen as needed. Stop or decrease the frequency of taking it if you develop diarrhea. Home with home  Home Health: None Equipment/Devices: None  Discharge Condition: Stable CODE STATUS: Full Diet recommendation: Renal  Brief/Interim Summary: 83 year old female with past medical history of ESRD on hemodialysis Monday Wednesday Friday, hypertension, diverticulitis, GERD, hypothyroidism, anemia of chronic disease came to the ER with complaints of shortness of breath. Upon admission she was found to have bilateral pleural effusions left greater than right. Nephrology was consulted and she was placed on dialysis with some improvement in her symptoms. Also thoracentesis was ordered with fluid analysis which was performed on 05/03/2017. About 0.6 L of fluid was removed and after the patient felt much better. Overall the fluid looked unremarkable without any signs of infections. Today patient feels much better and is at her baseline and is ready to go home. I spoke with Dr. Tessa Lerner from nephrology who recommended that patient will get 1 more session of dialysis and is ready to be discharged after as well from nephrology standpoint. She has reached maximum benefit from in hospital stay and is ready to be discharged with outpatient follow with recommendations above. Due to complaints of constipation I prescribed her bowel regimen and instructed her to stop taking it or decrease frequency if she develops diarrhea. Patient did not end up going yesterday as she started having some nausea and vomiting post HD. AXR showed mild gaseous distention. Had bowel movement overnight. Still has some distention but states she  will hope to take care of it at home as she has bowel movements with bowel regimen.   No complaints this morning. Wants to go home  Discharge Diagnoses:  Principal Problem:   Acute respiratory failure with hypoxia ([place]) Active Problems:   ESRD on dialysis ([place])   Hypertension   GERD (gastroesophageal reflux disease)   Anemia in chronic renal disease   Pleural effusion   Hypothyroidism  Acute respiratory distress with hypoxia, resolved -Has bilateral pleural effusions left greater than right -Status post 2 sessions of hemodialysis in the hospital  -Ultrasound-guided thoracentesis performed on 05/11/2017. 0.6 L of very light serous colored fluid removed.  Pleural effusions -Left greater than right. Status post thoracentesis   ESRD on hemodialysis -Nephrology team following -Received 1 session of hemodialysis yesterday. Plans of 1 more session today and then discharge  Hypertension -Resume  GERD continue PPI  Anemia of chronic disease likely secondary to ESRD  Hypothyroidism Continue Synthroid  Constipation -last BM 3 days ago, bowel regimen given  Discharge Instructions   Allergies as of 05/13/2017      Reactions   Codeine Nausea And Vomiting   Novocain [procaine] Palpitations      Medication List    TAKE these medications   acetaminophen 500 MG tablet Commonly known as:  TYLENOL Take 500 mg by mouth every 6 (six) hours as needed (pain).   amLODipine 5 MG tablet Commonly known as:  NORVASC Take 5 mg by mouth daily. What changed:  Another medication with the same name was removed. Continue taking this medication, and follow the directions you see here.   aspirin EC 81 MG tablet Take 81 mg by mouth daily.   calcium acetate 667 MG capsule  Commonly known as:  PHOSLO Take 1,334 mg by mouth 3 (three) times daily.   carvedilol 25 MG tablet Commonly known as:  COREG Take 25 mg by mouth 2 (two) times daily with a meal.   CORICIDIN HBP COLD/FLU  PO Take 1 tablet by mouth 2 (two) times daily as needed (allergies).   docusate sodium 100 MG capsule Commonly known as:  COLACE Take 1 capsule (100 mg total) by mouth 2 (two) times daily as needed for mild constipation.   furosemide 20 MG tablet Commonly known as:  LASIX Take 40 mg by mouth 2 (two) times daily.   hydrALAZINE 50 MG tablet Commonly known as:  APRESOLINE Take 50 mg by mouth 2 (two) times daily.   hydrochlorothiazide 25 MG tablet Commonly known as:  HYDRODIURIL Take 25 mg by mouth every morning.   levothyroxine 100 MCG tablet Commonly known as:  SYNTHROID, LEVOTHROID Take 100 mcg by mouth daily before breakfast.   NITROSTAT 0.4 MG SL tablet Generic drug:  nitroGLYCERIN Place 0.4 mg under the tongue every 5 (five) minutes as needed for chest pain.   omeprazole 40 MG capsule Commonly known as:  PRILOSEC Take 40 mg by mouth daily.   ondansetron 4 MG disintegrating tablet Commonly known as:  ZOFRAN ODT Take 1 tablet (4 mg total) by mouth every 8 (eight) hours as needed for nausea or vomiting.   ondansetron 4 MG tablet Commonly known as:  ZOFRAN Take 4 mg by mouth every 8 (eight) hours as needed for nausea or vomiting.   OVER THE COUNTER MEDICATION Place 1 drop into both eyes daily as needed (allergy symptoms). Allergy Relief eye drops   oxyCODONE-acetaminophen 5-325 MG tablet Commonly known as:  PERCOCET/ROXICET Take 1 tablet by mouth every 4 (four) hours as needed.   polyethylene glycol packet Commonly known as:  MIRALAX / GLYCOLAX Take 17 g by mouth daily as needed.   PROCRIT IJ Inject as directed once a week. Reported on 03/09/2016   raloxifene 60 MG tablet Commonly known as:  EVISTA Take 60 mg by mouth daily.   senna 8.6 MG Tabs tablet Commonly known as:  SENOKOT Take 1 tablet (8.6 mg total) by mouth daily as needed for mild constipation.   Vitamin D 2000 units tablet Take 2,000 Units by mouth daily.      [facility] Follow up.   Specialty:  Home Health Services Why:  For Home Health Services Contact information: [address] [place] [address] [phone]          Allergies  Allergen Reactions  . Codeine Nausea And Vomiting  . Novocain [Procaine] Palpitations    On your next visit with your primary care physician please Get Medicines reviewed and adjusted.   Please request your Prim.MD to go over all Hospital Tests and Procedure/Radiological results at the follow up, please get all Hospital records sent to your Prim MD by signing hospital release before you go home.   If you experience worsening of your admission symptoms, develop shortness of breath, life threatening emergency, suicidal or homicidal thoughts you must seek medical attention immediately by calling 911 or calling your MD immediately  if symptoms less severe.  You Must read complete instructions/literature along with all the possible adverse reactions/side effects for all the Medicines you take and that have been prescribed to you. Take any new Medicines after you have completely understood and accpet all the possible adverse reactions/side effects.  Do not drive, operate heavy machinery, perform activities at heights, swimming or participation in water activities or provide baby sitting services if your were admitted for syncope or siezures until you have seen by Primary MD or a Neurologist and advised to do so again.  Do not drive when taking Pain medications.    Do not take more than prescribed Pain, Sleep and Anxiety Medications  Special Instructions: If you have smoked or chewed Tobacco  in the last 2 yrs please stop smoking, stop any regular Alcohol  and or any Recreational drug use.  Wear Seat belts while driving.   Please note  You were cared for by a hospitalist during your hospital stay. If you have any questions about your discharge medications or the care you  received while you were in the hospital after you are discharged, you can call the unit and asked to speak with the hospitalist on call if the hospitalist that took care of you is not available. Once you are discharged, your primary care physician will handle any further medical issues. Please note that NO REFILLS for any discharge medications will be authorized once you are discharged, as it is imperative that you return to your primary care physician (or establish a relationship with a primary care physician if you do not have one) for your aftercare needs so that they can reassess your need for medications and monitor your lab values.   Increase activity slowly        Consultations:  Nephro   Procedures/Studies: Dg Chest 1 View  Result Date: 05/11/2017 CLINICAL DATA:  Status post left-sided thoracentesis EXAM: CHEST 1 VIEW COMPARISON:  Chest x-ray dated May 10, 2017 FINDINGS: The lungs are well-expanded. The volume of fluid on the left has decreased. There is no postprocedure pneumothorax. There is no mediastinal shift. The interstitial markings remain coarse bilaterally. The cardiac silhouette is enlarged. The pulmonary vascularity is not engorged. There is calcification in the wall of the aortic arch. The observed bony thorax exhibits no acute abnormality. IMPRESSION: No postprocedure complication following left-sided thoracentesis. The volume of pleural fluid on the left has decreased. Persistent cardiomegaly with mild pulmonary interstitial prominence. Electronically Signed   By: David  Martinique M.D.   On: 05/11/2017 15:54   Dg Abd 1 View  Result Date: 05/12/2017 CLINICAL DATA:  Nausea after dialysis with vomiting EXAM: ABDOMEN - 1 VIEW COMPARISON:  CT 05/06/2010 FINDINGS: Surgical clips in the right upper quadrant. Mild gaseous dilatation of central and right abdominal small bowel loops up to 2.3 cm but with scattered gas in the colon and rectum. No abnormal calcifications. Arthritis of the  bilateral hips IMPRESSION: Mild increased small and large bowel gas but without evidence for obstruction. Electronically Signed   By: Donavan Foil M.D.   On: 05/12/2017 20:38   Ct Chest Wo Contrast  Result Date: 05/10/2017 CLINICAL DATA:  83 year old female with shortness of breath and bilateral pleural effusions. EXAM: CT CHEST WITHOUT CONTRAST TECHNIQUE: Multidetector CT imaging of the chest was performed following the standard protocol without IV contrast. COMPARISON:  05/10/2017 and prior chest radiographs. 05/01/2009 abdominal CT FINDINGS: Cardiovascular: Cardiomegaly noted. Coronary artery and thoracic aortic atherosclerotic calcifications noted. There is no evidence of thoracic aortic aneurysm. A very small pericardial effusion is noted. Mediastinum/Nodes: No enlarged mediastinal or axillary lymph nodes. Thyroid gland, trachea, and esophagus demonstrate no significant findings. Lungs/Pleura: A moderate to large left pleural effusion and moderate right pleural effusion noted. Bilateral lower lung atelectasis identified, left-greater-than-right. Mild  central ground-glass and interstitial opacities are noted which may represent mild edema. There is no evidence of focal mass or pneumothorax. Upper Abdomen: No acute abnormality. A fatty lesion extending off of the right kidney is stable to minimally increased from 2010. Musculoskeletal: No acute abnormality or suspicious focal lesion. IMPRESSION: Bilateral pleural effusions, moderate to large on the left and moderate on the right with bilateral lower lung atelectasis. Mild central ground-glass opacities which may represent mild interstitial edema. Cardiomegaly and coronary artery disease. Aortic Atherosclerosis (ICD10-I70.0). Electronically Signed   By: Margarette Canada M.D.   On: 05/10/2017 17:30   Dg Chest Port 1 View  Result Date: 05/10/2017 CLINICAL DATA:  New onset of shortness of breath. Patient is on chronic dialysis. History of acute respiratory  failure, hypertension. EXAM: PORTABLE CHEST 1 VIEW COMPARISON:  PA and lateral chest x-ray of November 27, 2015 FINDINGS: The lungs are well-expanded. There is a moderate size left pleural effusion and small right pleural effusion. The cardiac silhouette is enlarged. The pulmonary vascularity is engorged and indistinct. The interstitial markings are mildly increased. There is calcification in the wall of the aortic arch. There is multilevel degenerative disc disease of the thoracic spine. IMPRESSION: CHF with pulmonary interstitial edema and bilateral pleural effusions greatest on the left. Thoracic aortic atherosclerosis. Electronically Signed   By: David  Martinique M.D.   On: 05/10/2017 09:27   Ir Thoracentesis Asp Pleural Space W/img Guide  Result Date: 05/11/2017 INDICATION: Should with a history of end-stage renal disease on hemodialysis with dyspnea. Recent imaging reveals bilateral pleural effusions with the left greater than the right. Request is made for diagnostic and therapeutic thoracentesis. EXAM: ULTRASOUND GUIDED DIAGNOSTIC AND THERAPEUTIC THORACENTESIS MEDICATIONS: 1% lidocaine COMPLICATIONS: None immediate. PROCEDURE: An ultrasound guided thoracentesis was thoroughly discussed with the patient and questions answered. The benefits, risks, alternatives and complications were also discussed. The patient understands and wishes to proceed with the procedure. Written consent was obtained. Ultrasound was performed to localize and mark an adequate pocket of fluid in the left chest as this side had more fluid. The area was then prepped and draped in the normal sterile fashion. 1% Lidocaine was used for local anesthesia. Under ultrasound guidance a Safe-T-Centesis catheter was introduced. Thoracentesis was performed. The catheter was removed and a dressing applied. FINDINGS: A total of approximately 0.6 L of very pale serous fluid was removed. Samples were sent to the laboratory as requested by the clinical  team. IMPRESSION: Successful ultrasound guided left thoracentesis yielding 0.6 L of pleural fluid. Read by: Saverio Danker, PA-C Electronically Signed   By: Sandi Mariscal M.D.   On: 05/11/2017 16:20      Subjective:   Discharge Exam: Vitals:   05/13/17 0448 05/13/17 0839  BP: (!) 118/36 (!) 100/37  Pulse: 66 72  Resp: 18   Temp: 98.3 F (36.8 C)    Vitals:   05/12/17 2125 05/13/17 0448 05/13/17 0839 05/13/17 1015  BP: 139/67 (!) 118/36 (!) 100/37   Pulse: 64 66 72   Resp: 20 18    Temp: 98 F (36.7 C) 98.3 F (36.8 C)    TempSrc: Oral Oral    SpO2: 95% 92%    Weight:    50.1 kg (110 lb 6.4 oz)  Height:        General: Pt is alert, awake, not in acute distress Cardiovascular: RRR, S1/S2 +, no rubs, no gallops Respiratory: CTA bilaterally, no wheezing, no rhonchi Abdominal: Soft, NT, ND, bowel sounds + Extremities: no  edema, no cyanosis    The results of significant diagnostics from this hospitalization (including imaging, microbiology, ancillary and laboratory) are listed below for reference.     Microbiology: Recent Results (from the past 240 hour(s))  MRSA PCR Screening     Status: None   Collection Time: 05/09/17 11:14 PM  Result Value Ref Range Status   MRSA by PCR NEGATIVE NEGATIVE Final    Comment:        The GeneXpert MRSA Assay (FDA approved for NASAL specimens only), is one component of a comprehensive MRSA colonization surveillance program. It is not intended to diagnose MRSA infection nor to guide or monitor treatment for MRSA infections.      Labs: BNP (last 3 results)  Recent Labs  05/09/17 1456  BNP 6,222.9*   Basic Metabolic Panel:  Recent Labs Lab 05/09/17 1456 05/10/17 0632 05/11/17 0428 05/12/17 1348  NA 134* 134* 133* 131*  K 4.2 4.6 4.0 3.6  CL 99* 100* 96* 96*  CO2 $Re'26 24 30 28  'hSu$ GLUCOSE 116* 128* 94 133*  BUN 35* 48* 20 17  CREATININE 5.98* 6.87* 4.84* 5.57*  CALCIUM 8.6* 8.3* 7.8* 8.4*  PHOS  --   --  3.9 3.0    Liver Function Tests:  Recent Labs Lab 05/11/17 0428 05/12/17 1348  ALBUMIN 2.7* 2.9*   No results for input(s): LIPASE, AMYLASE in the last 168 hours. No results for input(s): AMMONIA in the last 168 hours. CBC:  Recent Labs Lab 05/09/17 1456 05/10/17 0632 05/11/17 0428 05/12/17 1348  WBC 4.7 4.4 3.9* 4.2  HGB 9.8* 9.2* 8.8* 9.7*  HCT 31.2* 29.5* 28.3* 31.3*  MCV 83.9 85.0 84.2 85.8  PLT 177 173 164 158   Cardiac Enzymes: No results for input(s): CKTOTAL, CKMB, CKMBINDEX, TROPONINI in the last 168 hours. BNP: Invalid input(s): POCBNP CBG: No results for input(s): GLUCAP in the last 168 hours. D-Dimer No results for input(s): DDIMER in the last 72 hours. Hgb A1c No results for input(s): HGBA1C in the last 72 hours. Lipid Profile No results for input(s): CHOL, HDL, LDLCALC, TRIG, CHOLHDL, LDLDIRECT in the last 72 hours. Thyroid function studies No results for input(s): TSH, T4TOTAL, T3FREE, THYROIDAB in the last 72 hours.  Invalid input(s): FREET3 Anemia work up No results for input(s): VITAMINB12, FOLATE, FERRITIN, TIBC, IRON, RETICCTPCT in the last 72 hours. Urinalysis No results found for: COLORURINE, APPEARANCEUR, [place], [place], [place], [place], [place], [place], PROTEINUR, UROBILINOGEN, NITRITE, LEUKOCYTESUR Sepsis Labs Invalid input(s): PROCALCITONIN,  WBC,  LACTICIDVEN Microbiology Recent Results (from the past 240 hour(s))  MRSA PCR Screening     Status: None   Collection Time: 05/09/17 11:14 PM  Result Value Ref Range Status   MRSA by PCR NEGATIVE NEGATIVE Final    Comment:        The GeneXpert MRSA Assay (FDA approved for NASAL specimens only), is one component of a comprehensive MRSA colonization surveillance program. It is not intended to diagnose MRSA infection nor to guide or monitor treatment for MRSA infections.      Time coordinating discharge: Over 30 minutes  SIGNED:   Damita Lack, MD  [facility] 05/13/2017, 11:20 AM Pager   If 7PM-7AM, please contact night-coverage [email or web address] Password [email or web address]

## 2017-05-12 NOTE — Care Management Note (Addendum)
Case Management Note  Patient Details  Name: Alexis Washington MRN: [id number] Date of Birth: 01-09-1932  Subjective/Objective:                 Spoke with patient and spouse at bedside. Patient states that she has a rollator at home, shower seat, bathroom bedroom and kitchen are on the same floor. Lives at home with spouse. Would like to use [facility] if needed for [facility] PT after DC. Both drive and deny difficulties getting to MD or pharmacy. No difficulties obtaining medications.  PCP: Dr Olivia Canter, [place].    Action/Plan:  CM will continue to follow. [facility] cannot accept, do not service her area. [facility], [facility], and [facility] also deferred. Referral made [facility], [phone] awaiting call back.   Expected Discharge Date:                  Expected Discharge Plan:  [place]  In-House Referral:     Discharge planning Services  CM Consult  Post Acute Care Choice:    Choice offered to:  Patient, Spouse  DME Arranged:    DME Agency:     HH Arranged:    [place] Agency:     Status of Service:  In process, will continue to follow  If discussed at Long Length of Stay Meetings, dates discussed:    Additional Comments:  Carles Collet, RN 05/12/2017, 11:11 AM

## 2017-05-12 NOTE — Procedures (Signed)
  I was present at this dialysis session, have reviewed the session itself and made  appropriate changes Kelly Splinter MD [place] pager [phone]   05/11/2017, 8:27 AM

## 2017-05-12 NOTE — Progress Notes (Signed)
Patient returned from hemodialysis.  Patient vomited 4 times in hemodialysis.  Vomited 2 times after hemodialysis.  MD notified.  Jillyn Ledger, MBA, BSN, RN

## 2017-05-12 NOTE — Evaluation (Signed)
Physical Therapy Evaluation Patient Details Name: Alexis Washington MRN: [id number] DOB: [birth year] Today's Date: 05/12/2017   History of Present Illness  Pt is a 83 yo female admitted through ED on 05/09/17 with 4 day onset of dyspnea, generalized weakness and nausea. Pt had a chest x-ray at PCP which came back with pleural effusion in Left lung. Pt was diagnosed with ARF with hypoxia, pleural effusion and underwent an thoracentesis on 05/11/17. PMH significant for ESRD on HD, diverticulitis, GERD, hypothyroid, anemia.   Clinical Impression  Pt presents with the above diagnosis and below deficits for therapy evaluation. Prior to admission, pt lived with her husband in a single level home with her daughter living across the street. Pt was independent with mobility prior to admission. Pt requires Mod A for all mobility this session due to lethargy suspect due to medication. Pt will benefit from continued acute PT services in order to address the below deficits prior to discharge home.     Follow Up Recommendations Home health PT;Supervision for mobility/OOB    Equipment Recommendations  None recommended by PT    Recommendations for Other Services       Precautions / Restrictions Precautions Precautions: Fall Restrictions Weight Bearing Restrictions: No      Mobility  Bed Mobility Overal bed mobility: Needs Assistance Bed Mobility: Supine to Sit;Sit to Supine     Supine to sit: Min assist Sit to supine: Min assist   General bed mobility comments: Min A to bring LE's EOB  Transfers Overall transfer level: Needs assistance Equipment used: 1 person hand held assist Transfers: Sit to/from Stand Sit to Stand: Mod assist         General transfer comment: Mod A to stand from EOB  Ambulation/Gait Ambulation/Gait assistance: Mod assist Ambulation Distance (Feet): 15 Feet Assistive device: 1 person hand held assist Gait Pattern/deviations: Step-through pattern;Decreased step length  - right;Decreased step length - left Gait velocity: decreased Gait velocity interpretation: Below normal speed for age/gender General Gait Details: Pt requries Mod A for gait around bed. Pt becomes nauseated with gait and does vomit once sitting back down on bed.   Stairs            Wheelchair Mobility    Modified Rankin (Stroke Patients Only)       Balance Overall balance assessment: Needs assistance Sitting-balance support: No upper extremity supported;Feet supported Sitting balance-Leahy Scale: Good     Standing balance support: Single extremity supported Standing balance-Leahy Scale: Fair                               Pertinent Vitals/Pain Pain Assessment: No/denies pain    Home Living Family/patient expects to be discharged to:: Private residence Living Arrangements: Spouse/significant other Available Help at Discharge: Family;Available 24 hours/day Type of Home: House Home Access: Stairs to enter   [employer] of Steps: 2 Home Layout: One level Home Equipment: Walker - 4 wheels;Shower seat;Grab bars - tub/shower      Prior Function Level of Independence: Independent with assistive device(s);Needs assistance   Gait / Transfers Assistance Needed: uses RW when leaving home, no AD outside home  ADL's / Homemaking Assistance Needed: husband assists with bathing, cook together  Comments: able to walk with RW when she goes out, no AD in house.      Hand Dominance   Dominant Hand: Right    Extremity/Trunk Assessment   Upper Extremity Assessment Upper Extremity Assessment: Generalized  weakness    Lower Extremity Assessment Lower Extremity Assessment: Generalized weakness    Cervical / Trunk Assessment Cervical / Trunk Assessment: Kyphotic  Communication   Communication: No difficulties  Cognition Arousal/Alertness: Lethargic;Suspect due to medications Behavior During Therapy: [facility] for tasks assessed/performed Overall  Cognitive Status: Within Functional Limits for tasks assessed                                 General Comments: pt sleepy from taking pain medications.       General Comments      Exercises     Assessment/Plan    PT Assessment Patient needs continued PT services  PT Problem List Decreased strength;Decreased activity tolerance;Decreased balance;Decreased mobility       PT Treatment Interventions DME instruction;Gait training;Functional mobility training;Therapeutic activities;Therapeutic exercise;Balance training    PT Goals (Current goals can be found in the Care Plan section)  Acute Rehab PT Goals Patient Stated Goal: to feel better PT Goal Formulation: With patient/family Time For Goal Achievement: 05/26/17 Potential to Achieve Goals: Good    Frequency Min 3X/week   Barriers to discharge        Co-evaluation               AM-PAC PT "6 Clicks" Daily Activity  Outcome Measure Difficulty turning over in bed (including adjusting bedclothes, sheets and blankets)?: None Difficulty moving from lying on back to sitting on the side of the bed? : Total Difficulty sitting down on and standing up from a chair with arms (e.g., wheelchair, bedside commode, etc,.)?: Total Help needed moving to and from a bed to chair (including a wheelchair)?: A Lot Help needed walking in hospital room?: A Lot Help needed climbing 3-5 steps with a railing? : A Lot 6 Click Score: 12    End of Session Equipment Utilized During Treatment: Gait belt Activity Tolerance: Patient limited by lethargy Patient left: in bed;with call bell/phone within reach;with family/visitor present Nurse Communication: Mobility status PT Visit Diagnosis: Unsteadiness on feet (R26.81);Muscle weakness (generalized) (M62.81);Difficulty in walking, not elsewhere classified (R26.2)    Time: 1031-5945 PT Time Calculation (min) (ACUTE ONLY): 23 min   Charges:   PT Evaluation $PT Eval Moderate  Complexity: 1 Procedure PT Treatments $Therapeutic Activity: 8-22 mins   PT G Codes:        Scheryl Marten PT, DPT  [phone]   Shanon Rosser 05/12/2017, 12:55 PM

## 2017-05-13 ENCOUNTER — Inpatient Hospital Stay (HOSPITAL_COMMUNITY): Payer: PPO

## 2017-05-13 DIAGNOSIS — N186 End stage renal disease: Secondary | ICD-10-CM | POA: Diagnosis not present

## 2017-05-13 DIAGNOSIS — R0600 Dyspnea, unspecified: Secondary | ICD-10-CM

## 2017-05-13 DIAGNOSIS — Z992 Dependence on renal dialysis: Secondary | ICD-10-CM | POA: Diagnosis not present

## 2017-05-13 DIAGNOSIS — N269 Renal sclerosis, unspecified: Secondary | ICD-10-CM | POA: Diagnosis not present

## 2017-05-13 LAB — ECHOCARDIOGRAM COMPLETE
Height: 60 in
Weight: 1873.03 oz

## 2017-05-13 MED ORDER — ONDANSETRON 4 MG PO TBDP: 4.0000 mg | ORAL_TABLET | Freq: Three times a day (TID) | ORAL | 0 refills | Status: DC | PRN

## 2017-05-13 NOTE — Progress Notes (Signed)
  Echocardiogram 2D Echocardiogram has been performed.  Jennette Dubin 05/13/2017, 9:26 AM

## 2017-05-13 NOTE — Progress Notes (Signed)
PROGRESS NOTE    Alexis Washington  DGU:[id number] DOB: 12-02-1931 DOA: 05/09/2017 PCP: Rochel Brome, MD   Brief Narrative:   83 yo female with pmhx of esrd on HD MWF, HTN, Diverticulitis, GERD, Hypothyroidism, anemia comes to the ed with complaints of SOB. She is found to have b/l Pleural effusion L>R. S/p HD  Assessment & Plan:   Principal Problem:   Acute respiratory failure with hypoxia (HCC) Active Problems:   ESRD on dialysis ([place])   Hypertension   GERD (gastroesophageal reflux disease)   Anemia in chronic renal disease   Pleural effusion   Hypothyroidism  Acute respiratory distress with hypoxia, resolved -Has bilateral pleural effusions left greater than right -Status post 2 sessions of hemodialysis in the hospital  -Ultrasound-guided thoracentesis performed on 05/11/2017. 0.6 L of very light serous colored fluid removed.  Pleural effusions -Left greater than right. Status post thoracentesis  ESRD on hemodialysis -Nephrology team following -Received 2 session of hemodialysis inpatient.   Hypertension -Resume  GERD continue PPI  Anemia of chronic diseaselikely secondary to ESRD  Hypothyroidism Continue Synthroid  Abdominal Distention -likely from constipation. Had One BM last night. This morning patient is adamant about going home. Still has little distention. AXR shows mild gaseous distention. I have advised the patient to continue taking Bowel regimen and zofran as needed. Return to the ER or notify PCP if it worsens.  -Ambulate as much as possible. Adv diet as tolerated.   Left sided back pain tender to deep palpation - likely MSK. Asked to take tylenol for pain and use heat pack  DVT prophylaxis: Heparin subcutaneous Code Status: Full Family Communication:  Husband at bedside Disposition Plan: Discharge  Consultants:   Nephro  Procedures:   None  Antimicrobials:   None   Subjective: Patient did not go yesterday after HD as she  started having nausea and several episodes of non bloody vomiting.  This morning she states her abd is still little distended but wants to go home and her husband agrees. She states she will feel more comfortable on her bed at home and be comfortable to walk around more. She states if her abd distention doesn't improve she understand to come back to the ED or notify her PCP.   Objective: Vitals:   05/12/17 2125 05/13/17 0448 05/13/17 0839 05/13/17 1015  BP: 139/67 (!) 118/36 (!) 100/37   Pulse: 64 66 72   Resp: 20 18    Temp: 98 F (36.7 C) 98.3 F (36.8 C)    TempSrc: Oral Oral    SpO2: 95% 92%    Weight:    50.1 kg (110 lb 6.4 oz)  Height:        Intake/Output Summary (Last 24 hours) at 05/13/17 1111 Last data filed at 05/13/17 0958  Gross per 24 hour  Intake              453 ml  Output               56 ml  Net              397 ml   Filed Weights   05/12/17 1320 05/12/17 1734 05/13/17 1015  Weight: 52.1 kg (114 lb 13.8 oz) 53.1 kg (117 lb 1 oz) 50.1 kg (110 lb 6.4 oz)    Examination:  General exam: Appears calm and comfortable  Respiratory system: Diminished BS at the bases L>R Cardiovascular system: S1 & S2 heard, RRR. No JVD, murmurs, rubs, gallops or  clicks. No pedal edema. Gastrointestinal system: Abdomen is mild distention, soft and nontender. No organomegaly or masses felt. Normal bowel sounds heard. Central nervous system: Alert and oriented. No focal neurological deficits. Extremities: Symmetric 5 x 5 power. Skin: No rashes, lesions or ulcers Psychiatry: Judgement and insight appear normal. Mood & affect appropriate.     Data Reviewed:   CBC:  Recent Labs Lab 05/09/17 1456 05/10/17 0632 05/11/17 0428 05/12/17 1348  WBC 4.7 4.4 3.9* 4.2  HGB 9.8* 9.2* 8.8* 9.7*  HCT 31.2* 29.5* 28.3* 31.3*  MCV 83.9 85.0 84.2 85.8  PLT 177 173 164 740   Basic Metabolic Panel:  Recent Labs Lab 05/09/17 1456 05/10/17 0632 05/11/17 0428 05/12/17 1348  NA 134*  134* 133* 131*  K 4.2 4.6 4.0 3.6  CL 99* 100* 96* 96*  CO2 $Re'26 24 30 28  'XcS$ GLUCOSE 116* 128* 94 133*  BUN 35* 48* 20 17  CREATININE 5.98* 6.87* 4.84* 5.57*  CALCIUM 8.6* 8.3* 7.8* 8.4*  PHOS  --   --  3.9 3.0   GFR: Estimated Creatinine Clearance: 5.5 mL/min (A) (by C-G formula based on SCr of 5.57 mg/dL (H)). Liver Function Tests:  Recent Labs Lab 05/11/17 0428 05/12/17 1348  ALBUMIN 2.7* 2.9*   No results for input(s): LIPASE, AMYLASE in the last 168 hours. No results for input(s): AMMONIA in the last 168 hours. Coagulation Profile:  Recent Labs Lab 05/09/17 2200  INR 1.06   Cardiac Enzymes: No results for input(s): CKTOTAL, CKMB, CKMBINDEX, TROPONINI in the last 168 hours. BNP (last 3 results) No results for input(s): PROBNP in the last 8760 hours. HbA1C: No results for input(s): HGBA1C in the last 72 hours. CBG: No results for input(s): GLUCAP in the last 168 hours. Lipid Profile: No results for input(s): CHOL, HDL, LDLCALC, TRIG, CHOLHDL, LDLDIRECT in the last 72 hours. Thyroid Function Tests: No results for input(s): TSH, T4TOTAL, FREET4, T3FREE, THYROIDAB in the last 72 hours. Anemia Panel: No results for input(s): VITAMINB12, FOLATE, FERRITIN, TIBC, IRON, RETICCTPCT in the last 72 hours. Sepsis Labs: No results for input(s): PROCALCITON, LATICACIDVEN in the last 168 hours.  Recent Results (from the past 240 hour(s))  MRSA PCR Screening     Status: None   Collection Time: 05/09/17 11:14 PM  Result Value Ref Range Status   MRSA by PCR NEGATIVE NEGATIVE Final    Comment:        The GeneXpert MRSA Assay (FDA approved for NASAL specimens only), is one component of a comprehensive MRSA colonization surveillance program. It is not intended to diagnose MRSA infection nor to guide or monitor treatment for MRSA infections.          Radiology Studies: Dg Chest 1 View  Result Date: 05/11/2017 CLINICAL DATA:  Status post left-sided thoracentesis EXAM:  CHEST 1 VIEW COMPARISON:  Chest x-ray dated May 10, 2017 FINDINGS: The lungs are well-expanded. The volume of fluid on the left has decreased. There is no postprocedure pneumothorax. There is no mediastinal shift. The interstitial markings remain coarse bilaterally. The cardiac silhouette is enlarged. The pulmonary vascularity is not engorged. There is calcification in the wall of the aortic arch. The observed bony thorax exhibits no acute abnormality. IMPRESSION: No postprocedure complication following left-sided thoracentesis. The volume of pleural fluid on the left has decreased. Persistent cardiomegaly with mild pulmonary interstitial prominence. Electronically Signed   By: David  Martinique M.D.   On: 05/11/2017 15:54   Dg Abd 1 View  Result Date: 05/12/2017 CLINICAL  DATA:  Nausea after dialysis with vomiting EXAM: ABDOMEN - 1 VIEW COMPARISON:  CT 05/06/2010 FINDINGS: Surgical clips in the right upper quadrant. Mild gaseous dilatation of central and right abdominal small bowel loops up to 2.3 cm but with scattered gas in the colon and rectum. No abnormal calcifications. Arthritis of the bilateral hips IMPRESSION: Mild increased small and large bowel gas but without evidence for obstruction. Electronically Signed   By: Donavan Foil M.D.   On: 05/12/2017 20:38   Ir Thoracentesis Asp Pleural Space W/img Guide  Result Date: 05/11/2017 INDICATION: Should with a history of end-stage renal disease on hemodialysis with dyspnea. Recent imaging reveals bilateral pleural effusions with the left greater than the right. Request is made for diagnostic and therapeutic thoracentesis. EXAM: ULTRASOUND GUIDED DIAGNOSTIC AND THERAPEUTIC THORACENTESIS MEDICATIONS: 1% lidocaine COMPLICATIONS: None immediate. PROCEDURE: An ultrasound guided thoracentesis was thoroughly discussed with the patient and questions answered. The benefits, risks, alternatives and complications were also discussed. The patient understands and wishes  to proceed with the procedure. Written consent was obtained. Ultrasound was performed to localize and mark an adequate pocket of fluid in the left chest as this side had more fluid. The area was then prepped and draped in the normal sterile fashion. 1% Lidocaine was used for local anesthesia. Under ultrasound guidance a Safe-T-Centesis catheter was introduced. Thoracentesis was performed. The catheter was removed and a dressing applied. FINDINGS: A total of approximately 0.6 L of very pale serous fluid was removed. Samples were sent to the laboratory as requested by the clinical team. IMPRESSION: Successful ultrasound guided left thoracentesis yielding 0.6 L of pleural fluid. Read by: Saverio Danker, PA-C Electronically Signed   By: Sandi Mariscal M.D.   On: 05/11/2017 16:20        Scheduled Meds: . amLODipine  10 mg Oral QHS  . aspirin EC  81 mg Oral Daily  . calcium acetate  1,334 mg Oral TID WC  . carvedilol  25 mg Oral BID WC  . cholecalciferol  2,000 Units Oral Daily  . docusate sodium  100 mg Oral BID  . heparin  5,000 Units Subcutaneous Q8H  . hydrALAZINE  50 mg Oral Q8H  . levothyroxine  100 mcg Oral QAC breakfast  . pantoprazole  40 mg Oral Daily  . polyethylene glycol  17 g Oral Daily  . raloxifene  60 mg Oral Daily  . senna  1 tablet Oral Daily  . sodium chloride flush  3 mL Intravenous Q12H   Continuous Infusions: . ferric gluconate (FERRLECIT/NULECIT) IV Stopped (05/12/17 1721)     LOS: 3 days    Time spent: 35 mins    Ankit Arsenio Loader, MD [facility] Pager [phone]   If 7PM-7AM, please contact night-coverage [email or web address] Password [email or web address] 05/13/2017, 11:11 AM

## 2017-05-13 NOTE — Progress Notes (Signed)
[facility] Progress Note   Subjective: was nauseous on HD last night, better today.  Back hurts, walking w/ assistance  Objective Vitals:   05/12/17 1734 05/12/17 2125 05/13/17 0448 05/13/17 0839  BP: (!) 132/54 139/67 (!) 118/36 (!) 100/37  Pulse: 73 64 66 72  Resp: (!) $RemoveB'24 20 18   'LByVSlAN$ Temp: 97.6 F (36.4 C) 98 F (36.7 C) 98.3 F (36.8 C)   TempSrc: Oral Oral Oral   SpO2: 95% 95% 92%   Weight: 53.1 kg (117 lb 1 oz)     Height:       Physical Exam General: Heart: I6,E7, 2/6 systolic M. No JVD Lungs: CTAB still decreased in bases Abdomen: Active BS Extremities: No LE edema Dialysis Access: RUA AVF shrill bruit lower portion of AVF   [place] MWF 3 hours 15 minutes 350/800 53 kg 3.0 K/2.25 Ca UF Profile 4 -Heparin: 1000 units IV TIW -Venofer 50 mg IV q weekly (Last dose 05/03/17) -Mircera 50 mcg IV Q 2 weeks (last dose 05/03/17)  Assessment/Plan: 1. Acute resp failure/ pulm edema/ pleural effusions - primarily vol overload; admit wt 57kg, lowest here 51kg.  ECHO just done. Better, on RA, no SOB. Has mult other complaints tho, will d/w primary.  2. ESRD MWF HD. 3. HTN - cont norvasc/ hydral/ coreg 4. Volume - possibly lower dry wt but didn't do well yesterday w/ UF on HD 5. Anemia  - HGB 8.8. Not time for ESA yet. Follow HGB.  Fe 26 Tsat 14%. Ferritin 610 Load with Fe X 5 doses.  6. Metabolic bone disease -  Phos 3.9 Ca 7.8 C Ca 8.84 Continue binders.  7. Nutrition - Albumin 2.7. Renal diet w/fld restrictions, add prostat, renal vits. 7.   Hypothyroidism: per primary  Kelly Splinter MD [facility] pager [phone]   05/13/2017, 9:50 AM  Additional Objective Labs: Basic Metabolic Panel:  Recent Labs Lab 05/10/17 0632 05/11/17 0428 05/12/17 1348  NA 134* 133* 131*  K 4.6 4.0 3.6  CL 100* 96* 96*  CO2 $Re'24 30 28  'KTf$ GLUCOSE 128* 94 133*  BUN 48* 20 17  CREATININE 6.87* 4.84* 5.57*  CALCIUM 8.3* 7.8* 8.4*  PHOS  --  3.9 3.0   Liver  Function Tests:  Recent Labs Lab 05/11/17 0428 05/12/17 1348  ALBUMIN 2.7* 2.9*   No results for input(s): LIPASE, AMYLASE in the last 168 hours. CBC:  Recent Labs Lab 05/09/17 1456 05/10/17 0632 05/11/17 0428 05/12/17 1348  WBC 4.7 4.4 3.9* 4.2  HGB 9.8* 9.2* 8.8* 9.7*  HCT 31.2* 29.5* 28.3* 31.3*  MCV 83.9 85.0 84.2 85.8  PLT 177 173 164 158   Blood Culture No results found for: SDES, SPECREQUEST, CULT, REPTSTATUS  CBG: No results for input(s): GLUCAP in the last 168 hours. Iron Studies: No results for input(s): IRON, TIBC, TRANSFERRIN, FERRITIN in the last 72 hours. $RemoveB'@lablastinr3'nDIfJFjB$ @ Studies/Results: Dg Chest 1 View  Result Date: 05/11/2017 CLINICAL DATA:  Status post left-sided thoracentesis EXAM: CHEST 1 VIEW COMPARISON:  Chest x-ray dated May 10, 2017 FINDINGS: The lungs are well-expanded. The volume of fluid on the left has decreased. There is no postprocedure pneumothorax. There is no mediastinal shift. The interstitial markings remain coarse bilaterally. The cardiac silhouette is enlarged. The pulmonary vascularity is not engorged. There is calcification in the wall of the aortic arch. The observed bony thorax exhibits no acute abnormality. IMPRESSION: No postprocedure complication following left-sided thoracentesis. The volume of pleural fluid on the left has decreased.  Persistent cardiomegaly with mild pulmonary interstitial prominence. Electronically Signed   By: David  Martinique M.D.   On: 05/11/2017 15:54   Dg Abd 1 View  Result Date: 05/12/2017 CLINICAL DATA:  Nausea after dialysis with vomiting EXAM: ABDOMEN - 1 VIEW COMPARISON:  CT 05/06/2010 FINDINGS: Surgical clips in the right upper quadrant. Mild gaseous dilatation of central and right abdominal small bowel loops up to 2.3 cm but with scattered gas in the colon and rectum. No abnormal calcifications. Arthritis of the bilateral hips IMPRESSION: Mild increased small and large bowel gas but without evidence for  obstruction. Electronically Signed   By: Donavan Foil M.D.   On: 05/12/2017 20:38   Ir Thoracentesis Asp Pleural Space W/img Guide  Result Date: 05/11/2017 INDICATION: Should with a history of end-stage renal disease on hemodialysis with dyspnea. Recent imaging reveals bilateral pleural effusions with the left greater than the right. Request is made for diagnostic and therapeutic thoracentesis. EXAM: ULTRASOUND GUIDED DIAGNOSTIC AND THERAPEUTIC THORACENTESIS MEDICATIONS: 1% lidocaine COMPLICATIONS: None immediate. PROCEDURE: An ultrasound guided thoracentesis was thoroughly discussed with the patient and questions answered. The benefits, risks, alternatives and complications were also discussed. The patient understands and wishes to proceed with the procedure. Written consent was obtained. Ultrasound was performed to localize and mark an adequate pocket of fluid in the left chest as this side had more fluid. The area was then prepped and draped in the normal sterile fashion. 1% Lidocaine was used for local anesthesia. Under ultrasound guidance a Safe-T-Centesis catheter was introduced. Thoracentesis was performed. The catheter was removed and a dressing applied. FINDINGS: A total of approximately 0.6 L of very pale serous fluid was removed. Samples were sent to the laboratory as requested by the clinical team. IMPRESSION: Successful ultrasound guided left thoracentesis yielding 0.6 L of pleural fluid. Read by: Saverio Danker, PA-C Electronically Signed   By: Sandi Mariscal M.D.   On: 05/11/2017 16:20   Medications: . ferric gluconate (FERRLECIT/NULECIT) IV Stopped (05/12/17 1721)   . amLODipine  10 mg Oral QHS  . aspirin EC  81 mg Oral Daily  . calcium acetate  1,334 mg Oral TID WC  . carvedilol  25 mg Oral BID WC  . cholecalciferol  2,000 Units Oral Daily  . docusate sodium  100 mg Oral BID  . heparin  5,000 Units Subcutaneous Q8H  . hydrALAZINE  50 mg Oral Q8H  . levothyroxine  100 mcg Oral QAC  breakfast  . pantoprazole  40 mg Oral Daily  . polyethylene glycol  17 g Oral Daily  . raloxifene  60 mg Oral Daily  . senna  1 tablet Oral Daily  . sodium chloride flush  3 mL Intravenous Q12H

## 2017-05-13 NOTE — Progress Notes (Signed)
Pts. B/P is 100/37 and HR is 72. Reesa Chew, MD stated to hold coreg. Orders followed.

## 2017-05-13 NOTE — Progress Notes (Signed)
Patient accepted by [facility], faxed dc note orders and Facesheet to [phone]

## 2017-05-13 NOTE — Discharge Summary (Signed)
Patient did not leave yesterday due to symptoms of nausea and vomiting post HD later on. Seen and examined today - refer to progress note.  Discharge summary updated from yesterday.

## 2017-05-13 NOTE — Progress Notes (Deleted)
Patient was found to be walking out and getting on the elevator. I informed patient that the MD would have to place D/C orders prior to her leaving. Patient stated, "I'm not a hostage, I can leave whenever I want to and I'm leaving now." I then asked if she would sign AMA form and she stated that she would not. I then stated that I would have to remove  IV to prevent infection. Iv removed. Patient agreed to stay until I retrieved her medications from pharmacy. Medications returned to patient along with copy of medication list. Alexis Washington Nurse called. Alexis Sanger, RN agreed to contact MD. MD on unit and stated that if she waited on her D/C paperwork, that he would complete it promptly. Patient agreed to stay. Once D/C paperwork is completed, AVS will be given to patient and reviewed. Will continue to monitor.

## 2017-05-13 NOTE — Progress Notes (Signed)
[name] to be D/C'd Home per MD order.  Discussed prescriptions and follow up appointments with the patient. Prescriptions given to patient, medication list explained in detail. Pt verbalized understanding.  Allergies as of 05/13/2017      Reactions   Codeine Nausea And Vomiting   Novocain [procaine] Palpitations      Medication List    TAKE these medications   acetaminophen 500 MG tablet Commonly known as:  TYLENOL Take 500 mg by mouth every 6 (six) hours as needed (pain).   amLODipine 5 MG tablet Commonly known as:  NORVASC Take 5 mg by mouth daily. What changed:  Another medication with the same name was removed. Continue taking this medication, and follow the directions you see here.   aspirin EC 81 MG tablet Take 81 mg by mouth daily.   calcium acetate 667 MG capsule Commonly known as:  PHOSLO Take 1,334 mg by mouth 3 (three) times daily.   carvedilol 25 MG tablet Commonly known as:  COREG Take 25 mg by mouth 2 (two) times daily with a meal.   CORICIDIN HBP COLD/FLU PO Take 1 tablet by mouth 2 (two) times daily as needed (allergies).   docusate sodium 100 MG capsule Commonly known as:  COLACE Take 1 capsule (100 mg total) by mouth 2 (two) times daily as needed for mild constipation.   furosemide 20 MG tablet Commonly known as:  LASIX Take 40 mg by mouth 2 (two) times daily.   hydrALAZINE 50 MG tablet Commonly known as:  APRESOLINE Take 50 mg by mouth 2 (two) times daily.   hydrochlorothiazide 25 MG tablet Commonly known as:  HYDRODIURIL Take 25 mg by mouth every morning.   levothyroxine 100 MCG tablet Commonly known as:  SYNTHROID, LEVOTHROID Take 100 mcg by mouth daily before breakfast.   NITROSTAT 0.4 MG SL tablet Generic drug:  nitroGLYCERIN Place 0.4 mg under the tongue every 5 (five) minutes as needed for chest pain.   omeprazole 40 MG capsule Commonly known as:  PRILOSEC Take 40 mg by mouth daily.   ondansetron 4 MG disintegrating  tablet Commonly known as:  ZOFRAN ODT Take 1 tablet (4 mg total) by mouth every 8 (eight) hours as needed for nausea or vomiting.   ondansetron 4 MG tablet Commonly known as:  ZOFRAN Take 4 mg by mouth every 8 (eight) hours as needed for nausea or vomiting.   OVER THE COUNTER MEDICATION Place 1 drop into both eyes daily as needed (allergy symptoms). Allergy Relief eye drops   oxyCODONE-acetaminophen 5-325 MG tablet Commonly known as:  PERCOCET/ROXICET Take 1 tablet by mouth every 4 (four) hours as needed.   polyethylene glycol packet Commonly known as:  MIRALAX / GLYCOLAX Take 17 g by mouth daily as needed.   PROCRIT IJ Inject as directed once a week. Reported on 03/09/2016   raloxifene 60 MG tablet Commonly known as:  EVISTA Take 60 mg by mouth daily.   senna 8.6 MG Tabs tablet Commonly known as:  SENOKOT Take 1 tablet (8.6 mg total) by mouth daily as needed for mild constipation.   Vitamin D 2000 units tablet Take 2,000 Units by mouth daily.       Vitals:   05/13/17 0448 05/13/17 0839  BP: (!) 118/36 (!) 100/37  Pulse: 66 72  Resp: 18   Temp: 98.3 F (36.8 C)     Skin clean, dry and intact without evidence of skin break down, no evidence of skin tears noted. IV catheter  discontinued intact. Site without signs and symptoms of complications. Dressing and pressure applied. Pt denies pain at this time. No complaints noted.  An After Visit Summary was printed and given to the patient. Patient escorted via [place], and D/C home via private auto.  Dixie Dials RN, BSN

## 2017-05-15 DIAGNOSIS — N186 End stage renal disease: Secondary | ICD-10-CM | POA: Diagnosis not present

## 2017-05-15 DIAGNOSIS — D689 Coagulation defect, unspecified: Secondary | ICD-10-CM | POA: Diagnosis not present

## 2017-05-15 DIAGNOSIS — I1311 Hypertensive heart and chronic kidney disease without heart failure, with stage 5 chronic kidney disease, or end stage renal disease: Secondary | ICD-10-CM | POA: Diagnosis not present

## 2017-05-15 DIAGNOSIS — D631 Anemia in chronic kidney disease: Secondary | ICD-10-CM | POA: Diagnosis not present

## 2017-05-15 DIAGNOSIS — D509 Iron deficiency anemia, unspecified: Secondary | ICD-10-CM | POA: Diagnosis not present

## 2017-05-15 DIAGNOSIS — M6281 Muscle weakness (generalized): Secondary | ICD-10-CM | POA: Diagnosis not present

## 2017-05-15 DIAGNOSIS — E876 Hypokalemia: Secondary | ICD-10-CM | POA: Diagnosis not present

## 2017-05-15 DIAGNOSIS — J90 Pleural effusion, not elsewhere classified: Secondary | ICD-10-CM | POA: Diagnosis not present

## 2017-05-15 DIAGNOSIS — R0600 Dyspnea, unspecified: Secondary | ICD-10-CM | POA: Diagnosis not present

## 2017-06-13 DIAGNOSIS — H43813 Vitreous degeneration, bilateral: Secondary | ICD-10-CM | POA: Diagnosis not present

## 2017-06-13 DIAGNOSIS — Z992 Dependence on renal dialysis: Secondary | ICD-10-CM | POA: Diagnosis not present

## 2017-06-13 DIAGNOSIS — Z961 Presence of intraocular lens: Secondary | ICD-10-CM | POA: Diagnosis not present

## 2017-06-13 DIAGNOSIS — N186 End stage renal disease: Secondary | ICD-10-CM | POA: Diagnosis not present

## 2017-06-13 DIAGNOSIS — H5203 Hypermetropia, bilateral: Secondary | ICD-10-CM | POA: Diagnosis not present

## 2017-06-13 DIAGNOSIS — H43393 Other vitreous opacities, bilateral: Secondary | ICD-10-CM | POA: Diagnosis not present

## 2017-06-13 DIAGNOSIS — N269 Renal sclerosis, unspecified: Secondary | ICD-10-CM | POA: Diagnosis not present

## 2017-06-13 DIAGNOSIS — I10 Essential (primary) hypertension: Secondary | ICD-10-CM | POA: Diagnosis not present

## 2017-06-13 DIAGNOSIS — H524 Presbyopia: Secondary | ICD-10-CM | POA: Diagnosis not present

## 2017-06-13 DIAGNOSIS — Z9841 Cataract extraction status, right eye: Secondary | ICD-10-CM | POA: Diagnosis not present

## 2017-06-13 DIAGNOSIS — H52223 Regular astigmatism, bilateral: Secondary | ICD-10-CM | POA: Diagnosis not present

## 2017-06-13 DIAGNOSIS — Z9842 Cataract extraction status, left eye: Secondary | ICD-10-CM | POA: Diagnosis not present

## 2017-06-14 DIAGNOSIS — D689 Coagulation defect, unspecified: Secondary | ICD-10-CM | POA: Diagnosis not present

## 2017-06-14 DIAGNOSIS — E876 Hypokalemia: Secondary | ICD-10-CM | POA: Diagnosis not present

## 2017-06-14 DIAGNOSIS — D631 Anemia in chronic kidney disease: Secondary | ICD-10-CM | POA: Diagnosis not present

## 2017-06-14 DIAGNOSIS — N186 End stage renal disease: Secondary | ICD-10-CM | POA: Diagnosis not present

## 2017-07-11 DIAGNOSIS — E8770 Fluid overload, unspecified: Secondary | ICD-10-CM | POA: Diagnosis not present

## 2017-07-11 DIAGNOSIS — N186 End stage renal disease: Secondary | ICD-10-CM | POA: Diagnosis not present

## 2017-07-14 DIAGNOSIS — Z992 Dependence on renal dialysis: Secondary | ICD-10-CM | POA: Diagnosis not present

## 2017-07-14 DIAGNOSIS — N186 End stage renal disease: Secondary | ICD-10-CM | POA: Diagnosis not present

## 2017-07-14 DIAGNOSIS — N269 Renal sclerosis, unspecified: Secondary | ICD-10-CM | POA: Diagnosis not present

## 2017-07-17 DIAGNOSIS — D689 Coagulation defect, unspecified: Secondary | ICD-10-CM | POA: Diagnosis not present

## 2017-07-17 DIAGNOSIS — N186 End stage renal disease: Secondary | ICD-10-CM | POA: Diagnosis not present

## 2017-07-17 DIAGNOSIS — E876 Hypokalemia: Secondary | ICD-10-CM | POA: Diagnosis not present

## 2017-07-17 DIAGNOSIS — D631 Anemia in chronic kidney disease: Secondary | ICD-10-CM | POA: Diagnosis not present

## 2017-07-18 DIAGNOSIS — E782 Mixed hyperlipidemia: Secondary | ICD-10-CM | POA: Diagnosis not present

## 2017-07-18 DIAGNOSIS — R600 Localized edema: Secondary | ICD-10-CM | POA: Diagnosis not present

## 2017-07-18 DIAGNOSIS — E038 Other specified hypothyroidism: Secondary | ICD-10-CM | POA: Diagnosis not present

## 2017-07-18 DIAGNOSIS — N186 End stage renal disease: Secondary | ICD-10-CM | POA: Diagnosis not present

## 2017-07-18 DIAGNOSIS — Z992 Dependence on renal dialysis: Secondary | ICD-10-CM | POA: Diagnosis not present

## 2017-07-18 DIAGNOSIS — I10 Essential (primary) hypertension: Secondary | ICD-10-CM | POA: Diagnosis not present

## 2017-07-18 DIAGNOSIS — Z111 Encounter for screening for respiratory tuberculosis: Secondary | ICD-10-CM | POA: Diagnosis not present

## 2017-07-19 DIAGNOSIS — E038 Other specified hypothyroidism: Secondary | ICD-10-CM | POA: Diagnosis not present

## 2017-08-13 DIAGNOSIS — N269 Renal sclerosis, unspecified: Secondary | ICD-10-CM | POA: Diagnosis not present

## 2017-08-13 DIAGNOSIS — Z992 Dependence on renal dialysis: Secondary | ICD-10-CM | POA: Diagnosis not present

## 2017-08-13 DIAGNOSIS — N186 End stage renal disease: Secondary | ICD-10-CM | POA: Diagnosis not present

## 2017-08-14 DIAGNOSIS — D689 Coagulation defect, unspecified: Secondary | ICD-10-CM | POA: Diagnosis not present

## 2017-08-14 DIAGNOSIS — E876 Hypokalemia: Secondary | ICD-10-CM | POA: Diagnosis not present

## 2017-08-14 DIAGNOSIS — D631 Anemia in chronic kidney disease: Secondary | ICD-10-CM | POA: Diagnosis not present

## 2017-08-14 DIAGNOSIS — N186 End stage renal disease: Secondary | ICD-10-CM | POA: Diagnosis not present

## 2017-08-14 DIAGNOSIS — D509 Iron deficiency anemia, unspecified: Secondary | ICD-10-CM | POA: Diagnosis not present

## 2017-08-30 DIAGNOSIS — E038 Other specified hypothyroidism: Secondary | ICD-10-CM | POA: Diagnosis not present

## 2017-08-31 ENCOUNTER — Ambulatory Visit (INDEPENDENT_AMBULATORY_CARE_PROVIDER_SITE_OTHER): Payer: PPO | Admitting: Cardiology

## 2017-08-31 ENCOUNTER — Encounter: Payer: Self-pay | Admitting: Cardiology

## 2017-08-31 VITALS — BP 144/66 | HR 73 | Ht 60.0 in | Wt 115.0 lb

## 2017-08-31 DIAGNOSIS — N186 End stage renal disease: Secondary | ICD-10-CM | POA: Diagnosis not present

## 2017-08-31 DIAGNOSIS — I10 Essential (primary) hypertension: Secondary | ICD-10-CM

## 2017-08-31 DIAGNOSIS — Z992 Dependence on renal dialysis: Secondary | ICD-10-CM | POA: Diagnosis not present

## 2017-08-31 NOTE — Progress Notes (Signed)
Cardiology Office Note:    Date:  08/31/2017   ID:  Alexis Washington, DOB 1932/07/18, MRN [id number]  PCP:  Rochel Brome, MD  Cardiologist:  Jenean Lindau, MD   Referring MD: Rochel Brome, MD    ASSESSMENT:    1. Essential hypertension   2. ESRD on dialysis [facility])    PLAN:    In order of problems listed above:  Patient's blood pressure is stable. She is ambulating the best of her ability. Her blood work is followed by her nephrologist. She will be seen in follow-up appointment in 6 months or earlier if she has any concerns. I reviewed hospital records.    Medication Adjustments/Labs and Tests Ordered: Current medicines are reviewed at length with the patient today.  Concerns regarding medicines are outlined above.  No orders of the defined types were placed in this encounter.  No orders of the defined types were placed in this encounter.    History of Present Illness:    Alexis Washington is a 83 y.o. female who is being seen today for the evaluation of essential hypertension ) ESRD at the request of [name], Kirsten, MD. Patient is here for follow-up. She denies any problems at this time. He'll get [place] [facility] with bilateral pleural effusions and she was treated for this with pleural tap. Subsequently she's felt well. She denies any problems she ambulates well and she is age appropriately. She is on dialysis therapy on a regular basis and she tells me that it helps her significantly.  Past Medical History:  Diagnosis Date  . Anemia in chronic renal disease    ESRD- Sees Dr Justin Mend  . Arthritis   . Benign lipomatous neoplasm of kidney   . Chronic kidney disease, stage V ([place])   . Complication of anesthesia 1998   "blood pressure dropped low during surgery and after surgery it went up really high."   . Diverticulitis   . GERD (gastroesophageal reflux disease)   . Heart murmur    PCP- said it is nothing to be concerned  . HOH (hard of hearing)   . Hyperkalemia   .  Hyperphosphatemia   . Hypertension   . Hypertensive kidney disease   . Hypothyroidism   . Nail-patella syndrome   . PONV (postoperative nausea and vomiting)   . Proteinuria   . Shortness of breath dyspnea     Past Surgical History:  Procedure Laterality Date  . ABDOMINAL HYSTERECTOMY    . APPENDECTOMY    . AV FISTULA PLACEMENT Right 08/10/2015   Procedure: Right Arm Arteriovenous Brachio-cephalic Fistula ;  Surgeon: Conrad Bloomfield, MD;  Location: [place];  Service: Vascular;  Laterality: Right;  . BASCILIC VEIN TRANSPOSITION Right 12/15/2015   Procedure: SECOND STAGE Right Arm BRACHIAL VEIN TRANSPOSITION;  Surgeon: Conrad Los Nopalitos, MD;  Location: [place];  Service: Vascular;  Laterality: Right;  . CATARACT EXTRACTION W/ INTRAOCULAR LENS  IMPLANT, BILATERAL    . CHOLECYSTECTOMY  1998  . COLONOSCOPY    . FISTULA SUPERFICIALIZATION Right 08/10/2015   Procedure: Right First Stage Brachial Transposition;  Surgeon: Conrad Fulda, MD;  Location: [place];  Service: Vascular;  Laterality: Right;  . INSERTION OF DIALYSIS CATHETER N/A 08/10/2015   Procedure: INSERTION OF Right Internal Jugular DIALYSIS CATHETER;  Surgeon: Conrad Camas, MD;  Location: [place];  Service: Vascular;  Laterality: N/A;  . IR THORACENTESIS ASP PLEURAL SPACE W/IMG GUIDE  05/11/2017  . TONSILLECTOMY      Current  Medications: Current Meds  Medication Sig  . acetaminophen (TYLENOL) 500 MG tablet Take 500 mg by mouth every 6 (six) hours as needed (pain).  Marland Kitchen amLODipine (NORVASC) 5 MG tablet Take 5 mg by mouth daily.  Marland Kitchen aspirin EC 81 MG tablet Take 81 mg by mouth daily.  . calcium acetate (PHOSLO) 667 MG capsule Take 1,334 mg by mouth 3 (three) times daily.   . carvedilol (COREG) 25 MG tablet Take 25 mg by mouth 2 (two) times daily with a meal.  . Chlorpheniramine-Acetaminophen (CORICIDIN HBP COLD/FLU PO) Take 1 tablet by mouth daily.   . Cholecalciferol (VITAMIN D) 2000 UNITS tablet Take 2,000 Units by mouth daily.  Marland Kitchen docusate sodium  (COLACE) 100 MG capsule Take 1 capsule (100 mg total) by mouth 2 (two) times daily as needed for mild constipation.  Marland Kitchen Epoetin Alfa (PROCRIT IJ) Inject as directed once a week. Reported on 03/09/2016  . levothyroxine (SYNTHROID, LEVOTHROID) 137 MCG tablet Take 137 mcg by mouth daily.  Marland Kitchen NITROSTAT 0.4 MG SL tablet Place 0.4 mg under the tongue every 5 (five) minutes as needed for chest pain.   Marland Kitchen omeprazole (PRILOSEC) 40 MG capsule Take 40 mg by mouth daily.  . ondansetron (ZOFRAN ODT) 4 MG disintegrating tablet Take 1 tablet (4 mg total) by mouth every 8 (eight) hours as needed for nausea or vomiting.  Marland Kitchen OVER THE COUNTER MEDICATION Place 1 drop into both eyes daily as needed (allergy symptoms). Allergy Relief eye drops  . polyethylene glycol (MIRALAX / GLYCOLAX) packet Take 17 g by mouth daily as needed.  . raloxifene (EVISTA) 60 MG tablet Take 60 mg by mouth daily.  Marland Kitchen senna (SENOKOT) 8.6 MG TABS tablet Take 1 tablet (8.6 mg total) by mouth daily as needed for mild constipation.  . [DISCONTINUED] levothyroxine (SYNTHROID, LEVOTHROID) 100 MCG tablet Take 100 mcg by mouth daily before breakfast.      Allergies:   Codeine and Novocain [procaine]   Social History   Social History  . Marital status: Married    Spouse name: N/A  . Number of children: N/A  . Years of education: N/A   Social History Main Topics  . Smoking status: Never Smoker  . Smokeless tobacco: Never Used  . Alcohol use No  . Drug use: No  . Sexual activity: Not Asked   Other Topics Concern  . None   Social History Narrative  . None     Family History: The patient's family history includes Heart attack in her father; Heart disease in her father; Hyperlipidemia in her father; Hypertension in her father and mother.  ROS:   Please see the history of present illness.    All other systems reviewed and are negative.  EKGs/Labs/Other Studies Reviewed:    The following studies were reviewed today: I reviewed her  records and discussed my findings with the patient at extensive length.   Recent Labs: 05/09/2017: B Natriuretic Peptide 2,016.0 05/12/2017: BUN 17; Creatinine, Ser 5.57; Hemoglobin 9.7; Platelets 158; Potassium 3.6; Sodium 131  Recent Lipid Panel No results found for: CHOL, TRIG, HDL, CHOLHDL, VLDL, LDLCALC, LDLDIRECT  Physical Exam:    VS:  BP (!) 144/66   Pulse 73   Ht 5' (1.524 m)   Wt 115 lb (52.2 kg)   SpO2 93%   BMI 22.46 kg/m     Wt Readings from Last 3 Encounters:  08/31/17 115 lb (52.2 kg)  05/13/17 110 lb 6.4 oz (50.1 kg)  07/19/16 128 lb 8  oz (58.3 kg)     GEN: Patient is in no acute distress HEENT: Normal NECK: No JVD; No carotid bruits LYMPHATICS: No lymphadenopathy CARDIAC: S1 S2 regular, 2/6 systolic murmur at the apex. RESPIRATORY:  Clear to auscultation without rales, wheezing or rhonchi  ABDOMEN: Soft, non-tender, non-distended MUSCULOSKELETAL:  No edema; No deformity  SKIN: Warm and dry NEUROLOGIC:  Alert and oriented x 3 PSYCHIATRIC:  Normal affect    Signed, Jenean Lindau, MD  08/31/2017 3:06 PM    [facility]

## 2017-08-31 NOTE — Patient Instructions (Signed)
Medication Instructions:  Your physician recommends that you continue on your current medications as directed. Please refer to the Current Medication list given to you today.   Labwork: None  Testing/Procedures: None  Follow-Up: 6 months  Any Other Special Instructions Will Be Listed Below (If Applicable).     If you need a refill on your cardiac medications before your next appointment, please call your pharmacy.

## 2017-09-13 DIAGNOSIS — Z992 Dependence on renal dialysis: Secondary | ICD-10-CM | POA: Diagnosis not present

## 2017-09-13 DIAGNOSIS — N186 End stage renal disease: Secondary | ICD-10-CM | POA: Diagnosis not present

## 2017-09-13 DIAGNOSIS — N269 Renal sclerosis, unspecified: Secondary | ICD-10-CM | POA: Diagnosis not present

## 2017-09-15 DIAGNOSIS — E8770 Fluid overload, unspecified: Secondary | ICD-10-CM | POA: Diagnosis not present

## 2017-09-15 DIAGNOSIS — E876 Hypokalemia: Secondary | ICD-10-CM | POA: Diagnosis not present

## 2017-09-15 DIAGNOSIS — D631 Anemia in chronic kidney disease: Secondary | ICD-10-CM | POA: Diagnosis not present

## 2017-09-15 DIAGNOSIS — N2581 Secondary hyperparathyroidism of renal origin: Secondary | ICD-10-CM | POA: Diagnosis not present

## 2017-09-15 DIAGNOSIS — D689 Coagulation defect, unspecified: Secondary | ICD-10-CM | POA: Diagnosis not present

## 2017-09-15 DIAGNOSIS — D509 Iron deficiency anemia, unspecified: Secondary | ICD-10-CM | POA: Diagnosis not present

## 2017-09-15 DIAGNOSIS — N186 End stage renal disease: Secondary | ICD-10-CM | POA: Diagnosis not present

## 2017-10-13 DIAGNOSIS — Z992 Dependence on renal dialysis: Secondary | ICD-10-CM | POA: Diagnosis not present

## 2017-10-13 DIAGNOSIS — N186 End stage renal disease: Secondary | ICD-10-CM | POA: Diagnosis not present

## 2017-10-13 DIAGNOSIS — N269 Renal sclerosis, unspecified: Secondary | ICD-10-CM | POA: Diagnosis not present

## 2017-10-16 DIAGNOSIS — D689 Coagulation defect, unspecified: Secondary | ICD-10-CM | POA: Diagnosis not present

## 2017-10-16 DIAGNOSIS — E876 Hypokalemia: Secondary | ICD-10-CM | POA: Diagnosis not present

## 2017-10-16 DIAGNOSIS — N2581 Secondary hyperparathyroidism of renal origin: Secondary | ICD-10-CM | POA: Diagnosis not present

## 2017-10-16 DIAGNOSIS — R52 Pain, unspecified: Secondary | ICD-10-CM | POA: Diagnosis not present

## 2017-10-16 DIAGNOSIS — N186 End stage renal disease: Secondary | ICD-10-CM | POA: Diagnosis not present

## 2017-10-16 DIAGNOSIS — E875 Hyperkalemia: Secondary | ICD-10-CM | POA: Diagnosis not present

## 2017-10-16 DIAGNOSIS — D509 Iron deficiency anemia, unspecified: Secondary | ICD-10-CM | POA: Diagnosis not present

## 2017-10-19 DIAGNOSIS — R600 Localized edema: Secondary | ICD-10-CM | POA: Diagnosis not present

## 2017-10-19 DIAGNOSIS — I10 Essential (primary) hypertension: Secondary | ICD-10-CM | POA: Diagnosis not present

## 2017-10-19 DIAGNOSIS — E038 Other specified hypothyroidism: Secondary | ICD-10-CM | POA: Diagnosis not present

## 2017-10-19 DIAGNOSIS — Z6822 Body mass index (BMI) 22.0-22.9, adult: Secondary | ICD-10-CM | POA: Diagnosis not present

## 2017-10-19 DIAGNOSIS — N186 End stage renal disease: Secondary | ICD-10-CM | POA: Diagnosis not present

## 2017-11-02 DIAGNOSIS — J018 Other acute sinusitis: Secondary | ICD-10-CM | POA: Diagnosis not present

## 2017-11-08 ENCOUNTER — Inpatient Hospital Stay (HOSPITAL_COMMUNITY)
Admission: EM | Admit: 2017-11-08 | Discharge: 2017-11-11 | DRG: 640 | Disposition: A | Discharge status: Home / Self Care | Payer: PPO | Attending: Oncology | Admitting: Oncology

## 2017-11-08 ENCOUNTER — Other Ambulatory Visit: Payer: Self-pay

## 2017-11-08 ENCOUNTER — Emergency Department (HOSPITAL_COMMUNITY): Payer: PPO

## 2017-11-08 ENCOUNTER — Encounter (HOSPITAL_COMMUNITY): Payer: Self-pay | Admitting: Emergency Medicine

## 2017-11-08 DIAGNOSIS — Z79899 Other long term (current) drug therapy: Secondary | ICD-10-CM | POA: Diagnosis not present

## 2017-11-08 DIAGNOSIS — I5032 Chronic diastolic (congestive) heart failure: Secondary | ICD-10-CM | POA: Diagnosis not present

## 2017-11-08 DIAGNOSIS — N186 End stage renal disease: Secondary | ICD-10-CM | POA: Diagnosis not present

## 2017-11-08 DIAGNOSIS — Z885 Allergy status to narcotic agent status: Secondary | ICD-10-CM | POA: Diagnosis not present

## 2017-11-08 DIAGNOSIS — B9789 Other viral agents as the cause of diseases classified elsewhere: Secondary | ICD-10-CM | POA: Diagnosis not present

## 2017-11-08 DIAGNOSIS — R069 Unspecified abnormalities of breathing: Secondary | ICD-10-CM | POA: Diagnosis not present

## 2017-11-08 DIAGNOSIS — R0602 Shortness of breath: Secondary | ICD-10-CM

## 2017-11-08 DIAGNOSIS — J9601 Acute respiratory failure with hypoxia: Secondary | ICD-10-CM | POA: Diagnosis not present

## 2017-11-08 DIAGNOSIS — E8779 Other fluid overload: Principal | ICD-10-CM | POA: Diagnosis present

## 2017-11-08 DIAGNOSIS — I120 Hypertensive chronic kidney disease with stage 5 chronic kidney disease or end stage renal disease: Secondary | ICD-10-CM | POA: Diagnosis not present

## 2017-11-08 DIAGNOSIS — K219 Gastro-esophageal reflux disease without esophagitis: Secondary | ICD-10-CM | POA: Diagnosis not present

## 2017-11-08 DIAGNOSIS — I132 Hypertensive heart and chronic kidney disease with heart failure and with stage 5 chronic kidney disease, or end stage renal disease: Secondary | ICD-10-CM | POA: Diagnosis present

## 2017-11-08 DIAGNOSIS — Z992 Dependence on renal dialysis: Secondary | ICD-10-CM

## 2017-11-08 DIAGNOSIS — D631 Anemia in chronic kidney disease: Secondary | ICD-10-CM | POA: Diagnosis present

## 2017-11-08 DIAGNOSIS — E8889 Other specified metabolic disorders: Secondary | ICD-10-CM | POA: Diagnosis not present

## 2017-11-08 DIAGNOSIS — J90 Pleural effusion, not elsewhere classified: Secondary | ICD-10-CM | POA: Diagnosis not present

## 2017-11-08 DIAGNOSIS — Z884 Allergy status to anesthetic agent status: Secondary | ICD-10-CM | POA: Diagnosis not present

## 2017-11-08 DIAGNOSIS — Z7982 Long term (current) use of aspirin: Secondary | ICD-10-CM

## 2017-11-08 DIAGNOSIS — J069 Acute upper respiratory infection, unspecified: Secondary | ICD-10-CM | POA: Diagnosis not present

## 2017-11-08 DIAGNOSIS — Z79810 Long term (current) use of selective estrogen receptor modulators (SERMs): Secondary | ICD-10-CM

## 2017-11-08 DIAGNOSIS — M810 Age-related osteoporosis without current pathological fracture: Secondary | ICD-10-CM | POA: Diagnosis present

## 2017-11-08 DIAGNOSIS — Z8249 Family history of ischemic heart disease and other diseases of the circulatory system: Secondary | ICD-10-CM | POA: Diagnosis not present

## 2017-11-08 DIAGNOSIS — N189 Chronic kidney disease, unspecified: Secondary | ICD-10-CM

## 2017-11-08 DIAGNOSIS — H9190 Unspecified hearing loss, unspecified ear: Secondary | ICD-10-CM | POA: Diagnosis present

## 2017-11-08 DIAGNOSIS — Z79890 Hormone replacement therapy (postmenopausal): Secondary | ICD-10-CM

## 2017-11-08 DIAGNOSIS — N2581 Secondary hyperparathyroidism of renal origin: Secondary | ICD-10-CM | POA: Diagnosis present

## 2017-11-08 DIAGNOSIS — E039 Hypothyroidism, unspecified: Secondary | ICD-10-CM | POA: Diagnosis not present

## 2017-11-08 DIAGNOSIS — E8770 Fluid overload, unspecified: Secondary | ICD-10-CM | POA: Diagnosis present

## 2017-11-08 HISTORY — DX: Dependence on renal dialysis: Z99.2

## 2017-11-08 HISTORY — DX: End stage renal disease: N18.6

## 2017-11-08 LAB — CBC WITH DIFFERENTIAL/PLATELET
BASOS PCT: 0 %
Basophils Absolute: 0 10*3/uL (ref 0.0–0.1)
EOS ABS: 0.2 10*3/uL (ref 0.0–0.7)
Eosinophils Relative: 3 %
HEMATOCRIT: 35.4 % — AB (ref 36.0–46.0)
HEMOGLOBIN: 11.2 g/dL — AB (ref 12.0–15.0)
LYMPHS ABS: 0.8 10*3/uL (ref 0.7–4.0)
Lymphocytes Relative: 11 %
MCH: 27.2 pg (ref 26.0–34.0)
MCHC: 31.6 g/dL (ref 30.0–36.0)
MCV: 85.9 fL (ref 78.0–100.0)
MONO ABS: 0.5 10*3/uL (ref 0.1–1.0)
MONOS PCT: 7 %
Neutro Abs: 5.8 10*3/uL (ref 1.7–7.7)
Neutrophils Relative %: 79 %
Platelets: 198 10*3/uL (ref 150–400)
RBC: 4.12 MIL/uL (ref 3.87–5.11)
RDW: 15 % (ref 11.5–15.5)
WBC: 7.3 10*3/uL (ref 4.0–10.5)

## 2017-11-08 LAB — INFLUENZA PANEL BY PCR (TYPE A & B)
Influenza A By PCR: NEGATIVE
Influenza B By PCR: NEGATIVE

## 2017-11-08 LAB — RESPIRATORY PANEL BY PCR
ADENOVIRUS-RVPPCR: NOT DETECTED
Bordetella pertussis: NOT DETECTED
CORONAVIRUS NL63-RVPPCR: NOT DETECTED
CORONAVIRUS OC43-RVPPCR: NOT DETECTED
Chlamydophila pneumoniae: NOT DETECTED
Coronavirus 229E: NOT DETECTED
Coronavirus HKU1: NOT DETECTED
INFLUENZA A-RVPPCR: NOT DETECTED
Influenza B: NOT DETECTED
METAPNEUMOVIRUS-RVPPCR: NOT DETECTED
Mycoplasma pneumoniae: NOT DETECTED
PARAINFLUENZA VIRUS 1-RVPPCR: NOT DETECTED
PARAINFLUENZA VIRUS 3-RVPPCR: NOT DETECTED
PARAINFLUENZA VIRUS 4-RVPPCR: NOT DETECTED
Parainfluenza Virus 2: NOT DETECTED
RESPIRATORY SYNCYTIAL VIRUS-RVPPCR: NOT DETECTED
RHINOVIRUS / ENTEROVIRUS - RVPPCR: DETECTED — AB

## 2017-11-08 LAB — COMPREHENSIVE METABOLIC PANEL
ALBUMIN: 3.6 g/dL (ref 3.5–5.0)
ALK PHOS: 119 U/L (ref 38–126)
ALT: 31 U/L (ref 14–54)
AST: 27 U/L (ref 15–41)
Anion gap: 14 (ref 5–15)
BUN: 61 mg/dL — AB (ref 6–20)
CALCIUM: 8.6 mg/dL — AB (ref 8.9–10.3)
CHLORIDE: 94 mmol/L — AB (ref 101–111)
CO2: 26 mmol/L (ref 22–32)
CREATININE: 8.07 mg/dL — AB (ref 0.44–1.00)
GFR calc Af Amer: 5 mL/min — ABNORMAL LOW (ref >60)
GFR calc non Af Amer: 4 mL/min — ABNORMAL LOW (ref >60)
GLUCOSE: 129 mg/dL — AB (ref 65–99)
Potassium: 4.3 mmol/L (ref 3.5–5.1)
SODIUM: 134 mmol/L — AB (ref 135–145)
Total Bilirubin: 1.3 mg/dL — ABNORMAL HIGH (ref 0.3–1.2)
Total Protein: 6.8 g/dL (ref 6.5–8.1)

## 2017-11-08 LAB — I-STAT VENOUS BLOOD GAS, ED
Acid-Base Excess: 3 mmol/L — ABNORMAL HIGH (ref 0.0–2.0)
BICARBONATE: 27.5 mmol/L (ref 20.0–28.0)
O2 SAT: 63 %
PCO2 VEN: 42.2 mmHg — AB (ref 44.0–60.0)
Patient temperature: 37
TCO2: 29 mmol/L (ref 22–32)
pH, Ven: 7.422 (ref 7.250–7.430)
pO2, Ven: 32 mmHg (ref 32.0–45.0)

## 2017-11-08 LAB — BRAIN NATRIURETIC PEPTIDE: B NATRIURETIC PEPTIDE 5: 2422.4 pg/mL — AB (ref 0.0–100.0)

## 2017-11-08 LAB — I-STAT TROPONIN, ED: TROPONIN I, POC: 0.03 ng/mL (ref 0.00–0.08)

## 2017-11-08 MED ORDER — HEPARIN SODIUM (PORCINE) 1000 UNIT/ML DIALYSIS: 20.0000 [IU]/kg | INTRAMUSCULAR | Status: DC | PRN

## 2017-11-08 MED ORDER — PENTAFLUOROPROP-TETRAFLUOROETH EX AERO: 1.0000 "application " | INHALATION_SPRAY | CUTANEOUS | Status: DC | PRN

## 2017-11-08 MED ORDER — ALBUTEROL SULFATE (2.5 MG/3ML) 0.083% IN NEBU
5.0000 mg | INHALATION_SOLUTION | Freq: Once | RESPIRATORY_TRACT | Status: AC
  Administered 2017-11-08: 5 mg via RESPIRATORY_TRACT
  Filled 2017-11-08: qty 6

## 2017-11-08 MED ORDER — ACETAMINOPHEN 325 MG PO TABS
650.0000 mg | ORAL_TABLET | Freq: Four times a day (QID) | ORAL | Status: DC | PRN
  Administered 2017-11-08 – 2017-11-10 (×2): 650 mg via ORAL
  Filled 2017-11-08 (×2): qty 2

## 2017-11-08 MED ORDER — CARVEDILOL 25 MG PO TABS
25.0000 mg | ORAL_TABLET | Freq: Two times a day (BID) | ORAL | Status: DC
  Administered 2017-11-08 – 2017-11-11 (×6): 25 mg via ORAL
  Filled 2017-11-08 (×2): qty 1
  Filled 2017-11-08: qty 2
  Filled 2017-11-08 (×3): qty 1

## 2017-11-08 MED ORDER — SODIUM CHLORIDE 0.9 % IV SOLN: 100.0000 mL | INTRAVENOUS | Status: DC | PRN

## 2017-11-08 MED ORDER — POLYETHYLENE GLYCOL 3350 17 G PO PACK: 17.0000 g | PACK | Freq: Every day | ORAL | Status: DC | PRN

## 2017-11-08 MED ORDER — CALCITRIOL 0.25 MCG PO CAPS
0.2500 ug | ORAL_CAPSULE | ORAL | Status: DC
  Administered 2017-11-10: 0.25 ug via ORAL
  Filled 2017-11-08: qty 1

## 2017-11-08 MED ORDER — DOCUSATE SODIUM 100 MG PO CAPS
200.0000 mg | ORAL_CAPSULE | Freq: Two times a day (BID) | ORAL | Status: DC
  Administered 2017-11-08 – 2017-11-11 (×7): 200 mg via ORAL
  Filled 2017-11-08 (×7): qty 2

## 2017-11-08 MED ORDER — PANTOPRAZOLE SODIUM 40 MG PO TBEC
40.0000 mg | DELAYED_RELEASE_TABLET | Freq: Every day | ORAL | Status: DC
  Administered 2017-11-08 – 2017-11-11 (×4): 40 mg via ORAL
  Filled 2017-11-08 (×4): qty 1

## 2017-11-08 MED ORDER — DEXTROSE 5 % IV SOLN
1.0000 g | Freq: Once | INTRAVENOUS | Status: AC
  Administered 2017-11-08: 1 g via INTRAVENOUS
  Filled 2017-11-08: qty 10

## 2017-11-08 MED ORDER — ONDANSETRON HCL 4 MG/2ML IJ SOLN
INTRAMUSCULAR | Status: AC
  Administered 2017-11-08: 4 mg via INTRAVENOUS
  Filled 2017-11-08: qty 2

## 2017-11-08 MED ORDER — HEPARIN SODIUM (PORCINE) 5000 UNIT/ML IJ SOLN
5000.0000 [IU] | Freq: Three times a day (TID) | INTRAMUSCULAR | Status: DC
  Administered 2017-11-08 – 2017-11-11 (×6): 5000 [IU] via SUBCUTANEOUS
  Filled 2017-11-08 (×8): qty 1

## 2017-11-08 MED ORDER — ONDANSETRON HCL 4 MG/2ML IJ SOLN
4.0000 mg | Freq: Four times a day (QID) | INTRAMUSCULAR | Status: DC | PRN
  Administered 2017-11-08: 4 mg via INTRAVENOUS

## 2017-11-08 MED ORDER — PRO-STAT SUGAR FREE PO LIQD
30.0000 mL | Freq: Two times a day (BID) | ORAL | Status: DC
  Administered 2017-11-08 – 2017-11-11 (×5): 30 mL via ORAL
  Filled 2017-11-08 (×5): qty 30

## 2017-11-08 MED ORDER — RALOXIFENE HCL 60 MG PO TABS
60.0000 mg | ORAL_TABLET | Freq: Every day | ORAL | Status: DC
  Administered 2017-11-08 – 2017-11-11 (×4): 60 mg via ORAL
  Filled 2017-11-08 (×5): qty 1

## 2017-11-08 MED ORDER — LEVOTHYROXINE SODIUM 25 MCG PO TABS
137.0000 ug | ORAL_TABLET | Freq: Every day | ORAL | Status: DC
  Administered 2017-11-08 – 2017-11-11 (×4): 137 ug via ORAL
  Filled 2017-11-08 (×5): qty 1

## 2017-11-08 MED ORDER — LIDOCAINE-PRILOCAINE 2.5-2.5 % EX CREA: 1.0000 "application " | TOPICAL_CREAM | CUTANEOUS | Status: DC | PRN

## 2017-11-08 MED ORDER — ACETAMINOPHEN 650 MG RE SUPP: 650.0000 mg | Freq: Four times a day (QID) | RECTAL | Status: DC | PRN

## 2017-11-08 MED ORDER — DOXYCYCLINE HYCLATE 100 MG PO TABS
100.0000 mg | ORAL_TABLET | Freq: Once | ORAL | Status: AC
  Administered 2017-11-08: 100 mg via ORAL
  Filled 2017-11-08: qty 1

## 2017-11-08 MED ORDER — LIDOCAINE HCL (PF) 1 % IJ SOLN: 5.0000 mL | INTRAMUSCULAR | Status: DC | PRN

## 2017-11-08 MED ORDER — RAMELTEON 8 MG PO TABS
8.0000 mg | ORAL_TABLET | Freq: Every evening | ORAL | Status: DC | PRN
  Administered 2017-11-08: 8 mg via ORAL
  Filled 2017-11-08 (×3): qty 1

## 2017-11-08 MED ORDER — AMLODIPINE BESYLATE 5 MG PO TABS
5.0000 mg | ORAL_TABLET | Freq: Every day | ORAL | Status: DC
Start: 2017-11-08 — End: 2017-11-11
  Administered 2017-11-08 – 2017-11-11 (×3): 5 mg via ORAL
  Filled 2017-11-08 (×3): qty 1

## 2017-11-08 MED ORDER — ALTEPLASE 2 MG IJ SOLR: 2.0000 mg | Freq: Once | INTRAMUSCULAR | Status: DC | PRN

## 2017-11-08 MED ORDER — CALCIUM ACETATE (PHOS BINDER) 667 MG PO CAPS
1334.0000 mg | ORAL_CAPSULE | Freq: Three times a day (TID) | ORAL | Status: DC
  Administered 2017-11-09 – 2017-11-11 (×8): 1334 mg via ORAL
  Filled 2017-11-08 (×8): qty 2

## 2017-11-08 MED ORDER — HEPARIN SODIUM (PORCINE) 1000 UNIT/ML DIALYSIS: 1000.0000 [IU] | INTRAMUSCULAR | Status: DC | PRN

## 2017-11-08 MED ORDER — ASPIRIN EC 81 MG PO TBEC
81.0000 mg | DELAYED_RELEASE_TABLET | Freq: Every day | ORAL | Status: DC
  Administered 2017-11-08 – 2017-11-11 (×4): 81 mg via ORAL
  Filled 2017-11-08 (×4): qty 1

## 2017-11-08 NOTE — ED Triage Notes (Signed)
Per EMS, pt from home. Pt c/o sob and tightness in her throat this morning while getting up for dialysis. Pt also has had cough and phlegm for four weeks. 86% on room air. Placed on 3L went to 90%. 2.5 albuterol neb tx given by ems cleared up pt's wheezing and spo2 brought up to 95%.   EMS VS HR 86, BP 174/92. Hx of HTN.  Pt has fistula in her right arm, dialysis MWF.

## 2017-11-08 NOTE — ED Notes (Signed)
Renal fluid restriction 1200 lunch tray ordered $RemoveBefo'@[id number]$ 

## 2017-11-08 NOTE — ED Provider Notes (Signed)
[facility] EMERGENCY DEPARTMENT Provider Note   CSN: [id number] Arrival date & time: 11/08/17  0515     History   Chief Complaint Chief Complaint  Patient presents with  . Shortness of Breath    HPI Alexis Washington is a 83 y.o. female.  83 year old female with past medical history including ESRD on HD, hypertension who p/w shortness of breath.  Patient states that she has had 4 weeks of cough associated with phlegm production.  She has seen her PCP on 3 different occasions, was given a prescription for amoxicillin 1 week ago for unclear reasons according to family.  She reports no improvement with her shortness of breath.  Family notes that the shortness of breath became much worse over the past 1 day.  She has had some episodes of vomiting which daughter thinks is related to the phlegm.  No fevers or diarrhea.  She has not missed any dialysis sessions recently, is due for dialysis today.  She received albuterol by EMS and reports that it did seem to help briefly.   The history is provided by the patient and a relative.  Shortness of Breath     Past Medical History:  Diagnosis Date  . Anemia in chronic renal disease    ESRD- Sees Dr Justin Mend  . Arthritis   . Benign lipomatous neoplasm of kidney   . Chronic kidney disease, stage V ([place])   . Complication of anesthesia 1998   "blood pressure dropped low during surgery and after surgery it went up really high."   . Diverticulitis   . GERD (gastroesophageal reflux disease)   . Heart murmur    PCP- said it is nothing to be concerned  . HOH (hard of hearing)   . Hyperkalemia   . Hyperphosphatemia   . Hypertension   . Hypertensive kidney disease   . Hypothyroidism   . Nail-patella syndrome   . PONV (postoperative nausea and vomiting)   . Proteinuria   . Shortness of breath dyspnea     Patient Active Problem List   Diagnosis Date Noted  . Acute respiratory failure with hypoxia ([place]) 05/09/2017  .  Hypothyroidism 05/09/2017  . Hypertension   . GERD (gastroesophageal reflux disease)   . Anemia in chronic renal disease   . Hypoxia   . Pleural effusion   . ESRD on dialysis [facility]) 08/07/2015    Past Surgical History:  Procedure Laterality Date  . ABDOMINAL HYSTERECTOMY    . APPENDECTOMY    . AV FISTULA PLACEMENT Right 08/10/2015   Procedure: Right Arm Arteriovenous Brachio-cephalic Fistula ;  Surgeon: Conrad Brookdale, MD;  Location: [place];  Service: Vascular;  Laterality: Right;  . BASCILIC VEIN TRANSPOSITION Right 12/15/2015   Procedure: SECOND STAGE Right Arm BRACHIAL VEIN TRANSPOSITION;  Surgeon: Conrad Stoneboro, MD;  Location: [place];  Service: Vascular;  Laterality: Right;  . CATARACT EXTRACTION W/ INTRAOCULAR LENS  IMPLANT, BILATERAL    . CHOLECYSTECTOMY  1998  . COLONOSCOPY    . FISTULA SUPERFICIALIZATION Right 08/10/2015   Procedure: Right First Stage Brachial Transposition;  Surgeon: Conrad Screven, MD;  Location: [place];  Service: Vascular;  Laterality: Right;  . INSERTION OF DIALYSIS CATHETER N/A 08/10/2015   Procedure: INSERTION OF Right Internal Jugular DIALYSIS CATHETER;  Surgeon: Conrad Rice, MD;  Location: [place];  Service: Vascular;  Laterality: N/A;  . IR THORACENTESIS ASP PLEURAL SPACE W/IMG GUIDE  05/11/2017  . TONSILLECTOMY      OB  History    No data available       Home Medications    Prior to Admission medications   Medication Sig Start Date End Date Taking? Authorizing Provider  acetaminophen (TYLENOL) 500 MG tablet Take 500 mg by mouth every 6 (six) hours as needed (pain).   Yes [provider]  amLODipine (NORVASC) 5 MG tablet Take 5 mg by mouth daily.   Yes [provider]  amoxicillin (AMOXIL) 500 MG capsule Take 500 mg by mouth daily.   Yes [provider]  aspirin EC 81 MG tablet Take 81 mg by mouth daily.   Yes [provider]  calcium acetate (PHOSLO) 667 MG capsule Take 1,334 mg by mouth 3 (three) times daily.  04/21/17   Yes [provider]  carvedilol (COREG) 25 MG tablet Take 25 mg by mouth 2 (two) times daily with a meal.   Yes [provider]  dextromethorphan-guaiFENesin (MUCINEX DM) 30-600 MG 12hr tablet Take 1 tablet by mouth daily as needed for cough.   Yes [provider]  docusate sodium (COLACE) 100 MG capsule Take 1 capsule (100 mg total) by mouth 2 (two) times daily as needed for mild constipation. 05/12/17  Yes Amin, Ankit Chirag, MD  levothyroxine (SYNTHROID, LEVOTHROID) 137 MCG tablet Take 137 mcg by mouth daily. 08/28/17  Yes [provider]  NITROSTAT 0.4 MG SL tablet Place 0.4 mg under the tongue every 5 (five) minutes as needed for chest pain.  10/23/15  Yes [provider]  omeprazole (PRILOSEC) 40 MG capsule Take 40 mg by mouth daily.   Yes [provider]  ondansetron (ZOFRAN ODT) 4 MG disintegrating tablet Take 1 tablet (4 mg total) by mouth every 8 (eight) hours as needed for nausea or vomiting. 05/13/17  Yes Amin, Ankit Chirag, MD  OVER THE COUNTER MEDICATION Place 1 drop into both eyes daily as needed (allergy symptoms). Allergy Relief eye drops   Yes [provider]  polyethylene glycol (MIRALAX / GLYCOLAX) packet Take 17 g by mouth daily as needed. Patient taking differently: Take 17 g by mouth daily as needed for mild constipation.  05/12/17  Yes Amin, Jeanella Flattery, MD  raloxifene (EVISTA) 60 MG tablet Take 60 mg by mouth daily. 04/24/17  Yes [provider]  senna (SENOKOT) 8.6 MG TABS tablet Take 1 tablet (8.6 mg total) by mouth daily as needed for mild constipation. 05/12/17  Yes Damita Lack, MD    Family History Family History  Problem Relation Age of Onset  . Hypertension Mother   . Heart disease Father        before age 51  . Hyperlipidemia Father   . Hypertension Father   . Heart attack Father     Social History Social History   Tobacco Use  . Smoking status: Never Smoker  . Smokeless tobacco:  Never Used  Substance Use Topics  . Alcohol use: No    Alcohol/week: 0.0 oz  . Drug use: No     Allergies   Codeine and Novocain [procaine]   Review of Systems Review of Systems  Respiratory: Positive for shortness of breath.    All other systems reviewed and are negative except that which was mentioned in HPI   Physical Exam Updated Vital Signs BP (!) 205/86   Pulse 80   Temp 97.6 F (36.4 C) (Oral)   Resp 19   Ht $R'5\' 6"'BD$  (1.676 m)   Wt 54.4 kg (120 lb)   SpO2  95%   BMI 19.37 kg/m   Physical Exam  Constitutional: She is oriented to person, place, and time. She appears well-developed and well-nourished. She appears distressed.  Mild respiratory distress  HENT:  Head: Normocephalic and atraumatic.  Moist mucous membranes  Eyes: Conjunctivae are normal. Pupils are equal, round, and reactive to light.  Neck: Neck supple.  Cardiovascular: Normal rate, regular rhythm and normal heart sounds.  No murmur heard. Pulmonary/Chest: Accessory muscle usage present. Tachypnea noted.  Diminished b/l with crackles in bases, faint expiratory wheezes apically  Abdominal: Soft. Bowel sounds are normal. She exhibits no distension. There is no tenderness.  Musculoskeletal: She exhibits no edema.       Right lower leg: She exhibits no edema.       Left lower leg: She exhibits no edema.  Neurological: She is alert and oriented to person, place, and time.  Fluent speech  Skin: Skin is warm and dry.  Psychiatric: Judgment normal. Her mood appears anxious.  Nursing note and vitals reviewed.    ED Treatments / Results  Labs (all labs ordered are listed, but only abnormal results are displayed) Labs Reviewed  COMPREHENSIVE METABOLIC PANEL - Abnormal; Notable for the following components:      Result Value   Sodium 134 (*)    Chloride 94 (*)    Glucose, Bld 129 (*)    BUN 61 (*)    Creatinine, Ser 8.07 (*)    Calcium 8.6 (*)    Total Bilirubin 1.3 (*)    GFR calc non Af Amer 4  (*)    GFR calc Af Amer 5 (*)    All other components within normal limits  CBC WITH DIFFERENTIAL/PLATELET - Abnormal; Notable for the following components:   Hemoglobin 11.2 (*)    HCT 35.4 (*)    All other components within normal limits  BRAIN NATRIURETIC PEPTIDE - Abnormal; Notable for the following components:   B Natriuretic Peptide 2,422.4 (*)    All other components within normal limits  I-STAT VENOUS BLOOD GAS, ED - Abnormal; Notable for the following components:   pCO2, Ven 42.2 (*)    Acid-Base Excess 3.0 (*)    All other components within normal limits  I-STAT TROPONIN, ED    EKG  EKG Interpretation  Date/Time:  Wednesday November 08 2017 05:21:35 EST Ventricular Rate:  88 PR Interval:    QRS Duration: 92 QT Interval:  394 QTC Calculation: 474 R Axis:   40 Text Interpretation:  Sinus rhythm slightly more peaked T waves but otherwise similar to previous Confirmed by Frederick Peers [phone]) on 11/08/2017 5:45:46 AM Also confirmed by Frederick Peers [phone]), editor Elita Quick ([id number])  on 11/08/2017 6:35:22 AM       Radiology Dg Chest 2 View  Result Date: 11/08/2017 CLINICAL DATA:  Acute onset of shortness of breath.  Hypoxia. EXAM: CHEST  2 VIEW COMPARISON:  Chest radiograph performed 05/11/2017 FINDINGS: A small left pleural effusion is noted. Bibasilar airspace opacities may reflect pulmonary edema or possibly pneumonia. No pneumothorax is seen. The cardiomediastinal silhouette is mildly enlarged. No acute osseous abnormalities are identified. IMPRESSION: Small left pleural effusion. Bibasilar airspace opacities may reflect pulmonary edema or possibly pneumonia. Mild cardiomegaly noted. Electronically Signed   By: Roanna Raider M.D.   On: 11/08/2017 07:06    Procedures Procedures (including critical care time)  Medications Ordered in ED Medications  cefTRIAXone (ROCEPHIN) 1 g in dextrose 5 % 50 mL IVPB (1 g Intravenous New Bag/Given 11/08/17  0740)    albuterol (PROVENTIL) (2.5 MG/3ML) 0.083% nebulizer solution 5 mg (5 mg Nebulization Given 11/08/17 0555)  doxycycline (VIBRA-TABS) tablet 100 mg (100 mg Oral Given 11/08/17 0740)     Initial Impression / Assessment and Plan / ED Course  I have reviewed the triage vital signs and the nursing notes.  Pertinent labs & imaging results that were available during my care of the patient were reviewed by me and considered in my medical decision making (see chart for details).     Pt w/ 4wk of cough, now with SOB. Hypoxic and placed on 3L [place], in low 90s on my exam. Elevated BP, afebrile. Crackles noted w/ faint wheezes. Gave albuterol.   Labs show reassuring VBG, normal troponin, stable CBC. BNP 2400, CXR w/ left pleural effusion and bilateral basilar opacities, edema versus pneumonia.  Given the patient's several week history of cough, will cover for pneumonia with ceftriaxone and doxycycline but I also suspect a component of volume overload given BNP and blood pressure.  Discussed admission with internal medicine teaching service who will admit for further care.  I have paged nephrology for consultation and Dr. Oleta Mouse will f/u on their call.  Final Clinical Impressions(s) / ED Diagnoses   Final diagnoses:  None    ED Discharge Orders    None       Little, Wenda Overland, MD 11/08/17 [phone]

## 2017-11-08 NOTE — Consult Note (Addendum)
[facility] Renal Consultation Note    Indication for Consultation:  Management of ESRD/hemodialysis, anemia, hypertension/volume, and secondary hyperparathyroidism. PCP:  HPI: Alexis Washington is a 83 y.o. female with ESRD, HTN, hypothyroidism, GERD, and nail-patella syndrome who is being admitted with pneumonia.   Pt reports that she has been having off and on dyspnea with increased mucus production for the past 4 weeks. On Sunday, she was exposed to sick contact and the next morning, woke up feeling awful. Says her mucus has changed to yellow/brown coloration. Denies fever or chills. Has intermittent chronic nausea/vomiting, worse recently. She presented to the ED early this morning with worsened dyspnea. Work-up showed uncontrolled high BP, normal temperature. Labs showing K 4.3, high BNP, WBC 7.3, Hgb 11.2. Respiratory panel drawn; pending. CXR showed small L pleural effusion and bibasilar air space disease concerning for edema v. pneumonia. She was started on Ceftriaxone and Doxycycline.  From renal standpoint, she usually dialyzes MWF at [facility]. Last HD was Sunday 12/23 (per holiday schedule). She is due for HD today. Has been using R AVF without any recent issues. No CP, dyspnea at this time. Wearing nasal oxygen. No fever, diarrhea.   Past Medical History:  Diagnosis Date  . Anemia in chronic renal disease    ESRD- Sees Dr Hyman Hopes  . Arthritis   . Benign lipomatous neoplasm of kidney   . Chronic kidney disease, stage V (HCC)   . Complication of anesthesia 1998   "blood pressure dropped low during surgery and after surgery it went up really high."   . Diverticulitis   . GERD (gastroesophageal reflux disease)   . Heart murmur    PCP- said it is nothing to be concerned  . HOH (hard of hearing)   . Hyperkalemia   . Hyperphosphatemia   . Hypertension   . Hypertensive kidney disease   . Hypothyroidism   . Nail-patella syndrome   . PONV (postoperative nausea and  vomiting)   . Proteinuria   . Shortness of breath dyspnea    Past Surgical History:  Procedure Laterality Date  . ABDOMINAL HYSTERECTOMY    . APPENDECTOMY    . AV FISTULA PLACEMENT Right 08/10/2015   Procedure: Right Arm Arteriovenous Brachio-cephalic Fistula ;  Surgeon: Fransisco Hertz, MD;  Location: [facility] OR;  Service: Vascular;  Laterality: Right;  . BASCILIC VEIN TRANSPOSITION Right 12/15/2015   Procedure: SECOND STAGE Right Arm BRACHIAL VEIN TRANSPOSITION;  Surgeon: Fransisco Hertz, MD;  Location: [facility] OR;  Service: Vascular;  Laterality: Right;  . CATARACT EXTRACTION W/ INTRAOCULAR LENS  IMPLANT, BILATERAL    . CHOLECYSTECTOMY  1998  . COLONOSCOPY    . FISTULA SUPERFICIALIZATION Right 08/10/2015   Procedure: Right First Stage Brachial Transposition;  Surgeon: Fransisco Hertz, MD;  Location: [facility] OR;  Service: Vascular;  Laterality: Right;  . INSERTION OF DIALYSIS CATHETER N/A 08/10/2015   Procedure: INSERTION OF Right Internal Jugular DIALYSIS CATHETER;  Surgeon: Fransisco Hertz, MD;  Location: [facility] OR;  Service: Vascular;  Laterality: N/A;  . IR THORACENTESIS ASP PLEURAL SPACE W/IMG GUIDE  05/11/2017  . TONSILLECTOMY     Family History  Problem Relation Age of Onset  . Hypertension Mother   . Heart disease Father        before age 10  . Hyperlipidemia Father   . Hypertension Father   . Heart attack Father    Social History:  reports that  has never smoked. she has never used smokeless tobacco. She  reports that she does not drink alcohol or use drugs.  ROS: As per HPI otherwise negative.  Physical Exam: Vitals:   11/08/17 0830 11/08/17 0845 11/08/17 0900 11/08/17 0930  BP: (!) 176/69 (!) 198/84 (!) 193/73 (!) 184/76  Pulse: 73 75 72 74  Resp: (!) $RemoveB'21 16 16 14  'COdilkJQ$ Temp:      TempSrc:      SpO2: 95% 96% 97% 97%  Weight:      Height:         General: Well developed, well nourished, in no acute distress.  Head: Normocephalic, atraumatic, sclera non-icteric, mucus membranes are moist. Neck:  Supple without lymphadenopathy/masses. JVD not elevated. Lungs: Bibasilar rales on exam. No wheezing. Heart: RRR; 3/6 systolic murmur. Abdomen: Soft, non-tender, non-distended with normoactive bowel sounds. No rebound/guarding. No obvious abdominal masses. Musculoskeletal:  Strength and tone appear normal for age. Lower extremities: Trace LE edema. Neuro: Alert and oriented X 3. Moves all extremities spontaneously. Psych:  Responds to questions appropriately with a normal affect. Dialysis Access: RUE AVF + thrill/bruit  Allergies  Allergen Reactions  . Codeine Nausea And Vomiting  . Novocain [Procaine] Palpitations   Prior to Admission medications   Medication Sig Start Date End Date Taking? Authorizing Provider  acetaminophen (TYLENOL) 500 MG tablet Take 500 mg by mouth every 6 (six) hours as needed (pain).   Yes [provider]  amLODipine (NORVASC) 5 MG tablet Take 5 mg by mouth daily.   Yes [provider]  amoxicillin (AMOXIL) 500 MG capsule Take 500 mg by mouth daily.   Yes [provider]  aspirin EC 81 MG tablet Take 81 mg by mouth daily.   Yes [provider]  calcium acetate (PHOSLO) 667 MG capsule Take 1,334 mg by mouth 3 (three) times daily.  04/21/17  Yes [provider]  carvedilol (COREG) 25 MG tablet Take 25 mg by mouth 2 (two) times daily with a meal.   Yes [provider]  dextromethorphan-guaiFENesin (MUCINEX DM) 30-600 MG 12hr tablet Take 1 tablet by mouth daily as needed for cough.   Yes [provider]  docusate sodium (COLACE) 100 MG capsule Take 1 capsule (100 mg total) by mouth 2 (two) times daily as needed for mild constipation. 05/12/17  Yes Amin, Ankit Chirag, MD  levothyroxine (SYNTHROID, LEVOTHROID) 137 MCG tablet Take 137 mcg by mouth daily. 08/28/17  Yes [provider]  NITROSTAT 0.4 MG SL tablet Place 0.4 mg under the tongue every 5 (five) minutes as needed for chest pain.  10/23/15  Yes  [provider]  omeprazole (PRILOSEC) 40 MG capsule Take 40 mg by mouth daily.   Yes [provider]  ondansetron (ZOFRAN ODT) 4 MG disintegrating tablet Take 1 tablet (4 mg total) by mouth every 8 (eight) hours as needed for nausea or vomiting. 05/13/17  Yes Amin, Ankit Chirag, MD  OVER THE COUNTER MEDICATION Place 1 drop into both eyes daily as needed (allergy symptoms). Allergy Relief eye drops   Yes [provider]  polyethylene glycol (MIRALAX / GLYCOLAX) packet Take 17 g by mouth daily as needed. Patient taking differently: Take 17 g by mouth daily as needed for mild constipation.  05/12/17  Yes Amin, Jeanella Flattery, MD  raloxifene (EVISTA) 60 MG tablet Take 60 mg by mouth daily. 04/24/17  Yes [provider]  senna (SENOKOT) 8.6 MG TABS tablet Take 1 tablet (8.6 mg total) by mouth daily as needed for mild constipation. 05/12/17  Yes Amin, Ankit  Chirag, MD   Current Facility-Administered Medications  Medication Dose Route Frequency Provider Last Rate Last Dose  . acetaminophen (TYLENOL) tablet 650 mg  650 mg Oral Q6H PRN Alphonzo Grieve, MD       Or  . acetaminophen (TYLENOL) suppository 650 mg  650 mg Rectal Q6H PRN Alphonzo Grieve, MD      . amLODipine (NORVASC) tablet 5 mg  5 mg Oral Daily Alphonzo Grieve, MD      . aspirin EC tablet 81 mg  81 mg Oral Daily Alphonzo Grieve, MD      . calcium acetate (PHOSLO) capsule 1,334 mg  1,334 mg Oral TID WC Alphonzo Grieve, MD      . carvedilol (COREG) tablet 25 mg  25 mg Oral BID WC Alphonzo Grieve, MD   25 mg at 11/08/17 0905  . docusate sodium (COLACE) capsule 200 mg  200 mg Oral BID Alphonzo Grieve, MD      . heparin injection 5,000 Units  5,000 Units Subcutaneous Q8H Svalina, Gorica, MD      . levothyroxine (SYNTHROID, LEVOTHROID) tablet 137 mcg  137 mcg Oral Daily Svalina, Gorica, MD      . pantoprazole (PROTONIX) EC tablet 40 mg  40 mg Oral Daily Svalina, Gorica, MD      . polyethylene glycol (MIRALAX /  GLYCOLAX) packet 17 g  17 g Oral Daily PRN Alphonzo Grieve, MD      . raloxifene (EVISTA) tablet 60 mg  60 mg Oral Daily Alphonzo Grieve, MD       Current Outpatient Medications  Medication Sig Dispense Refill  . acetaminophen (TYLENOL) 500 MG tablet Take 500 mg by mouth every 6 (six) hours as needed (pain).    Marland Kitchen amLODipine (NORVASC) 5 MG tablet Take 5 mg by mouth daily.    Marland Kitchen amoxicillin (AMOXIL) 500 MG capsule Take 500 mg by mouth daily.    Marland Kitchen aspirin EC 81 MG tablet Take 81 mg by mouth daily.    . calcium acetate (PHOSLO) 667 MG capsule Take 1,334 mg by mouth 3 (three) times daily.   1  . carvedilol (COREG) 25 MG tablet Take 25 mg by mouth 2 (two) times daily with a meal.    . dextromethorphan-guaiFENesin (MUCINEX DM) 30-600 MG 12hr tablet Take 1 tablet by mouth daily as needed for cough.    . docusate sodium (COLACE) 100 MG capsule Take 1 capsule (100 mg total) by mouth 2 (two) times daily as needed for mild constipation. 30 capsule 0  . levothyroxine (SYNTHROID, LEVOTHROID) 137 MCG tablet Take 137 mcg by mouth daily.  0  . NITROSTAT 0.4 MG SL tablet Place 0.4 mg under the tongue every 5 (five) minutes as needed for chest pain.     Marland Kitchen omeprazole (PRILOSEC) 40 MG capsule Take 40 mg by mouth daily.    . ondansetron (ZOFRAN ODT) 4 MG disintegrating tablet Take 1 tablet (4 mg total) by mouth every 8 (eight) hours as needed for nausea or vomiting. 20 tablet 0  . OVER THE COUNTER MEDICATION Place 1 drop into both eyes daily as needed (allergy symptoms). Allergy Relief eye drops    . polyethylene glycol (MIRALAX / GLYCOLAX) packet Take 17 g by mouth daily as needed. (Patient taking differently: Take 17 g by mouth daily as needed for mild constipation. ) 14 each 0  . raloxifene (EVISTA) 60 MG tablet Take 60 mg by mouth daily.  0  . senna (SENOKOT) 8.6 MG TABS tablet Take 1 tablet (8.6 mg  total) by mouth daily as needed for mild constipation. 50 each 0   Labs: Basic Metabolic Panel: Recent Labs  Lab  11/08/17 0531  NA 134*  K 4.3  CL 94*  CO2 26  GLUCOSE 129*  BUN 61*  CREATININE 8.07*  CALCIUM 8.6*   Liver Function Tests: Recent Labs  Lab 11/08/17 0531  AST 27  ALT 31  ALKPHOS 119  BILITOT 1.3*  PROT 6.8  ALBUMIN 3.6   CBC: Recent Labs  Lab 11/08/17 0531  WBC 7.3  NEUTROABS 5.8  HGB 11.2*  HCT 35.4*  MCV 85.9  PLT 198   Studies/Results: Dg Chest 2 View  Result Date: 11/08/2017 CLINICAL DATA:  Acute onset of shortness of breath.  Hypoxia. EXAM: CHEST  2 VIEW COMPARISON:  Chest radiograph performed 05/11/2017 FINDINGS: A small left pleural effusion is noted. Bibasilar airspace opacities may reflect pulmonary edema or possibly pneumonia. No pneumothorax is seen. The cardiomediastinal silhouette is mildly enlarged. No acute osseous abnormalities are identified. IMPRESSION: Small left pleural effusion. Bibasilar airspace opacities may reflect pulmonary edema or possibly pneumonia. Mild cardiomegaly noted. Electronically Signed   By: Garald Balding M.D.   On: 11/08/2017 07:06   Dialysis Orders:  MWF at [facility] 4:00hr, BFR 350, DFR 800, EDW 52kg, 2K/2.25Ca, UF profile 2, Linear Na, AVF, Heparin 1000 - Venofer $RemoveB'50mg'zbuGclQB$  weekly - Mircera 38mcg IV q HD (last given 57mcg on 11/28) - Calcitriol 0.42mcg PO q HD - BMM: Phoslo 2/meals.  Assessment/Plan: 1.  HCAP (?) v. pulmonary edema: On Ceftriaxone and doxycycline. Will UF with HD today. 2.  ESRD: Usually MWF schedule, for HD today.  3.  Hypertension/volume: BP very high, should improve with volume removal. 4.  Anemia: Hgb 11.2. No ESA for now. 5.  Metabolic bone disease: Ca ok, Phos pending. Continue binders/VDRA. 6.  Nutrition:  Alb 3.6, will start pro-stat supps.  Veneta Penton, PA-C 11/08/2017, 10:32 AM  [facility] Pager: [phone]  Pt seen, examined and agree w A/P as above. ESRD pt with SOB, prod cough, no fevers and CXR suggesting fluid overload and possible LLL PNA.   Plan HD w/ max UF  as tolerated.  Will follow.   Kelly Splinter MD [employer] pager [phone]   11/08/2017, 3:11 PM

## 2017-11-08 NOTE — ED Notes (Signed)
ED Provider at bedside. 

## 2017-11-08 NOTE — H&P (Signed)
Date: 11/08/2017               Patient Name:  Alexis Washington MRN: [id number]  DOB: 1932-08-26 Age / Sex: 83 y.o., female   PCP: Rochel Brome, MD         Medical Service: Internal Medicine Teaching Service         Attending Physician: Dr. Lucious Groves, DO    First Contact: Dr. Aggie Hacker Pager: [phone]  Second Contact: Dr. Jari Favre Pager: [phone]       After Hours (After 5p/  First Contact Pager: [phone]  weekends / holidays): Second Contact Pager: [phone]   Chief Complaint: shortness of breath  History of Present Illness:  83 yo female with PMHx of ESRD on HD, hypertension, hypothryoidism, GERD, and chronic diastolic CHF presenting with a chief complaint of SOB. Patient states that she has had a cold for approximately 4 weeks. She reports going to her PCP several times, who initially supportively managed her cold (Mucinex). When she returned for the third time she was placed on Amoxicillin. She has not completed the entire abx course and has about 4-5 pills left. She also has had a productive cough of yellow phlegm for about 4 weeks. She states she decided to come to the ED because last night she was unable to walk around her house without getting very SOB. She typically can get around the house without any DOE; not on home oxygen. She denies fevers, but endorses generalized fatigue, intermittent headaches, and nausea. She has no chest pain or abdominal pain. Per patient's daughter she has had a good appetite and has been eating and drinking well.   She is on MWF HD schedule. Her last HD session was on Sunday, she completed the entire session without any problems. Denies increased leg swelling.   ED Course: Vitals: BP (!) 205/86   Pulse 80   Temp 97.6 F (36.4 C)   Resp 19 SpO2 95%  Labs: Na 134, K 4.3, CO2 26, BUN 61, LFTs WNL; WBC 7.3; Hgb 11.2; BNP 2422 Meds: Albuterol neb, doxycycline 100 mg x 1, started on Ceftriaxone; received albuterol tx by EMS prior to  arrival Imaging: CXR with small left pleural effusion and bibasilar airspace opacities - pulm edema vs pneumonia  Meds:  Current Meds  Medication Sig  . acetaminophen (TYLENOL) 500 MG tablet Take 500 mg by mouth every 6 (six) hours as needed (pain).  Marland Kitchen amLODipine (NORVASC) 5 MG tablet Take 5 mg by mouth daily.  Marland Kitchen amoxicillin (AMOXIL) 500 MG capsule Take 500 mg by mouth daily.  Marland Kitchen aspirin EC 81 MG tablet Take 81 mg by mouth daily.  . calcium acetate (PHOSLO) 667 MG capsule Take 1,334 mg by mouth 3 (three) times daily.   . carvedilol (COREG) 25 MG tablet Take 25 mg by mouth 2 (two) times daily with a meal.  . dextromethorphan-guaiFENesin (MUCINEX DM) 30-600 MG 12hr tablet Take 1 tablet by mouth daily as needed for cough.  . docusate sodium (COLACE) 100 MG capsule Take 1 capsule (100 mg total) by mouth 2 (two) times daily as needed for mild constipation.  Marland Kitchen levothyroxine (SYNTHROID, LEVOTHROID) 137 MCG tablet Take 137 mcg by mouth daily.  Marland Kitchen NITROSTAT 0.4 MG SL tablet Place 0.4 mg under the tongue every 5 (five) minutes as needed for chest pain.   Marland Kitchen omeprazole (PRILOSEC) 40 MG capsule Take 40 mg by mouth daily.  . ondansetron (ZOFRAN ODT) 4 MG disintegrating tablet Take 1  tablet (4 mg total) by mouth every 8 (eight) hours as needed for nausea or vomiting.  Marland Kitchen OVER THE COUNTER MEDICATION Place 1 drop into both eyes daily as needed (allergy symptoms). Allergy Relief eye drops  . polyethylene glycol (MIRALAX / GLYCOLAX) packet Take 17 g by mouth daily as needed. (Patient taking differently: Take 17 g by mouth daily as needed for mild constipation. )  . raloxifene (EVISTA) 60 MG tablet Take 60 mg by mouth daily.  Marland Kitchen senna (SENOKOT) 8.6 MG TABS tablet Take 1 tablet (8.6 mg total) by mouth daily as needed for mild constipation.     Allergies: Allergies as of 11/08/2017 - Review Complete 11/08/2017  Allergen Reaction Noted  . Codeine Nausea And Vomiting 08/06/2015  . Novocain [procaine] Palpitations  08/06/2015   Past Medical History:  Diagnosis Date  . Anemia in chronic renal disease    ESRD- Sees Dr Justin Mend  . Arthritis   . Benign lipomatous neoplasm of kidney   . Chronic kidney disease, stage V ([place])   . Complication of anesthesia 1998   "blood pressure dropped low during surgery and after surgery it went up really high."   . Diverticulitis   . GERD (gastroesophageal reflux disease)   . Heart murmur    PCP- said it is nothing to be concerned  . HOH (hard of hearing)   . Hyperkalemia   . Hyperphosphatemia   . Hypertension   . Hypertensive kidney disease   . Hypothyroidism   . Nail-patella syndrome   . PONV (postoperative nausea and vomiting)   . Proteinuria   . Shortness of breath dyspnea     Family History:  Family History  Problem Relation Age of Onset  . Hypertension Mother   . Heart disease Father        before age 27  . Hyperlipidemia Father   . Hypertension Father   . Heart attack Father     Social History:  Social History   Tobacco Use  . Smoking status: Never Smoker  . Smokeless tobacco: Never Used  Substance Use Topics  . Alcohol use: No    Alcohol/week: 0.0 oz  . Drug use: No    Review of Systems: A complete ROS was negative except as per HPI.   Physical Exam: Blood pressure (!) 196/87, pulse 77, temperature 97.6 F (36.4 C), temperature source Oral, resp. rate 20, height $RemoveBe'5\' 6"'oRSOZXjCU$  (1.676 m), weight 120 lb (54.4 kg), SpO2 93 %.  Physical Exam  Constitutional: She is oriented to person, place, and time. She appears well-developed and well-nourished. No distress.  HENT:  Head: Normocephalic and atraumatic.  Eyes: Conjunctivae and EOM are normal. No scleral icterus.  Neck: Normal range of motion. Neck supple.  Cardiovascular: Normal rate and regular rhythm.  Pulmonary/Chest: Effort normal and breath sounds normal. No respiratory distress.  Decreased bibasilar breath sounds, faint crackles of left lower lung field   Abdominal: Soft. Bowel sounds  are normal. There is no tenderness.  Musculoskeletal: Normal range of motion. She exhibits no edema.  Lymphadenopathy:    She has no cervical adenopathy.  Neurological: She is alert and oriented to person, place, and time. No cranial nerve deficit.  Skin: Skin is warm and dry.  Psychiatric: She has a normal mood and affect.    EKG: personally reviewed my interpretation is sinus rhythm without evidence of acute ischemia, borderline LA enlargement, similar to prior   CXR: personally reviewed my interpretation is small left pleural effusion; bilateral basilar opacities  Assessment & Plan by Problem: Active Problems:   Volume overload  Shortness of Breath, Cough Patient's symptoms most likely multifactorial: volume overload and CAP. CXR with evidence of both. BNP elevated to 2422. She has been afebrile without leukocytosis. S/p 1 dose of ceftriaxone and doxycycline in the ED. Lung exam with decreased bibasilar breath sounds.  -Nephrology consulted in ED for HD today -CBC in AM   ESRD on HD MWF Last HD session on 12/23. BNP elevated consistent with volume overload. Previous EDW documented at 53 kg.  -Nephro on board appreciate recs -HD today -Will likely continue ceftriaxone for CAP  Hypertension Uncotrolled, BP 205/86 on admission, most likely due to volume overload.  -Monitor for improvement following dialysis -Continue home meds: Amlodipine 5 mg, carvedilol 25 mg BID  Chronic Diastolic Heart Failure Most recent ECHO 04/2017; Estimated EF 46-96%, normal systolic function; grade 2 diastolic dysfunction. No peripheral edema on exam.  -Continue carvedilol 25 mg BID  Hypothyroidism -Continue 137 mcg daily  GERD -Continue Protonix 40 mg daily  Osteoporosis -Continue Raloxifene 60 mg daily   CODE Status: DNR confirmed on admission  Dispo: Admit patient to Inpatient with expected length of stay greater than 2 midnights.  Signed: Melanee Spry, MD 11/08/2017, 12:55 PM    Pager: [phone]

## 2017-11-08 NOTE — Progress Notes (Signed)
Pt transferred to the unit to room 5M09 from HD. Pt is alert and oriented x 4 . Spouse is at the bedside,.

## 2017-11-08 NOTE — Procedures (Signed)
   I was present at this dialysis session, have reviewed the session itself and made  appropriate changes Kelly Splinter MD [place] pager [phone]   11/08/2017, 4:50 PM

## 2017-11-09 ENCOUNTER — Inpatient Hospital Stay (HOSPITAL_COMMUNITY): Payer: PPO

## 2017-11-09 DIAGNOSIS — R0602 Shortness of breath: Secondary | ICD-10-CM

## 2017-11-09 DIAGNOSIS — J9601 Acute respiratory failure with hypoxia: Secondary | ICD-10-CM

## 2017-11-09 LAB — CBC
HEMATOCRIT: 32.7 % — AB (ref 36.0–46.0)
HEMOGLOBIN: 9.9 g/dL — AB (ref 12.0–15.0)
MCH: 27 pg (ref 26.0–34.0)
MCHC: 30.3 g/dL (ref 30.0–36.0)
MCV: 89.1 fL (ref 78.0–100.0)
Platelets: 164 10*3/uL (ref 150–400)
RBC: 3.67 MIL/uL — AB (ref 3.87–5.11)
RDW: 15.3 % (ref 11.5–15.5)
WBC: 4 10*3/uL (ref 4.0–10.5)

## 2017-11-09 LAB — RENAL FUNCTION PANEL
ALBUMIN: 3 g/dL — AB (ref 3.5–5.0)
Anion gap: 11 (ref 5–15)
BUN: 39 mg/dL — AB (ref 6–20)
CHLORIDE: 98 mmol/L — AB (ref 101–111)
CO2: 27 mmol/L (ref 22–32)
CREATININE: 6.12 mg/dL — AB (ref 0.44–1.00)
Calcium: 8.1 mg/dL — ABNORMAL LOW (ref 8.9–10.3)
GFR, EST AFRICAN AMERICAN: 7 mL/min — AB (ref >60)
GFR, EST NON AFRICAN AMERICAN: 6 mL/min — AB (ref >60)
Glucose, Bld: 155 mg/dL — ABNORMAL HIGH (ref 65–99)
PHOSPHORUS: 3.6 mg/dL (ref 2.5–4.6)
POTASSIUM: 4.1 mmol/L (ref 3.5–5.1)
Sodium: 136 mmol/L (ref 135–145)

## 2017-11-09 LAB — MRSA PCR SCREENING: MRSA by PCR: NEGATIVE

## 2017-11-09 MED ORDER — HEPARIN SODIUM (PORCINE) 1000 UNIT/ML DIALYSIS: 20.0000 [IU]/kg | INTRAMUSCULAR | Status: DC | PRN

## 2017-11-09 MED ORDER — ORAL CARE MOUTH RINSE
15.0000 mL | Freq: Two times a day (BID) | OROMUCOSAL | Status: DC
  Administered 2017-11-10 – 2017-11-11 (×2): 15 mL via OROMUCOSAL

## 2017-11-09 MED ORDER — SIMETHICONE 80 MG PO CHEW
80.0000 mg | CHEWABLE_TABLET | Freq: Four times a day (QID) | ORAL | Status: DC | PRN
  Administered 2017-11-09: 80 mg via ORAL
  Filled 2017-11-09: qty 1

## 2017-11-09 MED ORDER — DARBEPOETIN ALFA 60 MCG/0.3ML IJ SOSY
60.0000 ug | PREFILLED_SYRINGE | INTRAMUSCULAR | Status: DC
  Administered 2017-11-10: 60 ug via INTRAVENOUS
  Filled 2017-11-09: qty 0.3

## 2017-11-09 NOTE — Progress Notes (Signed)
[facility] Progress Note   Subjective:  Seen in room. Still dyspneic with exertion, although improving. Pulled 2.5L with HD yesterday and down to EDW, although will need to be lowered. Still coughing up phlegm. No CP or abdominal pains.   Objective Vitals:   11/08/17 2112 11/09/17 0243 11/09/17 0551 11/09/17 0849  BP: (!) 147/53  (!) 142/64 (!) 179/69  Pulse: 69  74 70  Resp: $Remo'19  20 20  'Ffwhn$ Temp: 99.3 F (37.4 C)  98.7 F (37.1 C) 98.3 F (36.8 C)  TempSrc: Oral  Oral Oral  SpO2: 95%  94% 96%  Weight: 52.3 kg (115 lb 4.8 oz) 52.3 kg (115 lb 4.8 oz)    Height:       Physical Exam General: Well appearing, elderly female. NAD. On nasal oxygen. Heart: RRR; 3/6 systolic murmur Lungs: Bibasilar rales still present, clear in upper lobes Extremities: No LE edema Dialysis Access: RUE AVF + thrill/bruit  Additional Objective Labs: Basic Metabolic Panel: Recent Labs  Lab 11/08/17 0531  NA 134*  K 4.3  CL 94*  CO2 26  GLUCOSE 129*  BUN 61*  CREATININE 8.07*  CALCIUM 8.6*   Liver Function Tests: Recent Labs  Lab 11/08/17 0531  AST 27  ALT 31  ALKPHOS 119  BILITOT 1.3*  PROT 6.8  ALBUMIN 3.6   CBC: Recent Labs  Lab 11/08/17 0531 11/09/17 0815  WBC 7.3 4.0  NEUTROABS 5.8  --   HGB 11.2* 9.9*  HCT 35.4* 32.7*  MCV 85.9 89.1  PLT 198 164   Studies/Results: Dg Chest 2 View  Result Date: 11/09/2017 CLINICAL DATA:  82 year old female with a history of shortness of breath EXAM: CHEST  2 VIEW COMPARISON:  11/08/2017 FINDINGS: Cardiomediastinal silhouette unchanged, with cardiomegaly and obscuration of left heart border secondary to overlying lung and pleural disease. Dense opacity at the left lung base obscures the left heart border in the left hemidiaphragm. Lateral view demonstrates layered pleural effusion. Hazy opacity at the right lung base. Bilateral coarsened interstitial markings. Blunting at the right costophrenic angle. IMPRESSION: Similar  appearance of the chest x-ray with mixed interstitial and airspace opacities, which are favored to reflect edema, though infection not excluded. Unchanged left greater than right small pleural effusions. Cardiomegaly. Electronically Signed   By: Corrie Mckusick D.O.   On: 11/09/2017 08:49   Dg Chest 2 View  Result Date: 11/08/2017 CLINICAL DATA:  Acute onset of shortness of breath.  Hypoxia. EXAM: CHEST  2 VIEW COMPARISON:  Chest radiograph performed 05/11/2017 FINDINGS: A small left pleural effusion is noted. Bibasilar airspace opacities may reflect pulmonary edema or possibly pneumonia. No pneumothorax is seen. The cardiomediastinal silhouette is mildly enlarged. No acute osseous abnormalities are identified. IMPRESSION: Small left pleural effusion. Bibasilar airspace opacities may reflect pulmonary edema or possibly pneumonia. Mild cardiomegaly noted. Electronically Signed   By: Garald Balding M.D.   On: 11/08/2017 07:06   Medications: . sodium chloride    . sodium chloride     . amLODipine  5 mg Oral Daily  . aspirin EC  81 mg Oral Daily  . calcitRIOL  0.25 mcg Oral Once per day on Mon Wed Fri  . calcium acetate  1,334 mg Oral TID WC  . carvedilol  25 mg Oral BID WC  . docusate sodium  200 mg Oral BID  . feeding supplement (PRO-STAT SUGAR FREE 64)  30 mL Oral BID  . heparin  5,000 Units Subcutaneous Q8H  . levothyroxine  137  mcg Oral QAC breakfast  . mouth rinse  15 mL Mouth Rinse BID  . pantoprazole  40 mg Oral Daily  . raloxifene  60 mg Oral Daily    Dialysis Orders: MWF [facility] 4h   52kg  2/2.25  Hep 1000   AVF 350/800   P2   Linear Na - Venofer $RemoveB'50mg'ydufKMGV$  weekly - Mircera 30mcg IV q HD (last given 32mcg on 11/28) - Calcitriol 0.70mcg PO q HD - BMM: Phoslo 2/meals.  Assessment/Plan: 1.  HCAP (?) v. pulmonary edema: On Ceftriaxone and doxycycline. S/p repeat CXR 12/27 now with more appearance of volume alone. See below.  PCR + for rhinovirus. 2.  ESRD: Usually MWF schedule, CXR  still with significant pulm edema. Will plan on extra HD today for volume, then back to usual MWF schedule tomorrow.  Agree most of her issues appear to be vol overload/ pulm edema/ pleural effusions.  Has lost body wt, plan is for extra HD today and again tomorrow, correct vol overload and establish new dry wt.  3.  Hypertension/volume: BP improved only slightly, UF as above. 4.  Anemia: Hgb 9.9. Will resume ESA today. 5.  Metabolic bone disease: Ca ok, Phos pending. Continue binders/VDRA. 6.  Nutrition:  Alb 3.6, continue pro-stat supps.    Veneta Penton, PA-C 11/09/2017, 9:56 AM  [facility] Pager: [phone]  Pt seen, examined, agree w assess/plan as above with additions as indicated.  Kelly Splinter MD [employer] pager [phone]    cell [phone] 11/09/2017, 11:55 AM

## 2017-11-09 NOTE — Progress Notes (Signed)
   Subjective:  Patient states her breathing is improved from yesterday after HD, but is still short of breath. Also complaining of dry mouth.  No other acute complaints this AM.   Objective:  Vital signs in last 24 hours: Vitals:   11/08/17 1656 11/08/17 2112 11/09/17 0243 11/09/17 0551  BP: (!) 173/75 (!) 147/53  (!) 142/64  Pulse: 67 69  74  Resp: $Remo'18 19  20  'ZrzIA$ Temp: 98.2 F (36.8 C) 99.3 F (37.4 C)  98.7 F (37.1 C)  TempSrc: Oral Oral  Oral  SpO2: 97% 95%  94%  Weight: 115 lb 8.3 oz (52.4 kg) 115 lb 4.8 oz (52.3 kg) 115 lb 4.8 oz (52.3 kg)   Height:       General: Laying in bed comfortably, NAD HEENT: [place]/AT, EOMI, no scleral icterus, PERRL Cardiac: RRR, No R/M/G appreciated Pulm: 2L [place], normal effort, decreased breath sounds at the right lung base, crackles appreciated in left lung base Abd: soft, non tender, non distended, BS normal Ext: extremities well perfused, no peripheral edema Neuro: alert and oriented X3, cranial nerves II-XII grossly intact   Assessment/Plan:  Active Problems:   Volume overload  Shortness of Breath, Cough Patient's symptoms most likely due to volume overload as she had significant improvement in her symptoms after HD. Repeat CXR with stable edema, unable to exclude infection. She has been afebrile without leukocytosis. Respiratory panel was positive for Rhino/enterovirus, which is consistent with her prolonged upper respiratory symptoms.  -Will hold off on abx for now as patient is scheduled for HD fluid removal for new dry weight; if symptoms continue to improve this is likely volume overload and not CAP -will reassess after HD   ESRD on HD MWF BNP elevated on admission consistent with volume overload. S/p HD yesterday. Weight on admission 55 kg, down to 52.3 kg today.  -HD today for fluid removal and lower dry weight -Nephro on board appreciate recs  Hypertension BP elevated 179/69, but had initally improved after HD > 142/64.    -Continue home meds: Amlodipine 5 mg, carvedilol 25 mg BID -Will continue to monitor   Chronic Diastolic Heart Failure Most recent ECHO 04/2017; Estimated EF 16-10%, normal systolic function; grade 2 diastolic dysfunction. No peripheral edema on exam.  -Continue carvedilol 25 mg BID  Hypothyroidism -Continue 137 mcg daily  GERD -Continue Protonix 40 mg daily  Osteoporosis -Continue Raloxifene 60 mg daily     Dispo: Anticipated discharge in approximately 1-2 day(s).   Melanee Spry, MD 11/09/2017, 6:38 AM Pager: [phone]

## 2017-11-10 LAB — CBC
HEMATOCRIT: 30.1 % — AB (ref 36.0–46.0)
Hemoglobin: 9.1 g/dL — ABNORMAL LOW (ref 12.0–15.0)
MCH: 26.2 pg (ref 26.0–34.0)
MCHC: 30.2 g/dL (ref 30.0–36.0)
MCV: 86.7 fL (ref 78.0–100.0)
PLATELETS: 141 10*3/uL — AB (ref 150–400)
RBC: 3.47 MIL/uL — AB (ref 3.87–5.11)
RDW: 14.8 % (ref 11.5–15.5)
WBC: 4.1 10*3/uL (ref 4.0–10.5)

## 2017-11-10 LAB — RENAL FUNCTION PANEL
ALBUMIN: 2.9 g/dL — AB (ref 3.5–5.0)
Anion gap: 15 (ref 5–15)
BUN: 58 mg/dL — AB (ref 6–20)
CHLORIDE: 94 mmol/L — AB (ref 101–111)
CO2: 26 mmol/L (ref 22–32)
CREATININE: 7.95 mg/dL — AB (ref 0.44–1.00)
Calcium: 8.1 mg/dL — ABNORMAL LOW (ref 8.9–10.3)
GFR, EST AFRICAN AMERICAN: 5 mL/min — AB (ref >60)
GFR, EST NON AFRICAN AMERICAN: 4 mL/min — AB (ref >60)
Glucose, Bld: 110 mg/dL — ABNORMAL HIGH (ref 65–99)
PHOSPHORUS: 4.1 mg/dL (ref 2.5–4.6)
Potassium: 4.2 mmol/L (ref 3.5–5.1)
Sodium: 135 mmol/L (ref 135–145)

## 2017-11-10 MED ORDER — DARBEPOETIN ALFA 60 MCG/0.3ML IJ SOSY
PREFILLED_SYRINGE | INTRAMUSCULAR | Status: AC
  Administered 2017-11-10: 60 ug via INTRAVENOUS
  Filled 2017-11-10: qty 0.3

## 2017-11-10 NOTE — Discharge Summary (Signed)
Name: Alexis Washington MRN: [id number] DOB: [birth year] 83 y.o. PCP: Rochel Brome, MD  Date of Admission: 11/08/2017  5:15 AM Date of Discharge: 11/11/2017 Attending Physician: Annia Belt, MD  Discharge Diagnosis:  Active Problems:   ESRD on dialysis [facility])   Anemia in chronic renal disease   Pleural effusion   Volume overload  Discharge Medications: Allergies as of 11/11/2017      Reactions   Codeine Nausea And Vomiting   Novocain [procaine] Palpitations      Medication List    STOP taking these medications   amoxicillin 500 MG capsule Commonly known as:  AMOXIL     TAKE these medications   acetaminophen 500 MG tablet Commonly known as:  TYLENOL Take 500 mg by mouth every 6 (six) hours as needed (pain).   amLODipine 5 MG tablet Commonly known as:  NORVASC Take 5 mg by mouth daily.   aspirin EC 81 MG tablet Take 81 mg by mouth daily.   calcium acetate 667 MG capsule Commonly known as:  PHOSLO Take 1,334 mg by mouth 3 (three) times daily.   carvedilol 25 MG tablet Commonly known as:  COREG Take 25 mg by mouth 2 (two) times daily with a meal.   dextromethorphan-guaiFENesin 30-600 MG 12hr tablet Commonly known as:  MUCINEX DM Take 1 tablet by mouth daily as needed for cough.   docusate sodium 100 MG capsule Commonly known as:  COLACE Take 1 capsule (100 mg total) by mouth 2 (two) times daily as needed for mild constipation.   levothyroxine 137 MCG tablet Commonly known as:  SYNTHROID, LEVOTHROID Take 137 mcg by mouth daily.   NITROSTAT 0.4 MG SL tablet Generic drug:  nitroGLYCERIN Place 0.4 mg under the tongue every 5 (five) minutes as needed for chest pain.   omeprazole 40 MG capsule Commonly known as:  PRILOSEC Take 40 mg by mouth daily.   ondansetron 4 MG disintegrating tablet Commonly known as:  ZOFRAN ODT Take 1 tablet (4 mg total) by mouth every 8 (eight) hours as needed for nausea or vomiting.   OVER THE COUNTER MEDICATION Place  1 drop into both eyes daily as needed (allergy symptoms). Allergy Relief eye drops   polyethylene glycol packet Commonly known as:  MIRALAX / GLYCOLAX Take 17 g by mouth daily as needed. What changed:  reasons to take this   raloxifene 60 MG tablet Commonly known as:  EVISTA Take 60 mg by mouth daily.   senna 8.6 MG Tabs tablet Commonly known as:  SENOKOT Take 1 tablet (8.6 mg total) by mouth daily as needed for mild constipation.       Disposition and follow-up:   [name] was discharged from [facility] in Good condition.  At the hospital follow up visit please address:  1.  Patient presented with signs and symptoms of volume overload. She initially required supplemental oxygen, but was weaned to room air after serial HD sessions to challenge dry weight. Weight on admission 55 kg, weight on discharge 52 kg. Please assess for dyspnea on exertion and compliance with dialysis (new dry weight).   Patient was also found to have signs and symptoms of viral URI, including intermittent productive cough with sputum. Patient's RVP + for rhinovirus. Patient given supportive care with improvement in her symptoms. Please assess at follow up.  2.  Labs / imaging needed at time of follow-up: None  3.  Pending labs/ test needing follow-up: None  Follow-up Appointments: Follow-up  Information    [name], Kirsten, MD. Schedule an appointment as soon as possible for a visit in 1 week(s).   Specialties:  Family Medicine, Interventional Cardiology, Radiology, Anesthesiology Contact information: [address] [place] [address] [place] Hospital Course by problem list: Active Problems:   ESRD on dialysis ([place])   Anemia in chronic renal disease   Pleural effusion   Volume overload   Shortness of breath 2/2 volume overload in setting of ESRD on HD MWF: The patient presented with progressively worsening shortness of breath over the course  of 4 weeks and intermittent productive cough, which she initially attributed to an infection. Prior to presentation she had been prescribed antibiotics by urgent care without relief of her symptoms. Upon arrival to the ED she was found to have elevated BNP, an increased oxygen requirement (hypoxic <85% on room air, >95% on 2L [place]), and chest X ray showing pulmonary congestion and bilateral pleural effusions. These findings, along with her progressive dyspnea on exertion, were most consistent with volume overload. Patient reported that she thought she was over her dry weight on admission and nephrology was consulted for overnight HD. The patient's symptoms dramatically improved with removal of 3L fluid during overnight HD. Her supplemental oxygen was successfully weaned to room air 24 hours prior to discharge. Her weight on admission 55 kg and after serial dialysis sessions during hospitalization her weight was decreased to 52 kg on day of discharge. Patient has plans to follow up for dialysis as an outpatient after discharge and will follow up with nephrologist as previously scheduled.   Cough, viral URI: As described above, patient initially attributed shortness of breath and cough to an infectious process. Her chest imaging, WBC, and temperature where not consistent with pneumonia. She was found to be rhinovirus positive on admission. This was thought to be the source of her initial complaints of productive cough. The patient was treated with supportive care during hospitalization and symptoms improved prior to discharge.  Hypertension: The patient's BP was intermittently elevated into the 170s/80s during hospitalization (205/85 on admission), but improved with dialysis. She was continued on home carvedilol 25 mg BID and amlodipine 5 mg daily. On day of discharge her BP was at goal of <140/90.  Chronic Diastolic Heart Failure: Most recent ECHO in 04/2017 per chart review. It showed 50-55%, with normal  systolic function but grade 2 diastolic dysfunction.The patient had no signs or symptoms of peripheral edema on exam. She was continued on home carvedilol 25 mg BID throughout hospitalization and discharged without changes to this regimen.   Discharge Vitals:   BP (!) 131/55 (BP Location: Left Arm)   Pulse 66   Temp 97.8 F (36.6 C) (Oral)   Resp 16   Ht $R'5\' 6"'Le$  (1.676 m)   Wt 115 lb (52.2 kg) Comment: stand up weight  SpO2 95%   BMI 18.56 kg/m   Pertinent Labs, Studies, and Procedures:   BMP Latest Ref Rng & Units 11/10/2017 11/09/2017 11/08/2017  Glucose 65 - 99 mg/dL 110(H) 155(H) 129(H)  BUN 6 - 20 mg/dL 58(H) 39(H) 61(H)  Creatinine 0.44 - 1.00 mg/dL 7.95(H) 6.12(H) 8.07(H)  Sodium 135 - 145 mmol/L 135 136 134(L)  Potassium 3.5 - 5.1 mmol/L 4.2 4.1 4.3  Chloride 101 - 111 mmol/L 94(L) 98(L) 94(L)  CO2 22 - 32 mmol/L $RemoveB'26 27 26  'HsLMIhnh$ Calcium 8.9 - 10.3 mg/dL 8.1(L) 8.1(L) 8.6(L)   CBC Latest  Ref Rng & Units 11/10/2017 11/09/2017 11/08/2017  WBC 4.0 - 10.5 K/uL 4.1 4.0 7.3  Hemoglobin 12.0 - 15.0 g/dL 9.1(L) 9.9(L) 11.2(L)  Hematocrit 36.0 - 46.0 % 30.1(L) 32.7(L) 35.4(L)  Platelets 150 - 400 K/uL 141(L) 164 198   Respiratory Panel by PCR [[id number]] (Abnormal)   Specimen: Respiratory from Nasopharyngeal Swab    Adenovirus NOT DETECTED   Coronavirus 229E NOT DETECTED   Coronavirus HKU1 NOT DETECTED   Coronavirus NL63 NOT DETECTED   Coronavirus OC43 NOT DETECTED   Metapneumovirus NOT DETECTED   Rhinovirus / Enterovirus DETECTED Abnormal    Influenza A NOT DETECTED   Influenza B NOT DETECTED   Parainfluenza Virus 1 NOT DETECTED   Parainfluenza Virus 2 NOT DETECTED   Parainfluenza Virus 3 NOT DETECTED   Parainfluenza Virus 4 NOT DETECTED   Respiratory Syncytial Virus NOT DETECTED   Bordetella pertussis NOT DETECTED   Chlamydophila pneumoniae NOT DETECTED   Mycoplasma pneumoniae NOT DETECTED   Chest X Ray, 2 view: IMPRESSION: Similar appearance of the chest x-ray with  mixed interstitial and airspace opacities, which are favored to reflect edema, though infection not excluded.  Unchanged left greater than right small pleural effusions.  Cardiomegaly.  Discharge Instructions: Discharge Instructions    Call MD for:  difficulty breathing, headache or visual disturbances   Complete by:  As directed    Call MD for:  persistant nausea and vomiting   Complete by:  As directed    Call MD for:  severe uncontrolled pain   Complete by:  As directed    Call MD for:  temperature >100.4   Complete by:  As directed    Diet - low sodium heart healthy   Complete by:  As directed    Discharge instructions   Complete by:  As directed    Please follow up with your primary care physician regarding your recent hospitalization. Please make an appointment to follow up with them sometime next week. Please go to dialysis on Sunday, 11/12/2017, instead of your regular day on 11/13/2017 since it is the holiday. Then you will go to dialysis as scheduled after this holiday schedule.   Your productive cough was likely a result of an upper respiratory infection. Please continue supportive care with over the counter medications until your cough improves. Since this is a result of a viral infection (rhinovirus) you do not need antibiotics to treat this infection.   Increase activity slowly   Complete by:  As directed       Signed: Thomasene Ripple, MD 11/11/2017, 2:27 PM   Pager: [phone]

## 2017-11-10 NOTE — Procedures (Signed)
Not tolerating much UF on HD today.  Otherwise stable, off of nasal O2.  Will follow.   I was present at this dialysis session, have reviewed the session itself and made  appropriate changes Kelly Splinter MD [place] pager [phone]   11/10/2017, 5:24 PM

## 2017-11-10 NOTE — Progress Notes (Signed)
   Subjective:  Patient states her breathing continues to improve, but continues to complain of an intermittently productive cough with clear and sometimes blood tinged sputum. She states that her breathing is improved and does not think she needs  anymore. She feels overall improved and is anxious about when she can leave the hospital.   Objective:  Vital signs in last 24 hours: Vitals:   11/09/17 1823 11/09/17 2047 11/10/17 0450 11/10/17 0916  BP: (!) 177/67 140/62 (!) 188/58 (!) 176/58  Pulse: 73 96 67 68  Resp: $Remo'17 18 19 18  'llkVy$ Temp: 98.6 F (37 C) 98.7 F (37.1 C) 98.5 F (36.9 C) 97.9 F (36.6 C)  TempSrc: Oral Oral Oral Oral  SpO2: 97% 97% 98% 96%  Weight:  113 lb 1.5 oz (51.3 kg)    Height:       Physical Exam  Constitutional:  Thin appearing elderly woman laying in bed in no acute distress  HENT:  Mouth/Throat: Oropharynx is clear and moist.  Cardiovascular: Normal rate, regular rhythm and intact distal pulses. Exam reveals no gallop and no friction rub.  Murmur (holosystolic murmur at left sternal border) heard. Respiratory: Effort normal. No respiratory distress. She has no wheezes.  Intermittent crackles at bilateral lung bases  GI: Soft. She exhibits no distension. There is tenderness (LLQ at site of previous heparin injections). There is no rebound and no guarding.  Musculoskeletal: She exhibits no edema (of bilateral lower extremities) or tenderness (of bilateral lower extremities).  Skin: Skin is warm and dry. No rash noted. No erythema.   Assessment/Plan:  Active Problems:   ESRD on dialysis ([facility])   Anemia in chronic renal disease   Pleural effusion   Volume overload  Shortness of Breath, Cough: Patient presented with signs and symptoms of volume overload and her breathing has improved with fluid removal during serial HD sessions. Her weight on admission was 55 kg and patient is currently 51 kg. Patient continues to complain of cough, which is likely  associated with + rhinovirus identified on admission. Symptoms gradually improving, consistent with viral URI causing cough.  -Patient currently stable on room air (96% on room air during exam) -Continue supportive care for recommendations  ESRD on HD MWF:  -Nephrology consulted, recommendations appreciated -Hemodialysis today  Hypertension: BP elevated 170s/50s today, but continues to improve into 140s/60s after HD.  -Continue home amlodipine 5 mg, carvedilol 25 mg BID -Will continue to monitor   Chronic Diastolic Heart Failure: Minimal crackles at lung bases. No peripheral edema on exam.  -Continue carvedilol 25 mg BID -Continue dry weight challenge with HD as above  Hypothyroidism -Continue 137 mcg daily  GERD -Continue Protonix 40 mg daily  Osteoporosis -Continue Raloxifene 60 mg daily   Dispo: Anticipated discharge in approximately 1 day.  Thomasene Ripple, MD 11/10/2017, 10:27 AM Pager: [phone]

## 2017-11-11 NOTE — Progress Notes (Signed)
Patient Discharge: Disposition: Patient discharged to home. Education: Reviewed medications, prescriptions, follow-up appointments and discharge instructions, verbalized understanding. IV: Discontinued IV before discharge. Telemetry: Discontinued Tele before discharge, CCMD notified. Transportation: Patient escorted out of the unit in w/c till the ride. Belongings: Patient took all her belongings with her.

## 2017-11-11 NOTE — Progress Notes (Deleted)
[facility] Progress Note   Subjective: Wants to go home. Concerned about wt-no standing wt since she has been in hospital. Denies SOB. Says she feels better.    Objective Vitals:   11/10/17 1627 11/10/17 1820 11/10/17 2029 11/11/17 0541  BP: (!) 150/62 (!) 133/52 (!) 135/52 (!) 162/84  Pulse: 65 71 66 69  Resp: $Remo'17 18 17 16  'RBtyL$ Temp: 98.3 F (36.8 C) 98.3 F (36.8 C) 98.4 F (36.9 C) 98.5 F (36.9 C)  TempSrc: Oral Oral Oral Oral  SpO2: 96% 95% 94% 96%  Weight: 54.4 kg (119 lb 14.9 oz)  54.4 kg (119 lb 14.9 oz)   Height:       Physical Exam General: Frail appearing elderly female in NAD Heart: A6,T0 2/6 systolic M. No JVD Lungs: CTAB slightly decreased in bases  Abdomen: Active BS Extremities: No LE edema. Dialysis Access: RUA AVF + thrill     Additional Objective Labs: Basic Metabolic Panel: Recent Labs  Lab 11/08/17 0531 11/09/17 1934 11/10/17 1449  NA 134* 136 135  K 4.3 4.1 4.2  CL 94* 98* 94*  CO2 $Re'26 27 26  'nXR$ GLUCOSE 129* 155* 110*  BUN 61* 39* 58*  CREATININE 8.07* 6.12* 7.95*  CALCIUM 8.6* 8.1* 8.1*  PHOS  --  3.6 4.1   Liver Function Tests: Recent Labs  Lab 11/08/17 0531 11/09/17 1934 11/10/17 1449  AST 27  --   --   ALT 31  --   --   ALKPHOS 119  --   --   BILITOT 1.3*  --   --   PROT 6.8  --   --   ALBUMIN 3.6 3.0* 2.9*   No results for input(s): LIPASE, AMYLASE in the last 168 hours. CBC: Recent Labs  Lab 11/08/17 0531 11/09/17 0815 11/10/17 1905  WBC 7.3 4.0 4.1  NEUTROABS 5.8  --   --   HGB 11.2* 9.9* 9.1*  HCT 35.4* 32.7* 30.1*  MCV 85.9 89.1 86.7  PLT 198 164 141*   '@lablastinr3'$ @ Studies/Results: Dg Chest 2 View  Result Date: 11/09/2017 CLINICAL DATA:  83 year old female with a history of shortness of breath EXAM: CHEST  2 VIEW COMPARISON:  11/08/2017 FINDINGS: Cardiomediastinal silhouette unchanged, with cardiomegaly and obscuration of left heart border secondary to overlying lung and pleural disease. Dense  opacity at the left lung base obscures the left heart border in the left hemidiaphragm. Lateral view demonstrates layered pleural effusion. Hazy opacity at the right lung base. Bilateral coarsened interstitial markings. Blunting at the right costophrenic angle. IMPRESSION: Similar appearance of the chest x-ray with mixed interstitial and airspace opacities, which are favored to reflect edema, though infection not excluded. Unchanged left greater than right small pleural effusions. Cardiomegaly. Electronically Signed   By: Corrie Mckusick D.O.   On: 11/09/2017 08:49   Medications:  . amLODipine  5 mg Oral Daily  . aspirin EC  81 mg Oral Daily  . calcitRIOL  0.25 mcg Oral Once per day on Mon Wed Fri  . calcium acetate  1,334 mg Oral TID WC  . carvedilol  25 mg Oral BID WC  . darbepoetin (ARANESP) injection - DIALYSIS  60 mcg Intravenous Q Fri-HD  . docusate sodium  200 mg Oral BID  . feeding supplement (PRO-STAT SUGAR FREE 64)  30 mL Oral BID  . heparin  5,000 Units Subcutaneous Q8H  . levothyroxine  137 mcg Oral QAC breakfast  . mouth rinse  15 mL Mouth Rinse BID  .  pantoprazole  40 mg Oral Daily  . raloxifene  60 mg Oral Daily     Dialysis Orders: MWF [facility] 4h   52kg  2/2.25  Hep 1000   AVF 350/800   P2   Linear Na - Venofer $RemoveB'50mg'FdXxpezw$  weekly - Mircera 96mcg IV q HD (last given 65mcg on 11/28) - Calcitriol 0.4mcg PO q HD - BMM: Phoslo 2/meals.  Assessment/Plan: 1. HCAP (?) v. pulmonary edema: S/p repeat CXR 12/27 now with more appearance of volume alone.  PCR + for rhinovirus. Antibiotics Dc'd. Suggest that she was feeling poorly, stopped eating, lost wt and consequently developed pulmonary edema.  2. ESRD:Usually MWF schedule.  3. Hypertension/volume:Appears most of breathing issues related to pulmonary edema and volume overload. Extra HD treatments since adm. HD 11/10/17 Net UF 1504. Post wt 54.4 Do not believe she is still 2 liters above OP EDW. Needs standing wt which she can do  with assistance. Will not be Holiday schedule for HD while in hospital. If Dc'd home will have HD tomorrow.  BP moderately controlled. Cont amlodipine/Coreg  4. Anemia:Hgb 9.1. Rec'd Darbe 60 mcg 11/10/17. Follow HGB.  5. Metabolic bone disease:Ca 8.1 Phos 4.1 Continue binders/VDRA. 6. Nutrition:Alb 2.9, continue pro-stat supps.   Evelina Lore H. Anzley Dibbern NP-C 11/11/2017, 8:32 AM  [employer] [phone]

## 2017-11-11 NOTE — Progress Notes (Signed)
Subjective: Wants to go home. Concerned about wt-no standing wt since she has been in hospital. Denies SOB. Says she feels better.    Objective       Vitals:   11/10/17 1627 11/10/17 1820 11/10/17 2029 11/11/17 0541  BP: (!) 150/62 (!) 133/52 (!) 135/52 (!) 162/84  Pulse: 65 71 66 69  Resp: $Remo'17 18 17 16  'Nmtqk$ Temp: 98.3 F (36.8 C) 98.3 F (36.8 C) 98.4 F (36.9 C) 98.5 F (36.9 C)  TempSrc: Oral Oral Oral Oral  SpO2: 96% 95% 94% 96%  Weight: 54.4 kg (119 lb 14.9 oz)  54.4 kg (119 lb 14.9 oz)   Height:       Physical Exam General: Frail appearing elderly female in NAD Heart: Y7,C6 2/6 systolic M. No JVD Lungs: CTAB slightly decreased in bases  Abdomen: Active BS Extremities: No LE edema. Dialysis Access: RUA AVF + thrill     Additional Objective Labs: Basic Metabolic Panel: LastLabs       Recent Labs  Lab 11/08/17 0531 11/09/17 1934 11/10/17 1449  NA 134* 136 135  K 4.3 4.1 4.2  CL 94* 98* 94*  CO2 $Re'26 27 26  'WAy$ GLUCOSE 129* 155* 110*  BUN 61* 39* 58*  CREATININE 8.07* 6.12* 7.95*  CALCIUM 8.6* 8.1* 8.1*  PHOS  --  3.6 4.1     Liver Function Tests: LastLabs       Recent Labs  Lab 11/08/17 0531 11/09/17 1934 11/10/17 1449  AST 27  --   --   ALT 31  --   --   ALKPHOS 119  --   --   BILITOT 1.3*  --   --   PROT 6.8  --   --   ALBUMIN 3.6 3.0* 2.9*     LastLabs  No results for input(s): LIPASE, AMYLASE in the last 168 hours.   CBC: LastLabs  Recent Labs  Lab 11/08/17 0531 11/09/17 0815 11/10/17 1905  WBC 7.3 4.0 4.1  NEUTROABS 5.8  --   --   HGB 11.2* 9.9* 9.1*  HCT 35.4* 32.7* 30.1*  MCV 85.9 89.1 86.7  PLT 198 164 141*     '@lablastinr3'$ @ Studies/Results:  ImagingResults(Last48hours)  Dg Chest 2 View  Result Date: 11/09/2017 CLINICAL DATA:  83 year old female with a history of shortness of breath EXAM: CHEST  2 VIEW COMPARISON:  11/08/2017 FINDINGS: Cardiomediastinal silhouette unchanged, with cardiomegaly and  obscuration of left heart border secondary to overlying lung and pleural disease. Dense opacity at the left lung base obscures the left heart border in the left hemidiaphragm. Lateral view demonstrates layered pleural effusion. Hazy opacity at the right lung base. Bilateral coarsened interstitial markings. Blunting at the right costophrenic angle. IMPRESSION: Similar appearance of the chest x-ray with mixed interstitial and airspace opacities, which are favored to reflect edema, though infection not excluded. Unchanged left greater than right small pleural effusions. Cardiomegaly. Electronically Signed   By: Corrie Mckusick D.O.   On: 11/09/2017 08:49    Medications: . amLODipine  5 mg Oral Daily  . aspirin EC  81 mg Oral Daily  . calcitRIOL  0.25 mcg Oral Once per day on Mon Wed Fri  . calcium acetate  1,334 mg Oral TID WC  . carvedilol  25 mg Oral BID WC  . darbepoetin (ARANESP) injection - DIALYSIS  60 mcg Intravenous Q Fri-HD  . docusate sodium  200 mg Oral BID  . feeding supplement (PRO-STAT SUGAR FREE 64)  30 mL Oral BID  .  heparin  5,000 Units Subcutaneous Q8H  . levothyroxine  137 mcg Oral QAC breakfast  . mouth rinse  15 mL Mouth Rinse BID  . pantoprazole  40 mg Oral Daily  . raloxifene  60 mg Oral Daily     Dialysis Orders: MWF [facility] 4h 52kg 2/2.25 Hep 1000 AVF 350/800 P2 Linear Na - Venofer $RemoveB'50mg'nnfFfpdm$  weekly - Mircera 12mcg IV q HD (last given 54mcg on 11/28) - Calcitriol 0.37mcg PO q HD - BMM: Phoslo 2/meals.  Assessment/Plan: 1. HCAP (?) v. pulmonary edema: S/p repeat CXR 12/27 now with more appearance of volume alone.PCR + for rhinovirus. Antibiotics Dc'd. Suggest that she was feeling poorly, stopped eating, lost wt and consequently developed pulmonary edema.  2. ESRD:Usually MWF schedule.  3. Hypertension/volume:Appears most of breathing issues related to pulmonary edema and volume overload. Extra HD treatments since adm. HD 11/10/17 Net UF 1504. Post wt  54.4 Do not believe she is still 2 liters above OP EDW. Needs standing wt which she can do with assistance. Will not be Holiday schedule for HD while in hospital. If Dc'd home will have HD tomorrow.  BP moderately controlled. Cont amlodipine/Coreg  Addendum: standing wt 52.2 kg. Is at OP EDW.  4. Anemia:Hgb9.1. Rec'd Darbe 60 mcg 11/10/17. Follow HGB.  5. Metabolic bone disease:Ca 8.1 Phos 4.1 Continue binders/VDRA. 6. Nutrition:Alb 2.9,continuepro-stat supps.   Rita H. Brown NP-C 11/11/2017, 8:32 AM  [facility] [phone]  Pt seen, examined and agree w A/P as above.  Kelly Splinter MD [employer] pager [phone]   11/11/2017, 11:50 AM

## 2017-11-11 NOTE — Discharge Instructions (Signed)

## 2017-11-11 NOTE — Progress Notes (Signed)
   Subjective:  Patient states her breathing continues to improve and feels well enough to go home today. Patient concerned about outpatient dialysis schedule, but knows that she will have to go to HD tomorrow upon discharge today because of holiday schedule (apparently center is closed on Monday). Patient   Objective:  Vital signs in last 24 hours: Vitals:   11/10/17 1820 11/10/17 2029 11/11/17 0541 11/11/17 0919  BP: (!) 133/52 (!) 135/52 (!) 162/84 (!) 131/55  Pulse: 71 66 69 66  Resp: $Remo'18 17 16 16  'JoKJA$ Temp: 98.3 F (36.8 C) 98.4 F (36.9 C) 98.5 F (36.9 C) 97.8 F (36.6 C)  TempSrc: Oral Oral Oral Oral  SpO2: 95% 94% 96% 95%  Weight:  119 lb 14.9 oz (54.4 kg)  115 lb (52.2 kg)  Height:       Physical Exam  Constitutional:  Thin appearing elderly woman sitting comfortably in bedside chair in no acute distress  HENT:  Mouth/Throat: Oropharynx is clear and moist.  Cardiovascular: Normal rate, regular rhythm and intact distal pulses.  Murmur: holosystolic murmur at left sternal border. Respiratory: Effort normal. No respiratory distress. She has no wheezes.  Minimal crackles at bilateral lung bases, interval improvement from yesterday's exam  GI: Soft. She exhibits no distension. There is no tenderness (LLQ at site of previous heparin injections).  Musculoskeletal: She exhibits no edema (of bilateral lower extremities) or tenderness (of bilateral lower extremities).  Skin: Skin is warm and dry. No rash noted. No erythema.   Assessment/Plan:  Active Problems:   ESRD on dialysis (HCC)   Anemia in chronic renal disease   Pleural effusion   Volume overload  Shortness of breath, Cough: Patient states she is feeling much improved and no longer requiring supplemental oxygen. Patient's cough likely 2/2 + rhinovirus as previously discussed. Patient stable for discharge today and agreeable to this plan. -Supportive care for cough after discharge -Discharge today, follow up with  PCP  ESRD on HD MWF: Patient 52.2 kg from admission wt of 55 kg. Interval improvement in crackles since yesterday's exam.  -HD tomorrow (Holiday schedule, per nephrology)  Hypertension: BP currently 130s/50s-60s.  -Continue home amlodipine 5 mg, carvedilol 25 mg BID -Will continue to monitor while inpatient  Chronic Diastolic Heart Failure: Minimal crackles at lung bases. No peripheral edema on exam.  -Continue carvedilol 25 mg BID -Continue dry weight challenge with HD as above  Hypothyroidism -Continue 137 mcg daily  GERD -Continue Protonix 40 mg daily  Osteoporosis -Continue Raloxifene 60 mg daily   Dispo: Anticipated discharge today. Thomasene Ripple, MD 11/11/2017, 12:24 PM Pager: [phone]

## 2017-11-13 DIAGNOSIS — Z992 Dependence on renal dialysis: Secondary | ICD-10-CM | POA: Diagnosis not present

## 2017-11-13 DIAGNOSIS — N186 End stage renal disease: Secondary | ICD-10-CM | POA: Diagnosis not present

## 2017-11-13 DIAGNOSIS — N269 Renal sclerosis, unspecified: Secondary | ICD-10-CM | POA: Diagnosis not present

## 2017-11-15 DIAGNOSIS — E876 Hypokalemia: Secondary | ICD-10-CM | POA: Diagnosis not present

## 2017-11-15 DIAGNOSIS — D689 Coagulation defect, unspecified: Secondary | ICD-10-CM | POA: Diagnosis not present

## 2017-11-15 DIAGNOSIS — D631 Anemia in chronic kidney disease: Secondary | ICD-10-CM | POA: Diagnosis not present

## 2017-11-15 DIAGNOSIS — D509 Iron deficiency anemia, unspecified: Secondary | ICD-10-CM | POA: Diagnosis not present

## 2017-11-15 DIAGNOSIS — N186 End stage renal disease: Secondary | ICD-10-CM | POA: Diagnosis not present

## 2017-11-15 DIAGNOSIS — N2581 Secondary hyperparathyroidism of renal origin: Secondary | ICD-10-CM | POA: Diagnosis not present

## 2017-11-16 DIAGNOSIS — N186 End stage renal disease: Secondary | ICD-10-CM | POA: Diagnosis not present

## 2017-11-16 DIAGNOSIS — I10 Essential (primary) hypertension: Secondary | ICD-10-CM | POA: Diagnosis not present

## 2017-11-16 DIAGNOSIS — D631 Anemia in chronic kidney disease: Secondary | ICD-10-CM | POA: Diagnosis not present

## 2017-11-16 DIAGNOSIS — R0602 Shortness of breath: Secondary | ICD-10-CM | POA: Diagnosis not present

## 2017-11-28 DIAGNOSIS — R0602 Shortness of breath: Secondary | ICD-10-CM | POA: Diagnosis not present

## 2017-11-28 DIAGNOSIS — N2581 Secondary hyperparathyroidism of renal origin: Secondary | ICD-10-CM | POA: Diagnosis not present

## 2017-11-28 DIAGNOSIS — N186 End stage renal disease: Secondary | ICD-10-CM | POA: Diagnosis not present

## 2017-11-28 DIAGNOSIS — E8770 Fluid overload, unspecified: Secondary | ICD-10-CM | POA: Diagnosis not present

## 2017-11-28 DIAGNOSIS — I5033 Acute on chronic diastolic (congestive) heart failure: Secondary | ICD-10-CM | POA: Diagnosis not present

## 2017-12-08 DIAGNOSIS — J449 Chronic obstructive pulmonary disease, unspecified: Secondary | ICD-10-CM | POA: Diagnosis not present

## 2017-12-08 DIAGNOSIS — R0902 Hypoxemia: Secondary | ICD-10-CM | POA: Diagnosis not present

## 2017-12-14 DIAGNOSIS — N269 Renal sclerosis, unspecified: Secondary | ICD-10-CM | POA: Diagnosis not present

## 2017-12-14 DIAGNOSIS — N186 End stage renal disease: Secondary | ICD-10-CM | POA: Diagnosis not present

## 2017-12-14 DIAGNOSIS — Z992 Dependence on renal dialysis: Secondary | ICD-10-CM | POA: Diagnosis not present

## 2017-12-15 DIAGNOSIS — N186 End stage renal disease: Secondary | ICD-10-CM | POA: Diagnosis not present

## 2017-12-15 DIAGNOSIS — Z992 Dependence on renal dialysis: Secondary | ICD-10-CM | POA: Diagnosis not present

## 2017-12-15 DIAGNOSIS — D689 Coagulation defect, unspecified: Secondary | ICD-10-CM | POA: Diagnosis not present

## 2017-12-15 DIAGNOSIS — N2581 Secondary hyperparathyroidism of renal origin: Secondary | ICD-10-CM | POA: Diagnosis not present

## 2017-12-15 DIAGNOSIS — N269 Renal sclerosis, unspecified: Secondary | ICD-10-CM | POA: Diagnosis not present

## 2017-12-15 DIAGNOSIS — D509 Iron deficiency anemia, unspecified: Secondary | ICD-10-CM | POA: Diagnosis not present

## 2017-12-15 DIAGNOSIS — E876 Hypokalemia: Secondary | ICD-10-CM | POA: Diagnosis not present

## 2017-12-15 DIAGNOSIS — D631 Anemia in chronic kidney disease: Secondary | ICD-10-CM | POA: Diagnosis not present

## 2017-12-28 DIAGNOSIS — E782 Mixed hyperlipidemia: Secondary | ICD-10-CM | POA: Diagnosis not present

## 2017-12-28 DIAGNOSIS — R0602 Shortness of breath: Secondary | ICD-10-CM | POA: Diagnosis not present

## 2018-01-11 DIAGNOSIS — N186 End stage renal disease: Secondary | ICD-10-CM | POA: Diagnosis not present

## 2018-01-11 DIAGNOSIS — Z992 Dependence on renal dialysis: Secondary | ICD-10-CM | POA: Diagnosis not present

## 2018-01-11 DIAGNOSIS — N269 Renal sclerosis, unspecified: Secondary | ICD-10-CM | POA: Diagnosis not present

## 2018-01-12 DIAGNOSIS — D689 Coagulation defect, unspecified: Secondary | ICD-10-CM | POA: Diagnosis not present

## 2018-01-12 DIAGNOSIS — N186 End stage renal disease: Secondary | ICD-10-CM | POA: Diagnosis not present

## 2018-01-12 DIAGNOSIS — N269 Renal sclerosis, unspecified: Secondary | ICD-10-CM | POA: Diagnosis not present

## 2018-01-12 DIAGNOSIS — D631 Anemia in chronic kidney disease: Secondary | ICD-10-CM | POA: Diagnosis not present

## 2018-01-12 DIAGNOSIS — E876 Hypokalemia: Secondary | ICD-10-CM | POA: Diagnosis not present

## 2018-01-12 DIAGNOSIS — Z992 Dependence on renal dialysis: Secondary | ICD-10-CM | POA: Diagnosis not present

## 2018-01-12 DIAGNOSIS — N2581 Secondary hyperparathyroidism of renal origin: Secondary | ICD-10-CM | POA: Diagnosis not present

## 2018-01-23 DIAGNOSIS — Z6822 Body mass index (BMI) 22.0-22.9, adult: Secondary | ICD-10-CM | POA: Diagnosis not present

## 2018-01-23 DIAGNOSIS — E038 Other specified hypothyroidism: Secondary | ICD-10-CM | POA: Diagnosis not present

## 2018-01-23 DIAGNOSIS — E782 Mixed hyperlipidemia: Secondary | ICD-10-CM | POA: Diagnosis not present

## 2018-01-23 DIAGNOSIS — R600 Localized edema: Secondary | ICD-10-CM | POA: Diagnosis not present

## 2018-01-23 DIAGNOSIS — I10 Essential (primary) hypertension: Secondary | ICD-10-CM | POA: Diagnosis not present

## 2018-01-23 DIAGNOSIS — N186 End stage renal disease: Secondary | ICD-10-CM | POA: Diagnosis not present

## 2018-01-25 DIAGNOSIS — R0602 Shortness of breath: Secondary | ICD-10-CM | POA: Diagnosis not present

## 2018-01-25 DIAGNOSIS — E782 Mixed hyperlipidemia: Secondary | ICD-10-CM | POA: Diagnosis not present

## 2018-02-11 DIAGNOSIS — N186 End stage renal disease: Secondary | ICD-10-CM | POA: Diagnosis not present

## 2018-02-11 DIAGNOSIS — N269 Renal sclerosis, unspecified: Secondary | ICD-10-CM | POA: Diagnosis not present

## 2018-02-11 DIAGNOSIS — Z992 Dependence on renal dialysis: Secondary | ICD-10-CM | POA: Diagnosis not present

## 2018-02-12 DIAGNOSIS — D631 Anemia in chronic kidney disease: Secondary | ICD-10-CM | POA: Diagnosis not present

## 2018-02-12 DIAGNOSIS — N2581 Secondary hyperparathyroidism of renal origin: Secondary | ICD-10-CM | POA: Diagnosis not present

## 2018-02-12 DIAGNOSIS — Z992 Dependence on renal dialysis: Secondary | ICD-10-CM | POA: Diagnosis not present

## 2018-02-12 DIAGNOSIS — N269 Renal sclerosis, unspecified: Secondary | ICD-10-CM | POA: Diagnosis not present

## 2018-02-12 DIAGNOSIS — N186 End stage renal disease: Secondary | ICD-10-CM | POA: Diagnosis not present

## 2018-02-12 DIAGNOSIS — D689 Coagulation defect, unspecified: Secondary | ICD-10-CM | POA: Diagnosis not present

## 2018-02-25 DIAGNOSIS — E782 Mixed hyperlipidemia: Secondary | ICD-10-CM | POA: Diagnosis not present

## 2018-02-25 DIAGNOSIS — R0602 Shortness of breath: Secondary | ICD-10-CM | POA: Diagnosis not present

## 2018-03-13 DIAGNOSIS — N186 End stage renal disease: Secondary | ICD-10-CM | POA: Diagnosis not present

## 2018-03-13 DIAGNOSIS — Z992 Dependence on renal dialysis: Secondary | ICD-10-CM | POA: Diagnosis not present

## 2018-03-13 DIAGNOSIS — N269 Renal sclerosis, unspecified: Secondary | ICD-10-CM | POA: Diagnosis not present

## 2018-03-14 DIAGNOSIS — N186 End stage renal disease: Secondary | ICD-10-CM | POA: Diagnosis not present

## 2018-03-14 DIAGNOSIS — N2581 Secondary hyperparathyroidism of renal origin: Secondary | ICD-10-CM | POA: Diagnosis not present

## 2018-03-14 DIAGNOSIS — R52 Pain, unspecified: Secondary | ICD-10-CM | POA: Diagnosis not present

## 2018-03-14 DIAGNOSIS — D631 Anemia in chronic kidney disease: Secondary | ICD-10-CM | POA: Diagnosis not present

## 2018-03-14 DIAGNOSIS — D689 Coagulation defect, unspecified: Secondary | ICD-10-CM | POA: Diagnosis not present

## 2018-03-19 DIAGNOSIS — R143 Flatulence: Secondary | ICD-10-CM | POA: Diagnosis not present

## 2018-03-19 DIAGNOSIS — R142 Eructation: Secondary | ICD-10-CM | POA: Diagnosis not present

## 2018-03-19 DIAGNOSIS — I950 Idiopathic hypotension: Secondary | ICD-10-CM | POA: Diagnosis not present

## 2018-03-19 DIAGNOSIS — R5381 Other malaise: Secondary | ICD-10-CM | POA: Diagnosis not present

## 2018-03-19 DIAGNOSIS — R10811 Right upper quadrant abdominal tenderness: Secondary | ICD-10-CM | POA: Diagnosis not present

## 2018-03-19 DIAGNOSIS — Z6822 Body mass index (BMI) 22.0-22.9, adult: Secondary | ICD-10-CM | POA: Diagnosis not present

## 2018-03-19 DIAGNOSIS — I10 Essential (primary) hypertension: Secondary | ICD-10-CM | POA: Diagnosis not present

## 2018-03-27 DIAGNOSIS — R0602 Shortness of breath: Secondary | ICD-10-CM | POA: Diagnosis not present

## 2018-03-27 DIAGNOSIS — E782 Mixed hyperlipidemia: Secondary | ICD-10-CM | POA: Diagnosis not present

## 2018-04-02 ENCOUNTER — Telehealth: Payer: Self-pay

## 2018-04-02 NOTE — Telephone Encounter (Signed)
Alexis Skains, RN with dialysis office in [place] called to inform that Dr. Justin Mend has discontinued the patient's coreg and amlodipine due to hypotension. Patient is set to come in on the 23rd of this month.

## 2018-04-03 DIAGNOSIS — K5700 Diverticulitis of small intestine with perforation and abscess without bleeding: Secondary | ICD-10-CM | POA: Diagnosis not present

## 2018-04-03 DIAGNOSIS — R10811 Right upper quadrant abdominal tenderness: Secondary | ICD-10-CM | POA: Diagnosis not present

## 2018-04-04 DIAGNOSIS — I129 Hypertensive chronic kidney disease with stage 1 through stage 4 chronic kidney disease, or unspecified chronic kidney disease: Secondary | ICD-10-CM | POA: Insufficient documentation

## 2018-04-05 ENCOUNTER — Ambulatory Visit (INDEPENDENT_AMBULATORY_CARE_PROVIDER_SITE_OTHER): Payer: PPO | Admitting: Cardiology

## 2018-04-05 ENCOUNTER — Encounter: Payer: Self-pay | Admitting: Cardiology

## 2018-04-05 VITALS — BP 132/70 | HR 79 | Ht 66.0 in | Wt 123.1 lb

## 2018-04-05 DIAGNOSIS — Z992 Dependence on renal dialysis: Secondary | ICD-10-CM | POA: Diagnosis not present

## 2018-04-05 DIAGNOSIS — I10 Essential (primary) hypertension: Secondary | ICD-10-CM | POA: Diagnosis not present

## 2018-04-05 DIAGNOSIS — N186 End stage renal disease: Secondary | ICD-10-CM

## 2018-04-05 NOTE — Addendum Note (Signed)
Addended by: Mattie Marlin on: 04/05/2018 03:45 PM   Modules accepted: Orders

## 2018-04-05 NOTE — Progress Notes (Signed)
Cardiology Office Note:    Date:  04/05/2018   ID:  Alexis Washington, [place] 17-Mar-1932, MRN [id number]  PCP:  Rochel Brome, MD  Cardiologist:  Jenean Lindau, MD   Referring MD: Rochel Brome, MD    ASSESSMENT:    1. Essential hypertension   2. ESRD on dialysis [facility])    PLAN:    In order of problems listed above:  1. I discussed my findings with the patient at extensive length.  She is a frail 83 year old lady but she has been taking good care of her health.  Her spirits are up.  She has a supportive daughter who helps her with any of her medical needs and is a good support system for her.  I will not make any changes in her medications at this time.  Her blood pressure is stable.Patient will be seen in follow-up appointment in 6 months or earlier if the patient has any concerns    Medication Adjustments/Labs and Tests Ordered: Current medicines are reviewed at length with the patient today.  Concerns regarding medicines are outlined above.  No orders of the defined types were placed in this encounter.  No orders of the defined types were placed in this encounter.    Chief Complaint  Patient presents with  . Follow-up     History of Present Illness:    Alexis Washington is a 84 y.o. female.  The patient has history of essential hypertension.  She is here to see me today.  She is on dialysis for end-stage renal disease.  The patient mentions to me that her blood pressure was low and her nephrologist discontinued her amlodipine and carvedilol.  Subsequently she has done fine.  She keeps a track of her blood pressures and they were low initially but after her medications have been discontinued there is stable.  She is happy with it.  Past Medical History:  Diagnosis Date  . Anemia in chronic renal disease    ESRD- Sees Dr Justin Mend  . Arthritis   . Benign lipomatous neoplasm of kidney   . Complication of anesthesia 1998   "blood pressure dropped low during surgery and after surgery  it went up really high."   . Diverticulitis   . ESRD (end stage renal disease) on dialysis [facility])    "MWF; [facility]" (11/08/2017)  . GERD (gastroesophageal reflux disease)   . Heart murmur    PCP- said it is nothing to be concerned  . HOH (hard of hearing)   . Hyperkalemia   . Hyperphosphatemia   . Hypertension   . Hypertensive kidney disease   . Hypothyroidism   . Nail-patella syndrome   . PONV (postoperative nausea and vomiting)   . Proteinuria   . Shortness of breath dyspnea     Past Surgical History:  Procedure Laterality Date  . ABDOMINAL HYSTERECTOMY    . APPENDECTOMY    . AV FISTULA PLACEMENT Right 08/10/2015   Procedure: Right Arm Arteriovenous Brachio-cephalic Fistula ;  Surgeon: Conrad [place], MD;  Location: [facility];  Service: Vascular;  Laterality: Right;  . BASCILIC VEIN TRANSPOSITION Right 12/15/2015   Procedure: SECOND STAGE Right Arm BRACHIAL VEIN TRANSPOSITION;  Surgeon: Conrad Biscay, MD;  Location: [facility];  Service: Vascular;  Laterality: Right;  . CATARACT EXTRACTION W/ INTRAOCULAR LENS  IMPLANT, BILATERAL    . CHOLECYSTECTOMY  1998  . COLONOSCOPY    . FISTULA SUPERFICIALIZATION Right 08/10/2015   Procedure: Right First Stage Brachial Transposition;  Surgeon: Conrad Hills and Dales, MD;  Location: [facility];  Service: Vascular;  Laterality: Right;  . INSERTION OF DIALYSIS CATHETER N/A 08/10/2015   Procedure: INSERTION OF Right Internal Jugular DIALYSIS CATHETER;  Surgeon: Conrad Ransom, MD;  Location: [facility];  Service: Vascular;  Laterality: N/A;  . IR THORACENTESIS ASP PLEURAL SPACE W/IMG GUIDE  05/11/2017  . TONSILLECTOMY      Current Medications: Current Meds  Medication Sig  . aspirin EC 81 MG tablet Take 81 mg by mouth daily.  . calcium acetate (PHOSLO) 667 MG capsule Take 1,334 mg by mouth 3 (three) times daily.   . carvedilol (COREG) 12.5 MG tablet Take 12.5 mg by mouth 2 (two) times daily.  Marland Kitchen docusate sodium (COLACE) 100 MG capsule Take 1 capsule (100 mg  total) by mouth 2 (two) times daily as needed for mild constipation.  Marland Kitchen levothyroxine (SYNTHROID, LEVOTHROID) 137 MCG tablet Take 137 mcg by mouth daily.  Marland Kitchen NITROSTAT 0.4 MG SL tablet Place 0.4 mg under the tongue every 5 (five) minutes as needed for chest pain.   Marland Kitchen omeprazole (PRILOSEC) 40 MG capsule Take 40 mg by mouth daily.  Marland Kitchen OVER THE COUNTER MEDICATION Place 1 drop into both eyes daily as needed (allergy symptoms). Allergy Relief eye drops  . polyethylene glycol (MIRALAX / GLYCOLAX) packet Take 17 g by mouth daily as needed. (Patient taking differently: Take 17 g by mouth daily as needed for mild constipation. )  . raloxifene (EVISTA) 60 MG tablet Take 60 mg by mouth daily.  . [DISCONTINUED] amLODipine (NORVASC) 10 MG tablet Take 10 mg by mouth daily.  . [DISCONTINUED] ondansetron (ZOFRAN) 4 MG tablet Take 4 mg by mouth every 8 (eight) hours as needed.     Allergies:   Codeine and Novocain [procaine]   Social History   Socioeconomic History  . Marital status: Married    Spouse name: Not on file  . Number of children: Not on file  . Years of education: Not on file  . Highest education level: Not on file  Occupational History  . Not on file  Social Needs  . Financial resource strain: Not on file  . Food insecurity:    Worry: Not on file    Inability: Not on file  . Transportation needs:    Medical: Not on file    Non-medical: Not on file  Tobacco Use  . Smoking status: Never Smoker  . Smokeless tobacco: Never Used  Substance and Sexual Activity  . Alcohol use: No    Alcohol/week: 0.0 oz  . Drug use: No  . Sexual activity: Not on file  Lifestyle  . Physical activity:    Days per week: Not on file    Minutes per session: Not on file  . Stress: Not on file  Relationships  . Social connections:    Talks on phone: Not on file    Gets together: Not on file    Attends religious service: Not on file    Active member of club or organization: Not on file    Attends  meetings of clubs or organizations: Not on file    Relationship status: Not on file  Other Topics Concern  . Not on file  Social History Narrative  . Not on file     Family History: The patient's family history includes Heart attack in her father; Heart disease in her father; Hyperlipidemia in her father; Hypertension in her father and mother.  ROS:   Please see  the history of present illness.    All other systems reviewed and are negative.  EKGs/Labs/Other Studies Reviewed:    The following studies were reviewed today: I reviewed the findings with the patient at length   Recent Labs: 11/08/2017: ALT 31; B Natriuretic Peptide 2,422.4 11/10/2017: BUN 58; Creatinine, Ser 7.95; Hemoglobin 9.1; Platelets 141; Potassium 4.2; Sodium 135  Recent Lipid Panel No results found for: CHOL, TRIG, HDL, CHOLHDL, VLDL, LDLCALC, LDLDIRECT  Physical Exam:    VS:  BP 132/70 (BP Location: Right Arm, Patient Position: Sitting, Cuff Size: Normal)   Pulse 79   Ht $R'5\' 6"'qD$  (1.676 m)   Wt 123 lb 1.9 oz (55.8 kg)   SpO2 96%   BMI 19.87 kg/m     Wt Readings from Last 3 Encounters:  04/05/18 123 lb 1.9 oz (55.8 kg)  11/11/17 115 lb (52.2 kg)  08/31/17 115 lb (52.2 kg)     GEN: Patient is in no acute distress HEENT: Normal NECK: No JVD; No carotid bruits LYMPHATICS: No lymphadenopathy CARDIAC: Hear sounds regular, 2/6 systolic murmur at the apex. RESPIRATORY:  Clear to auscultation without rales, wheezing or rhonchi  ABDOMEN: Soft, non-tender, non-distended MUSCULOSKELETAL:  No edema; No deformity  SKIN: Warm and dry NEUROLOGIC:  Alert and oriented x 3 PSYCHIATRIC:  Normal affect   Signed, Jenean Lindau, MD  04/05/2018 3:39 PM    [place]

## 2018-04-05 NOTE — Patient Instructions (Signed)

## 2018-04-12 DIAGNOSIS — R142 Eructation: Secondary | ICD-10-CM | POA: Diagnosis not present

## 2018-04-12 DIAGNOSIS — J00 Acute nasopharyngitis [common cold]: Secondary | ICD-10-CM | POA: Diagnosis not present

## 2018-04-13 ENCOUNTER — Emergency Department (HOSPITAL_COMMUNITY): Payer: PPO

## 2018-04-13 ENCOUNTER — Encounter (HOSPITAL_COMMUNITY): Payer: Self-pay

## 2018-04-13 ENCOUNTER — Other Ambulatory Visit: Payer: Self-pay

## 2018-04-13 ENCOUNTER — Inpatient Hospital Stay (HOSPITAL_COMMUNITY)
Admission: EM | Admit: 2018-04-13 | Discharge: 2018-04-18 | DRG: 377 | Disposition: A | Discharge status: Home / Self Care | Payer: PPO | Attending: Internal Medicine | Admitting: Internal Medicine

## 2018-04-13 DIAGNOSIS — Z9889 Other specified postprocedural states: Secondary | ICD-10-CM | POA: Diagnosis not present

## 2018-04-13 DIAGNOSIS — I10 Essential (primary) hypertension: Secondary | ICD-10-CM | POA: Diagnosis not present

## 2018-04-13 DIAGNOSIS — N269 Renal sclerosis, unspecified: Secondary | ICD-10-CM | POA: Diagnosis not present

## 2018-04-13 DIAGNOSIS — D649 Anemia, unspecified: Secondary | ICD-10-CM

## 2018-04-13 DIAGNOSIS — Q872 Congenital malformation syndromes predominantly involving limbs: Secondary | ICD-10-CM | POA: Diagnosis not present

## 2018-04-13 DIAGNOSIS — Z992 Dependence on renal dialysis: Secondary | ICD-10-CM

## 2018-04-13 DIAGNOSIS — Q2733 Arteriovenous malformation of digestive system vessel: Secondary | ICD-10-CM | POA: Diagnosis not present

## 2018-04-13 DIAGNOSIS — D62 Acute posthemorrhagic anemia: Secondary | ICD-10-CM | POA: Diagnosis present

## 2018-04-13 DIAGNOSIS — I120 Hypertensive chronic kidney disease with stage 5 chronic kidney disease or end stage renal disease: Secondary | ICD-10-CM | POA: Diagnosis not present

## 2018-04-13 DIAGNOSIS — Z79890 Hormone replacement therapy (postmenopausal): Secondary | ICD-10-CM | POA: Diagnosis not present

## 2018-04-13 DIAGNOSIS — K222 Esophageal obstruction: Secondary | ICD-10-CM | POA: Diagnosis not present

## 2018-04-13 DIAGNOSIS — N186 End stage renal disease: Secondary | ICD-10-CM | POA: Diagnosis present

## 2018-04-13 DIAGNOSIS — K31811 Angiodysplasia of stomach and duodenum with bleeding: Secondary | ICD-10-CM | POA: Diagnosis not present

## 2018-04-13 DIAGNOSIS — D1771 Benign lipomatous neoplasm of kidney: Secondary | ICD-10-CM | POA: Diagnosis present

## 2018-04-13 DIAGNOSIS — R195 Other fecal abnormalities: Secondary | ICD-10-CM | POA: Diagnosis not present

## 2018-04-13 DIAGNOSIS — Z8249 Family history of ischemic heart disease and other diseases of the circulatory system: Secondary | ICD-10-CM | POA: Diagnosis not present

## 2018-04-13 DIAGNOSIS — K31819 Angiodysplasia of stomach and duodenum without bleeding: Secondary | ICD-10-CM

## 2018-04-13 DIAGNOSIS — Z8349 Family history of other endocrine, nutritional and metabolic diseases: Secondary | ICD-10-CM

## 2018-04-13 DIAGNOSIS — R011 Cardiac murmur, unspecified: Secondary | ICD-10-CM | POA: Diagnosis not present

## 2018-04-13 DIAGNOSIS — E039 Hypothyroidism, unspecified: Secondary | ICD-10-CM | POA: Diagnosis not present

## 2018-04-13 DIAGNOSIS — K922 Gastrointestinal hemorrhage, unspecified: Secondary | ICD-10-CM

## 2018-04-13 DIAGNOSIS — N2581 Secondary hyperparathyroidism of renal origin: Secondary | ICD-10-CM | POA: Diagnosis present

## 2018-04-13 DIAGNOSIS — R110 Nausea: Secondary | ICD-10-CM | POA: Diagnosis not present

## 2018-04-13 DIAGNOSIS — K219 Gastro-esophageal reflux disease without esophagitis: Secondary | ICD-10-CM | POA: Diagnosis not present

## 2018-04-13 DIAGNOSIS — R531 Weakness: Secondary | ICD-10-CM | POA: Diagnosis not present

## 2018-04-13 DIAGNOSIS — R946 Abnormal results of thyroid function studies: Secondary | ICD-10-CM | POA: Diagnosis not present

## 2018-04-13 DIAGNOSIS — I4891 Unspecified atrial fibrillation: Secondary | ICD-10-CM | POA: Diagnosis not present

## 2018-04-13 DIAGNOSIS — K921 Melena: Secondary | ICD-10-CM | POA: Diagnosis not present

## 2018-04-13 DIAGNOSIS — K209 Esophagitis, unspecified: Secondary | ICD-10-CM | POA: Diagnosis not present

## 2018-04-13 DIAGNOSIS — D631 Anemia in chronic kidney disease: Secondary | ICD-10-CM | POA: Diagnosis not present

## 2018-04-13 DIAGNOSIS — J811 Chronic pulmonary edema: Secondary | ICD-10-CM | POA: Diagnosis not present

## 2018-04-13 DIAGNOSIS — K5521 Angiodysplasia of colon with hemorrhage: Secondary | ICD-10-CM | POA: Diagnosis not present

## 2018-04-13 DIAGNOSIS — K449 Diaphragmatic hernia without obstruction or gangrene: Secondary | ICD-10-CM | POA: Diagnosis not present

## 2018-04-13 DIAGNOSIS — I480 Paroxysmal atrial fibrillation: Secondary | ICD-10-CM | POA: Diagnosis present

## 2018-04-13 DIAGNOSIS — R0602 Shortness of breath: Secondary | ICD-10-CM | POA: Diagnosis not present

## 2018-04-13 DIAGNOSIS — K210 Gastro-esophageal reflux disease with esophagitis: Secondary | ICD-10-CM | POA: Diagnosis not present

## 2018-04-13 DIAGNOSIS — Z7982 Long term (current) use of aspirin: Secondary | ICD-10-CM

## 2018-04-13 DIAGNOSIS — R05 Cough: Secondary | ICD-10-CM | POA: Diagnosis not present

## 2018-04-13 DIAGNOSIS — Z79899 Other long term (current) drug therapy: Secondary | ICD-10-CM | POA: Diagnosis not present

## 2018-04-13 LAB — IRON AND TIBC: IRON: 22 ug/dL — AB (ref 28–170)

## 2018-04-13 LAB — COMPREHENSIVE METABOLIC PANEL
ALBUMIN: 3.2 g/dL — AB (ref 3.5–5.0)
ALK PHOS: 62 U/L (ref 38–126)
ALT: 16 U/L (ref 14–54)
AST: 21 U/L (ref 15–41)
Anion gap: 14 (ref 5–15)
BUN: 48 mg/dL — AB (ref 6–20)
CO2: 28 mmol/L (ref 22–32)
CREATININE: 8.1 mg/dL — AB (ref 0.44–1.00)
Calcium: 8.6 mg/dL — ABNORMAL LOW (ref 8.9–10.3)
Chloride: 94 mmol/L — ABNORMAL LOW (ref 101–111)
GFR calc Af Amer: 5 mL/min — ABNORMAL LOW (ref >60)
GFR calc non Af Amer: 4 mL/min — ABNORMAL LOW (ref >60)
GLUCOSE: 111 mg/dL — AB (ref 65–99)
POTASSIUM: 3.9 mmol/L (ref 3.5–5.1)
Sodium: 136 mmol/L (ref 135–145)
Total Bilirubin: 0.3 mg/dL (ref 0.3–1.2)
Total Protein: 5.6 g/dL — ABNORMAL LOW (ref 6.5–8.1)

## 2018-04-13 LAB — CBC
HEMATOCRIT: 25.3 % — AB (ref 36.0–46.0)
Hemoglobin: 7.9 g/dL — ABNORMAL LOW (ref 12.0–15.0)
MCH: 28.9 pg (ref 26.0–34.0)
MCHC: 31.2 g/dL (ref 30.0–36.0)
MCV: 92.7 fL (ref 78.0–100.0)
PLATELETS: 151 10*3/uL (ref 150–400)
RBC: 2.73 MIL/uL — ABNORMAL LOW (ref 3.87–5.11)
RDW: 14.1 % (ref 11.5–15.5)
WBC: 3.8 10*3/uL — ABNORMAL LOW (ref 4.0–10.5)

## 2018-04-13 LAB — FERRITIN: Ferritin: 378 ng/mL — ABNORMAL HIGH (ref 11–307)

## 2018-04-13 LAB — POC OCCULT BLOOD, ED
FECAL OCCULT BLD: POSITIVE — AB
Fecal Occult Bld: POSITIVE — AB

## 2018-04-13 LAB — PROTIME-INR
INR: 1.04
PROTHROMBIN TIME: 13.5 s (ref 11.4–15.2)

## 2018-04-13 LAB — ABO/RH: ABO/RH(D): A POS

## 2018-04-13 MED ORDER — LEVOTHYROXINE SODIUM 137 MCG PO TABS
137.0000 ug | ORAL_TABLET | Freq: Every day | ORAL | Status: DC
Start: 2018-04-13 — End: 2018-04-17
  Administered 2018-04-13 – 2018-04-16 (×4): 137 ug via ORAL
  Filled 2018-04-13 (×4): qty 1

## 2018-04-13 MED ORDER — CHLORHEXIDINE GLUCONATE CLOTH 2 % EX PADS
6.0000 | MEDICATED_PAD | Freq: Every day | CUTANEOUS | Status: DC
  Administered 2018-04-15: 6 via TOPICAL

## 2018-04-13 MED ORDER — PANTOPRAZOLE SODIUM 40 MG IV SOLR
40.0000 mg | Freq: Two times a day (BID) | INTRAVENOUS | Status: DC
  Administered 2018-04-13 – 2018-04-17 (×9): 40 mg via INTRAVENOUS
  Filled 2018-04-13 (×13): qty 40

## 2018-04-13 MED ORDER — SODIUM CHLORIDE 0.9 % IV SOLN
INTRAVENOUS | Status: DC
  Administered 2018-04-13 – 2018-04-14 (×2): via INTRAVENOUS

## 2018-04-13 MED ORDER — CALCITRIOL 0.25 MCG PO CAPS
0.5000 ug | ORAL_CAPSULE | ORAL | Status: DC
  Administered 2018-04-16 – 2018-04-18 (×2): 0.5 ug via ORAL

## 2018-04-13 MED ORDER — FLUTICASONE PROPIONATE 50 MCG/ACT NA SUSP
1.0000 | NASAL | Status: DC | PRN
  Filled 2018-04-13: qty 16

## 2018-04-13 MED ORDER — CALCITRIOL 0.25 MCG PO CAPS
0.5000 ug | ORAL_CAPSULE | Freq: Once | ORAL | Status: AC
  Administered 2018-04-16: 0.5 ug via ORAL
  Filled 2018-04-13: qty 1

## 2018-04-13 MED ORDER — ACETAMINOPHEN 325 MG PO TABS
650.0000 mg | ORAL_TABLET | Freq: Four times a day (QID) | ORAL | Status: DC | PRN
  Administered 2018-04-14 – 2018-04-16 (×2): 650 mg via ORAL
  Filled 2018-04-13 (×2): qty 2

## 2018-04-13 MED ORDER — ONDANSETRON HCL 4 MG/2ML IJ SOLN
4.0000 mg | Freq: Four times a day (QID) | INTRAMUSCULAR | Status: DC | PRN
  Administered 2018-04-13 – 2018-04-15 (×2): 4 mg via INTRAVENOUS
  Filled 2018-04-13 (×2): qty 2

## 2018-04-13 MED ORDER — SODIUM CHLORIDE 0.9% FLUSH
3.0000 mL | Freq: Two times a day (BID) | INTRAVENOUS | Status: DC
  Administered 2018-04-13 – 2018-04-17 (×9): 3 mL via INTRAVENOUS

## 2018-04-13 MED ORDER — ACETAMINOPHEN 650 MG RE SUPP: 650.0000 mg | Freq: Four times a day (QID) | RECTAL | Status: DC | PRN

## 2018-04-13 MED ORDER — SODIUM CHLORIDE 0.9 % IV SOLN
80.0000 mg | Freq: Once | INTRAVENOUS | Status: AC
  Administered 2018-04-13: 80 mg via INTRAVENOUS
  Filled 2018-04-13: qty 80

## 2018-04-13 MED ORDER — RALOXIFENE HCL 60 MG PO TABS
60.0000 mg | ORAL_TABLET | Freq: Every day | ORAL | Status: DC
  Administered 2018-04-13 – 2018-04-18 (×6): 60 mg via ORAL
  Filled 2018-04-13 (×8): qty 1

## 2018-04-13 MED ORDER — SIMETHICONE 40 MG/0.6ML PO SUSP
40.0000 mg | Freq: Once | ORAL | Status: AC
  Administered 2018-04-13: 40 mg via ORAL
  Filled 2018-04-13: qty 0.6

## 2018-04-13 MED ORDER — CALCIUM ACETATE (PHOS BINDER) 667 MG PO CAPS
1334.0000 mg | ORAL_CAPSULE | Freq: Three times a day (TID) | ORAL | Status: DC
  Administered 2018-04-13 – 2018-04-15 (×4): 1334 mg via ORAL
  Filled 2018-04-13 (×7): qty 2

## 2018-04-13 MED ORDER — SODIUM CHLORIDE 0.9 % IV SOLN
INTRAVENOUS | Status: DC
  Administered 2018-04-13: 11:00:00 via INTRAVENOUS

## 2018-04-13 MED ORDER — SODIUM CHLORIDE 0.9 % IV SOLN
8.0000 mg/h | INTRAVENOUS | Status: DC
  Administered 2018-04-13: 8 mg/h via INTRAVENOUS
  Filled 2018-04-13: qty 80

## 2018-04-13 NOTE — ED Provider Notes (Addendum)
[facility] EMERGENCY DEPARTMENT Provider Note   CSN: [id number] Arrival date & time: 04/13/18  4431     History   Chief Complaint No chief complaint on file.   HPI Alexis Washington is a 84 y.o. female.  Pt presents to the ED today with low hemoglobin.  The pt is a dialysis patient (MWF) and gets dialysis in [place].  The dialysis center checks her blood every Wednesday.  It was checked on the 29th and Hgb was down to 7.5 from her hgb last week at 10.4.   Pt was told to come to the ED.    Pt has had increased weakness this week, she has noticed some black stool.  She had been getting iron injections at dialysis, but does not know when her last one was.  The pt has been having ruq abd pain for months.  Her pcp did a CT scan on 5/21 at [facility] (in PACS) which was unremarkable for anything acute.  Last colonoscopy at age 30 in [place] by Dr. Lyda Jester.     Past Medical History:  Diagnosis Date  . Anemia in chronic renal disease    ESRD- Sees Dr Justin Mend  . Arthritis   . Benign lipomatous neoplasm of kidney   . Complication of anesthesia 1998   "blood pressure dropped low during surgery and after surgery it went up really high."   . Diverticulitis   . ESRD (end stage renal disease) on dialysis [facility])    "MWF; [place]" (11/08/2017)  . GERD (gastroesophageal reflux disease)   . Heart murmur    PCP- said it is nothing to be concerned  . HOH (hard of hearing)   . Hyperkalemia   . Hyperphosphatemia   . Hypertension   . Hypertensive kidney disease   . Hypothyroidism   . Nail-patella syndrome   . PONV (postoperative nausea and vomiting)   . Proteinuria   . Shortness of breath dyspnea     Patient Active Problem List   Diagnosis Date Noted  . Hypertensive renal disease 04/04/2018  . Volume overload 11/08/2017  . Acute respiratory failure with hypoxia ([place]) 05/09/2017  . Hypothyroidism 05/09/2017  . Hypertension   . GERD  (gastroesophageal reflux disease)   . Anemia in chronic renal disease   . Hypoxia   . Pleural effusion   . Chest pain 01/04/2017  . Pedal edema 01/04/2017  . ESRD on dialysis ([place]) 08/07/2015  . Pre-op evaluation 02/26/2015  . Iron deficiency 02/04/2015  . CKD (chronic kidney disease) stage 5, GFR less than 15 ml/min (HCC) 06/26/2013  . 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 ABDOMINAL HYSTERECTOMY    . APPENDECTOMY    . AV FISTULA PLACEMENT Right 08/10/2015   Procedure: Right Arm Arteriovenous Brachio-cephalic Fistula ;  Surgeon: Conrad Gilgo, MD;  Location: [facility];  Service: Vascular;  Laterality: Right;  . BASCILIC VEIN TRANSPOSITION Right 12/15/2015   Procedure: SECOND STAGE Right Arm BRACHIAL VEIN TRANSPOSITION;  Surgeon: Conrad Kivalina, MD;  Location: [facility];  Service: Vascular;  Laterality: Right;  . CATARACT EXTRACTION W/ INTRAOCULAR LENS  IMPLANT, BILATERAL    . CHOLECYSTECTOMY  1998  . COLONOSCOPY    . FISTULA SUPERFICIALIZATION Right 08/10/2015   Procedure: Right First Stage Brachial Transposition;  Surgeon: Conrad Havelock, MD;  Location: [facility];  Service: Vascular;  Laterality: Right;  . INSERTION OF DIALYSIS CATHETER N/A  08/10/2015   Procedure: INSERTION OF Right Internal Jugular DIALYSIS CATHETER;  Surgeon: Conrad [place], MD;  Location: [facility];  Service: Vascular;  Laterality: N/A;  . IR THORACENTESIS ASP PLEURAL SPACE W/IMG GUIDE  05/11/2017  . TONSILLECTOMY       OB History   None      Home Medications    Prior to Admission medications   Medication Sig Start Date End Date Taking? Authorizing Provider  aspirin EC 81 MG tablet Take 81 mg by mouth daily.    [provider]  calcium acetate (PHOSLO) 667 MG capsule Take 1,334 mg by mouth 3 (three) times daily.  04/21/17   [provider]  docusate sodium (COLACE) 100 MG capsule Take 1 capsule (100 mg total) by mouth 2 (two)  times daily as needed for mild constipation. 05/12/17   Amin, Jeanella Flattery, MD  levothyroxine (SYNTHROID, LEVOTHROID) 137 MCG tablet Take 137 mcg by mouth daily. 08/28/17   [provider]  NITROSTAT 0.4 MG SL tablet Place 0.4 mg under the tongue every 5 (five) minutes as needed for chest pain.  10/23/15   [provider]  omeprazole (PRILOSEC) 40 MG capsule Take 40 mg by mouth daily.    [provider]  OVER THE COUNTER MEDICATION Place 1 drop into both eyes daily as needed (allergy symptoms). Allergy Relief eye drops    [provider]  polyethylene glycol (MIRALAX / GLYCOLAX) packet Take 17 g by mouth daily as needed. Patient taking differently: Take 17 g by mouth daily as needed for mild constipation.  05/12/17   Amin, Jeanella Flattery, MD  raloxifene (EVISTA) 60 MG tablet Take 60 mg by mouth daily. 04/24/17   [provider]    Family History Family History  Problem Relation Age of Onset  . Hypertension Mother   . Heart disease Father        before age 8  . Hyperlipidemia Father   . Hypertension Father   . Heart attack Father     Social History Social History   Tobacco Use  . Smoking status: Never Smoker  . Smokeless tobacco: Never Used  Substance Use Topics  . Alcohol use: No    Alcohol/week: 0.0 oz  . Drug use: No     Allergies   Codeine and Novocain [procaine]   Review of Systems Review of Systems  Constitutional: Positive for fatigue.  Gastrointestinal: Positive for abdominal pain.  All other systems reviewed and are negative.    Physical Exam Updated Vital Signs BP (!) 195/74   Pulse 83   Temp 98.8 F (37.1 C) (Oral)   Resp (!) 21   SpO2 97%   Physical Exam  Constitutional: She is oriented to person, place, and time. She appears well-developed and well-nourished.  HENT:  Head: Normocephalic and atraumatic.  Right Ear: External ear normal.  Left Ear: External ear normal.  Nose: Nose normal.  Mouth/Throat:  Oropharynx is clear and moist.  Eyes: Pupils are equal, round, and reactive to light. Conjunctivae and EOM are normal.  Neck: Normal range of motion. Neck supple.  Cardiovascular: Normal rate, regular rhythm, normal heart sounds and intact distal pulses.  Pulmonary/Chest: Effort normal and breath sounds normal.  Abdominal: Soft. Bowel sounds are normal.  Genitourinary: Rectal exam shows guaiac positive stool.  Musculoskeletal: Normal range of motion.  AVF RUE.  Good thrill.  Neurological: She is oriented to person, place, and time.  Skin: Skin is warm. Capillary refill takes less than 2  seconds.  Psychiatric: She has a normal mood and affect. Her behavior is normal. Judgment and thought content normal.  Nursing note and vitals reviewed.    ED Treatments / Results  Labs (all labs ordered are listed, but only abnormal results are displayed) Labs Reviewed  COMPREHENSIVE METABOLIC PANEL - Abnormal; Notable for the following components:      Result Value   Chloride 94 (*)    Glucose, Bld 111 (*)    BUN 48 (*)    Creatinine, Ser 8.10 (*)    Calcium 8.6 (*)    Total Protein 5.6 (*)    Albumin 3.2 (*)    GFR calc non Af Amer 4 (*)    GFR calc Af Amer 5 (*)    All other components within normal limits  CBC - Abnormal; Notable for the following components:   WBC 3.8 (*)    RBC 2.73 (*)    Hemoglobin 7.9 (*)    HCT 25.3 (*)    All other components within normal limits  POC OCCULT BLOOD, ED - Abnormal; Notable for the following components:   Fecal Occult Bld POSITIVE (*)    All other components within normal limits  POC OCCULT BLOOD, ED - Abnormal; Notable for the following components:   Fecal Occult Bld POSITIVE (*)    All other components within normal limits  PROTIME-INR  TYPE AND SCREEN  ABO/RH    EKG None  Radiology Dg Chest 2 View  Result Date: 04/13/2018 CLINICAL DATA:  Productive cough, shortness of breath EXAM: CHEST - 2 VIEW COMPARISON:  11/09/2017 FINDINGS:  Cardiomegaly. Small left pleural effusion, decreased since prior study. Left lower lobe atelectasis, also improved. No confluent opacity on the right. No acute bony abnormality. IMPRESSION: Small left pleural effusion and left base atelectasis, both improving since prior study. Stable cardiomegaly. Electronically Signed   By: Rolm Baptise M.D.   On: 04/13/2018 10:34       Procedures Procedures (including critical care time)  Medications Ordered in ED Medications  0.9 %  sodium chloride infusion (has no administration in time range)  pantoprazole (PROTONIX) 80 mg in sodium chloride 0.9 % 100 mL IVPB (has no administration in time range)  pantoprazole (PROTONIX) 80 mg in sodium chloride 0.9 % 250 mL (0.32 mg/mL) infusion (has no administration in time range)     Initial Impression / Assessment and Plan / ED Course  I have reviewed the triage vital signs and the nursing notes.  Pertinent labs & imaging results that were available during my care of the patient were reviewed by me and considered in my medical decision making (see chart for details).    Pt d/w Dr. Penelope Coop from [place] who will see patient in consult.  Pt d/w teaching service (unassigned medicine) who will see pt for admission.  CRITICAL CARE Performed by: Isla Pence   Total critical care time: 30 minutes  Critical care time was exclusive of separately billable procedures and treating other patients.  Critical care was necessary to treat or prevent imminent or life-threatening deterioration.  Critical care was time spent personally by me on the following activities: development of treatment plan with patient and/or surrogate as well as nursing, discussions with consultants, evaluation of patient's response to treatment, examination of patient, obtaining history from patient or surrogate, ordering and performing treatments and interventions, ordering and review of laboratory studies, ordering and review of  radiographic studies, pulse oximetry and re-evaluation of patient's condition.   Final Clinical  Impressions(s) / ED Diagnoses   Final diagnoses:  GI bleed  Anemia, unspecified type  ESRD on hemodialysis [facility])    ED Discharge Orders    None       Isla Pence, MD 04/13/18 [place]    Isla Pence, MD 04/14/18 [place], Latrena Benegas, MD 04/30/18 [phone]

## 2018-04-13 NOTE — ED Notes (Signed)
Patient transported to X-ray 

## 2018-04-13 NOTE — ED Notes (Signed)
Pt requesting something to eat. She is given a [place] sandwich meal and a fruit cup. Pt belongings is placed on the bed per pt request.

## 2018-04-13 NOTE — ED Triage Notes (Signed)
Patient was sent from MD for low Hgb. Patient reports that she has had increased weakness this past week. No pain. States that she is due for dialysis today. Alert and oriented

## 2018-04-13 NOTE — ED Notes (Signed)
Kim in laboratory advised they have blue top tube on patient.

## 2018-04-13 NOTE — Consult Note (Signed)
Subjective:   HPI  The patient is an 84 year old female who is I dialysis patient. She was sent to the emergency department and is going to be admitted because of anemia. On the 29th of this month her hemoglobin was 7.5 were as the week before it was 10.4. She has been having some lack colored stools recently. She denies hematemesis. She has been having some right upper quadrant abdominal pain recently and her PCP did a CT scan of her abdomen a couple of weeks ago and did not find anything acute.the CT however did show a right upper pole renal angiomyolipoma which had increased in size over the last 2-1/2 years. It was mention an MRI may be helpful to further assess and confirm benign features.  Review of Systems No chest pain or shortness of breath. Has been feeling a little weak recently  Past Medical History:  Diagnosis Date  . Anemia in chronic renal disease    ESRD- Sees Dr Justin Mend  . Arthritis   . Benign lipomatous neoplasm of kidney   . Complication of anesthesia 1998   "blood pressure dropped low during surgery and after surgery it went up really high."   . Diverticulitis   . ESRD (end stage renal disease) on dialysis [facility])    "MWF; [place]" (11/08/2017)  . GERD (gastroesophageal reflux disease)   . Heart murmur    PCP- said it is nothing to be concerned  . HOH (hard of hearing)   . Hyperkalemia   . Hyperphosphatemia   . Hypertension   . Hypertensive kidney disease   . Hypothyroidism   . Nail-patella syndrome   . PONV (postoperative nausea and vomiting)   . Proteinuria   . Shortness of breath dyspnea    Past Surgical History:  Procedure Laterality Date  . ABDOMINAL HYSTERECTOMY    . APPENDECTOMY    . AV FISTULA PLACEMENT Right 08/10/2015   Procedure: Right Arm Arteriovenous Brachio-cephalic Fistula ;  Surgeon: Conrad Orr, MD;  Location: [facility] [facility];  Service: Vascular;  Laterality: Right;  . BASCILIC VEIN TRANSPOSITION Right 12/15/2015   Procedure: SECOND STAGE  Right Arm BRACHIAL VEIN TRANSPOSITION;  Surgeon: Conrad Ogallala, MD;  Location: [facility];  Service: Vascular;  Laterality: Right;  . CATARACT EXTRACTION W/ INTRAOCULAR LENS  IMPLANT, BILATERAL    . CHOLECYSTECTOMY  1998  . COLONOSCOPY    . FISTULA SUPERFICIALIZATION Right 08/10/2015   Procedure: Right First Stage Brachial Transposition;  Surgeon: Conrad Afton, MD;  Location: [facility];  Service: Vascular;  Laterality: Right;  . INSERTION OF DIALYSIS CATHETER N/A 08/10/2015   Procedure: INSERTION OF Right Internal Jugular DIALYSIS CATHETER;  Surgeon: Conrad Urbank, MD;  Location: [place] [facility];  Service: Vascular;  Laterality: N/A;  . IR THORACENTESIS ASP PLEURAL SPACE W/IMG GUIDE  05/11/2017  . TONSILLECTOMY     Social History   Socioeconomic History  . Marital status: Married    Spouse name: Not on file  . Number of children: Not on file  . Years of education: Not on file  . Highest education level: Not on file  Occupational History  . Not on file  Social Needs  . Financial resource strain: Not on file  . Food insecurity:    Worry: Not on file    Inability: Not on file  . Transportation needs:    Medical: Not on file    Non-medical: Not on file  Tobacco Use  . Smoking status: Never Smoker  . Smokeless  tobacco: Never Used  Substance and Sexual Activity  . Alcohol use: No    Alcohol/week: 0.0 oz  . Drug use: No  . Sexual activity: Not on file  Lifestyle  . Physical activity:    Days per week: Not on file    Minutes per session: Not on file  . Stress: Not on file  Relationships  . Social connections:    Talks on phone: Not on file    Gets together: Not on file    Attends religious service: Not on file    Active member of club or organization: Not on file    Attends meetings of clubs or organizations: Not on file    Relationship status: Not on file  . Intimate partner violence:    Fear of current or ex partner: Not on file    Emotionally abused: Not on file    Physically abused: Not on  file    Forced sexual activity: Not on file  Other Topics Concern  . Not on file  Social History Narrative  . Not on file   family history includes Heart attack in her father; Heart disease in her father; Hyperlipidemia in her father; Hypertension in her father and mother.  Current Facility-Administered Medications:  .  0.9 %  sodium chloride infusion, , Intravenous, Continuous, Isla Pence, MD, Last Rate: 125 mL/hr at 04/13/18 1057 .  pantoprazole (PROTONIX) 80 mg in sodium chloride 0.9 % 250 mL (0.32 mg/mL) infusion, 8 mg/hr, Intravenous, Continuous, Isla Pence, MD, Last Rate: 25 mL/hr at 04/13/18 1123, 8 mg/hr at 04/13/18 1123  Current Outpatient Medications:  .  acetaminophen (TYLENOL) 500 MG tablet, Take 500 mg by mouth every 8 (eight) hours as needed for mild pain., Disp: , Rfl:  .  aspirin EC 81 MG tablet, Take 81 mg by mouth daily., Disp: , Rfl:  .  calcium acetate (PHOSLO) 667 MG capsule, Take 1,334 mg by mouth 3 (three) times daily. , Disp: , Rfl: 1 .  Chlorpheniramine-Acetaminophen (CORICIDIN HBP COLD/FLU PO), Take 1 tablet by mouth as needed (cold symptoms)., Disp: , Rfl:  .  docusate sodium (COLACE) 100 MG capsule, Take 1 capsule (100 mg total) by mouth 2 (two) times daily as needed for mild constipation., Disp: 30 capsule, Rfl: 0 .  fluticasone (FLONASE) 50 MCG/ACT nasal spray, Place 1 spray into both nostrils as needed for allergies or rhinitis., Disp: , Rfl:  .  levothyroxine (SYNTHROID, LEVOTHROID) 137 MCG tablet, Take 137 mcg by mouth daily., Disp: , Rfl: 0 .  Lidocaine-Prilocaine, Bulk, 2.5-2.5 % CREA, Apply 1 application topically as needed (before dialysis)., Disp: , Rfl:  .  NITROSTAT 0.4 MG SL tablet, Place 0.4 mg under the tongue every 5 (five) minutes as needed for chest pain. , Disp: , Rfl:  .  omeprazole (PRILOSEC) 40 MG capsule, Take 40 mg by mouth daily., Disp: , Rfl:  .  OVER THE COUNTER MEDICATION, Place 1 drop into both eyes daily as needed (allergy  symptoms). Allergy Relief eye drops, Disp: , Rfl:  .  polyethylene glycol (MIRALAX / GLYCOLAX) packet, Take 17 g by mouth daily as needed. (Patient taking differently: Take 17 g by mouth daily as needed for mild constipation. ), Disp: 14 each, Rfl: 0 .  Probiotic Product (DIGESTIVE ADVANTAGE) CAPS, Take 1 capsule by mouth daily., Disp: , Rfl:  .  Probiotic Product (PROBIOTIC-10 PO), Take 1 capsule by mouth daily., Disp: , Rfl:  .  raloxifene (EVISTA) 60 MG tablet, Take 60  mg by mouth daily., Disp: , Rfl: 0 Allergies  Allergen Reactions  . Codeine Nausea And Vomiting  . Novocain [Procaine] Palpitations     Objective:     BP (!) 174/77   Pulse 86   Temp 98.8 F (37.1 C) (Oral)   Resp 17   SpO2 94%   No distress  Heart regular rhythm no murmurs  Lungs clear  Abdomen: Bowel sounds present, soft, nontender, no hepatosplenomegaly  Laboratory No components found for: D1    Assessment:     Anemia  Melena. Heme positive in the ER      Plan:     Patient is being admitted to the hospital. Transfuse as needed. We will proceed with EGD tomorrow. Consider renal evaluation for abnormality in right kidney. Lab Results  Component Value Date   HGB 7.9 (L) 04/13/2018   HGB 9.1 (L) 11/10/2017   HGB 9.9 (L) 11/09/2017   HCT 25.3 (L) 04/13/2018   HCT 30.1 (L) 11/10/2017   HCT 32.7 (L) 11/09/2017   ALKPHOS 62 04/13/2018   ALKPHOS 119 11/08/2017   AST 21 04/13/2018   AST 27 11/08/2017   ALT 16 04/13/2018   ALT 31 11/08/2017

## 2018-04-13 NOTE — H&P (View-Only) (Signed)
Subjective:   HPI  The patient is an 84 year old female who is I dialysis patient. She was sent to the emergency department and is going to be admitted because of anemia. On the 29th of this month her hemoglobin was 7.5 were as the week before it was 10.4. She has been having some lack colored stools recently. She denies hematemesis. She has been having some right upper quadrant abdominal pain recently and her PCP did a CT scan of her abdomen a couple of weeks ago and did not find anything acute.the CT however did show a right upper pole renal angiomyolipoma which had increased in size over the last 2-1/2 years. It was mention an MRI may be helpful to further assess and confirm benign features.  Review of Systems No chest pain or shortness of breath. Has been feeling a little weak recently  Past Medical History:  Diagnosis Date  . Anemia in chronic renal disease    ESRD- Sees Dr Justin Mend  . Arthritis   . Benign lipomatous neoplasm of kidney   . Complication of anesthesia 1998   "blood pressure dropped low during surgery and after surgery it went up really high."   . Diverticulitis   . ESRD (end stage renal disease) on dialysis [facility])    "MWF; [facility]" (11/08/2017)  . GERD (gastroesophageal reflux disease)   . Heart murmur    PCP- said it is nothing to be concerned  . HOH (hard of hearing)   . Hyperkalemia   . Hyperphosphatemia   . Hypertension   . Hypertensive kidney disease   . Hypothyroidism   . Nail-patella syndrome   . PONV (postoperative nausea and vomiting)   . Proteinuria   . Shortness of breath dyspnea    Past Surgical History:  Procedure Laterality Date  . ABDOMINAL HYSTERECTOMY    . APPENDECTOMY    . AV FISTULA PLACEMENT Right 08/10/2015   Procedure: Right Arm Arteriovenous Brachio-cephalic Fistula ;  Surgeon: Conrad Kanawha, MD;  Location: [place];  Service: Vascular;  Laterality: Right;  . BASCILIC VEIN TRANSPOSITION Right 12/15/2015   Procedure: SECOND STAGE  Right Arm BRACHIAL VEIN TRANSPOSITION;  Surgeon: Conrad Glenwood, MD;  Location: [place];  Service: Vascular;  Laterality: Right;  . CATARACT EXTRACTION W/ INTRAOCULAR LENS  IMPLANT, BILATERAL    . CHOLECYSTECTOMY  1998  . COLONOSCOPY    . FISTULA SUPERFICIALIZATION Right 08/10/2015   Procedure: Right First Stage Brachial Transposition;  Surgeon: Conrad Navarino, MD;  Location: [place];  Service: Vascular;  Laterality: Right;  . INSERTION OF DIALYSIS CATHETER N/A 08/10/2015   Procedure: INSERTION OF Right Internal Jugular DIALYSIS CATHETER;  Surgeon: Conrad Chesterhill, MD;  Location: [place];  Service: Vascular;  Laterality: N/A;  . IR THORACENTESIS ASP PLEURAL SPACE W/IMG GUIDE  05/11/2017  . TONSILLECTOMY     Social History   Socioeconomic History  . Marital status: Married    Spouse name: Not on file  . Number of children: Not on file  . Years of education: Not on file  . Highest education level: Not on file  Occupational History  . Not on file  Social Needs  . Financial resource strain: Not on file  . Food insecurity:    Worry: Not on file    Inability: Not on file  . Transportation needs:    Medical: Not on file    Non-medical: Not on file  Tobacco Use  . Smoking status: Never Smoker  . Smokeless  tobacco: Never Used  Substance and Sexual Activity  . Alcohol use: No    Alcohol/week: 0.0 oz  . Drug use: No  . Sexual activity: Not on file  Lifestyle  . Physical activity:    Days per week: Not on file    Minutes per session: Not on file  . Stress: Not on file  Relationships  . Social connections:    Talks on phone: Not on file    Gets together: Not on file    Attends religious service: Not on file    Active member of club or organization: Not on file    Attends meetings of clubs or organizations: Not on file    Relationship status: Not on file  . Intimate partner violence:    Fear of current or ex partner: Not on file    Emotionally abused: Not on file    Physically abused: Not on  file    Forced sexual activity: Not on file  Other Topics Concern  . Not on file  Social History Narrative  . Not on file   family history includes Heart attack in her father; Heart disease in her father; Hyperlipidemia in her father; Hypertension in her father and mother.  Current Facility-Administered Medications:  .  0.9 %  sodium chloride infusion, , Intravenous, Continuous, Isla Pence, MD, Last Rate: 125 mL/hr at 04/13/18 1057 .  pantoprazole (PROTONIX) 80 mg in sodium chloride 0.9 % 250 mL (0.32 mg/mL) infusion, 8 mg/hr, Intravenous, Continuous, Isla Pence, MD, Last Rate: 25 mL/hr at 04/13/18 1123, 8 mg/hr at 04/13/18 1123  Current Outpatient Medications:  .  acetaminophen (TYLENOL) 500 MG tablet, Take 500 mg by mouth every 8 (eight) hours as needed for mild pain., Disp: , Rfl:  .  aspirin EC 81 MG tablet, Take 81 mg by mouth daily., Disp: , Rfl:  .  calcium acetate (PHOSLO) 667 MG capsule, Take 1,334 mg by mouth 3 (three) times daily. , Disp: , Rfl: 1 .  Chlorpheniramine-Acetaminophen (CORICIDIN HBP COLD/FLU PO), Take 1 tablet by mouth as needed (cold symptoms)., Disp: , Rfl:  .  docusate sodium (COLACE) 100 MG capsule, Take 1 capsule (100 mg total) by mouth 2 (two) times daily as needed for mild constipation., Disp: 30 capsule, Rfl: 0 .  fluticasone (FLONASE) 50 MCG/ACT nasal spray, Place 1 spray into both nostrils as needed for allergies or rhinitis., Disp: , Rfl:  .  levothyroxine (SYNTHROID, LEVOTHROID) 137 MCG tablet, Take 137 mcg by mouth daily., Disp: , Rfl: 0 .  Lidocaine-Prilocaine, Bulk, 2.5-2.5 % CREA, Apply 1 application topically as needed (before dialysis)., Disp: , Rfl:  .  NITROSTAT 0.4 MG SL tablet, Place 0.4 mg under the tongue every 5 (five) minutes as needed for chest pain. , Disp: , Rfl:  .  omeprazole (PRILOSEC) 40 MG capsule, Take 40 mg by mouth daily., Disp: , Rfl:  .  OVER THE COUNTER MEDICATION, Place 1 drop into both eyes daily as needed (allergy  symptoms). Allergy Relief eye drops, Disp: , Rfl:  .  polyethylene glycol (MIRALAX / GLYCOLAX) packet, Take 17 g by mouth daily as needed. (Patient taking differently: Take 17 g by mouth daily as needed for mild constipation. ), Disp: 14 each, Rfl: 0 .  Probiotic Product (DIGESTIVE ADVANTAGE) CAPS, Take 1 capsule by mouth daily., Disp: , Rfl:  .  Probiotic Product (PROBIOTIC-10 PO), Take 1 capsule by mouth daily., Disp: , Rfl:  .  raloxifene (EVISTA) 60 MG tablet, Take 60  mg by mouth daily., Disp: , Rfl: 0 Allergies  Allergen Reactions  . Codeine Nausea And Vomiting  . Novocain [Procaine] Palpitations     Objective:     BP (!) 174/77   Pulse 86   Temp 98.8 F (37.1 C) (Oral)   Resp 17   SpO2 94%   No distress  Heart regular rhythm no murmurs  Lungs clear  Abdomen: Bowel sounds present, soft, nontender, no hepatosplenomegaly  Laboratory No components found for: D1    Assessment:     Anemia  Melena. Heme positive in the ER      Plan:     Patient is being admitted to the hospital. Transfuse as needed. We will proceed with EGD tomorrow. Consider renal evaluation for abnormality in right kidney. Lab Results  Component Value Date   HGB 7.9 (L) 04/13/2018   HGB 9.1 (L) 11/10/2017   HGB 9.9 (L) 11/09/2017   HCT 25.3 (L) 04/13/2018   HCT 30.1 (L) 11/10/2017   HCT 32.7 (L) 11/09/2017   ALKPHOS 62 04/13/2018   ALKPHOS 119 11/08/2017   AST 21 04/13/2018   AST 27 11/08/2017   ALT 16 04/13/2018   ALT 31 11/08/2017

## 2018-04-13 NOTE — ED Notes (Signed)
GI at bedside

## 2018-04-13 NOTE — ED Notes (Signed)
Please call pts daughter, Sabino Snipes, when you have a time for pt. Dialysis tx. \  [phone]

## 2018-04-13 NOTE — ED Notes (Signed)
Attempted to call to give report. Nurse unable to take report at this time. Will call back in five minutes

## 2018-04-13 NOTE — H&P (Signed)
Date: 04/13/2018               Patient Name:  Alexis Washington MRN: [id number]  DOB: 1932-09-09 Age / Sex: 84 y.o., female   PCP: Rochel Brome, MD         Medical Service: Internal Medicine Teaching Service         Attending Physician: Dr. Sid Falcon, MD    First Contact: Dr. Ronalee Red Pager: [phone]  Second Contact: Dr. Danford Bad  Pager: [phone]       After Hours (After 5p/  First Contact Pager: [phone]  weekends / holidays): Second Contact Pager: [phone]   Chief Complaint: Drop in hemoglobin  History of Present Illness:  This is a 84 y.o. woman with PMHx of ESRD 2/2 HTN (on HD MWF), HTN, GERD, hypothyroidism, nail-patella syndrome, heart murmur who presents to the ED at the request of her outpatient HD center for concern of blood loss anemia.  Patient reports having blood drawn at dialysis on Wednesday and being told her hemoglobin was 7.5 which is a drop from 10.4 one week prior.  She was thus told to come to the ED. Patient reports she has felt more weak and tired this week, unable to do her usual household tasks.  She has also noticed melena and more frequent bowel movements that have been soft.  This is a change from her usual hard bowel movements where she has to strain.  She is on baby ASA but otherwise avoids NSAIDs.  She does not drink EtOH or use tobacco.  Her last colonoscopy was 9 years ago and reportedly normal.  She did complain of abdominal discomfort ongoing for several months described as intermittent tightness across her abdomen.  Her PCP did a CT scan 10 days ago that was not notable for anything acute but did not multiple diverticula.  She otherwise feels okay without CP, SOB, vomiting, fever, chills, presyncope.  She has some subjective SOB and nausea this week.    In the ED, her vitals were notable for hypertensive, afebrile, normal oxygen saturation on room air.  She was typed and screened, FOBT was positive, PT/INR normal, CBC confirmed hemoglobin of 7.9, MCV 92,  normal platelets.  WBC is 3.8.  CMET was consistent with ESRD.  GI was consulted in the ED for GI bleed, patient was started on PPI infusion and NS IV fluids.    Meds:  Current Meds  Medication Sig  . acetaminophen (TYLENOL) 500 MG tablet Take 500 mg by mouth every 8 (eight) hours as needed for mild pain.  Marland Kitchen aspirin EC 81 MG tablet Take 81 mg by mouth daily.  . calcium acetate (PHOSLO) 667 MG capsule Take 1,334 mg by mouth 3 (three) times daily.   . Chlorpheniramine-Acetaminophen (CORICIDIN HBP COLD/FLU PO) Take 1 tablet by mouth as needed (cold symptoms).  . docusate sodium (COLACE) 100 MG capsule Take 1 capsule (100 mg total) by mouth 2 (two) times daily as needed for mild constipation.  . fluticasone (FLONASE) 50 MCG/ACT nasal spray Place 1 spray into both nostrils as needed for allergies or rhinitis.  Marland Kitchen levothyroxine (SYNTHROID, LEVOTHROID) 137 MCG tablet Take 137 mcg by mouth daily.  . Lidocaine-Prilocaine, Bulk, 2.5-2.5 % CREA Apply 1 application topically as needed (before dialysis).  Marland Kitchen NITROSTAT 0.4 MG SL tablet Place 0.4 mg under the tongue every 5 (five) minutes as needed for chest pain.   Marland Kitchen omeprazole (PRILOSEC) 40 MG capsule Take 40 mg by mouth daily.  Marland Kitchen  OVER THE COUNTER MEDICATION Place 1 drop into both eyes daily as needed (allergy symptoms). Allergy Relief eye drops  . polyethylene glycol (MIRALAX / GLYCOLAX) packet Take 17 g by mouth daily as needed. (Patient taking differently: Take 17 g by mouth daily as needed for mild constipation. )  . Probiotic Product (DIGESTIVE ADVANTAGE) CAPS Take 1 capsule by mouth daily.  . Probiotic Product (PROBIOTIC-10 PO) Take 1 capsule by mouth daily.  . raloxifene (EVISTA) 60 MG tablet Take 60 mg by mouth daily.     Allergies: Allergies as of 04/13/2018 - Review Complete 04/13/2018  Allergen Reaction Noted  . Codeine Nausea And Vomiting 08/06/2015  . Novocain [procaine] Palpitations 08/06/2015   Past Medical History:  Diagnosis Date  .  Anemia in chronic renal disease    ESRD- Sees Dr Hyman Hopes  . Arthritis   . Benign lipomatous neoplasm of kidney   . Complication of anesthesia 1998   "blood pressure dropped low during surgery and after surgery it went up really high."   . Diverticulitis   . ESRD (end stage renal disease) on dialysis [facility])    "MWF; [place]" (11/08/2017)  . GERD (gastroesophageal reflux disease)   . Heart murmur    PCP- said it is nothing to be concerned  . HOH (hard of hearing)   . Hyperkalemia   . Hyperphosphatemia   . Hypertension   . Hypertensive kidney disease   . Hypothyroidism   . Nail-patella syndrome   . PONV (postoperative nausea and vomiting)   . Proteinuria   . Shortness of breath dyspnea     Family History:  Family History  Problem Relation Age of Onset  . Hypertension Mother   . Heart disease Father        before age 65  . Hyperlipidemia Father   . Hypertension Father   . Heart attack Father     Social History:  Social History   Tobacco Use  . Smoking status: Never Smoker  . Smokeless tobacco: Never Used  Substance Use Topics  . Alcohol use: No    Alcohol/week: 0.0 oz  . Drug use: No    Review of Systems: A complete ROS was negative except as per HPI.   Physical Exam: Blood pressure (!) 174/77, pulse 86, temperature 98.8 F (37.1 C), temperature source Oral, resp. rate 17, SpO2 94 %. Physical Exam  Constitutional: She is oriented to person, place, and time.  Elderly appearing woman, in no acute distress.   HENT:  Head: Normocephalic and atraumatic.  Eyes: Conjunctivae and EOM are normal.  + Conjunctival pallor.   Cardiovascular: Normal rate and regular rhythm.  Murmur heard. Pulmonary/Chest: Effort normal and breath sounds normal. She has no wheezes. She has no rales.  Abdominal: Soft. There is no tenderness. There is no rebound and no guarding.  Musculoskeletal: She exhibits no edema.  Neurological: She is alert and oriented to person, place,  and time.  Skin: Skin is warm and dry.  Pitting noted of her finger nails.   Psychiatric: Mood and affect normal.     EKG: not obtained.   CXR: personally reviewed my interpretation is cardiomegaly with small left sided effusion  Assessment & Plan by Problem: Principal Problem:   GI bleed Active Problems:   ESRD on dialysis (HCC)   Hypertension   GERD (gastroesophageal reflux disease)   Hypothyroidism   Renal angiomyolipoma  GI Bleed Patient presenting with increased fatigue over past week with noted melena.  She is FOBT  positive in the ED but otherwise hemodynamically stable and in fact is hypertensive.  GI has been consulted and will see in the ED.  She has history of GERD but denies any known ulcer history.  She is not anticoagulated and only takes Aspirin for primary prevention.  She avoids NSAIDs and EtOH.  She reports normal colonoscopy about 10 years ago but recent CT of her abdomen was notable for diverticula.   - Change PPI infusion to BID dosing - Will stop IV fluids as her hemodynamics are stable and she is due for HD today - Appreciate GI recommendations, plan for EGD in the AM - Maintain IV access - Monitor CBC, transfuse as needed for Hbg less than 7 - Check iron, ferritin - Hold aspirin  ESRD on HD She is due for HD today but there is no evidence for urgent HD at this time - Consult nephrology.  Appreciate assistance with HD during hospitalization - Continue Phoslo  HTN Hypertensive in the ED.  Not on medications and suspect will improve with HD. - Monitor  Hypothyroidism - Continue Levothyroxine  Right upper pole renal angiomyolipoma  Seen on recent CT and which had increased in size over the last 2.5 years. It was mentioned an MRI may be helpful to further assess and confirm benign features which can be followed up as an outpatient.     FEN Fluids: Stop IV fluids Electrolytes: Control with HD Nutrition: Renal   DVT PPx: SCD  CODE: DNR  Dispo:  Admit patient to Inpatient with expected length of stay greater than 2 midnights.  SignedJule Ser, DO 04/13/2018, 12:35 PM  Pager: [phone]

## 2018-04-13 NOTE — ED Notes (Signed)
MD at bedside. 

## 2018-04-13 NOTE — Progress Notes (Signed)
Consult received by nephrology. 84 year old ESRD patient (MWF [facility]) admitted with GIB.  Hgb had been stable in the mid 10s until 5/29 when it dropped to 7.5 Last total Fe was 176 5/22  Mircera dose had been 75 q 4 weeks - last 5/29.  Full HD orders to be written for tomorrow hold heparin - no urgent need for HD this evening- will do full consult in the am.  Amalia Hailey, PA-C

## 2018-04-14 ENCOUNTER — Encounter (HOSPITAL_COMMUNITY): Payer: Self-pay | Admitting: Gastroenterology

## 2018-04-14 ENCOUNTER — Encounter (HOSPITAL_COMMUNITY)
Admission: EM | Disposition: A | Discharge status: Home / Self Care | Payer: Self-pay | Source: Home / Self Care | Attending: Internal Medicine

## 2018-04-14 ENCOUNTER — Inpatient Hospital Stay (HOSPITAL_COMMUNITY): Payer: PPO | Admitting: Certified Registered Nurse Anesthetist

## 2018-04-14 DIAGNOSIS — N186 End stage renal disease: Secondary | ICD-10-CM

## 2018-04-14 DIAGNOSIS — Z7982 Long term (current) use of aspirin: Secondary | ICD-10-CM

## 2018-04-14 DIAGNOSIS — R011 Cardiac murmur, unspecified: Secondary | ICD-10-CM

## 2018-04-14 DIAGNOSIS — E039 Hypothyroidism, unspecified: Secondary | ICD-10-CM

## 2018-04-14 DIAGNOSIS — R195 Other fecal abnormalities: Secondary | ICD-10-CM

## 2018-04-14 DIAGNOSIS — Z79890 Hormone replacement therapy (postmenopausal): Secondary | ICD-10-CM

## 2018-04-14 DIAGNOSIS — Q872 Congenital malformation syndromes predominantly involving limbs: Secondary | ICD-10-CM

## 2018-04-14 DIAGNOSIS — R531 Weakness: Secondary | ICD-10-CM

## 2018-04-14 DIAGNOSIS — I120 Hypertensive chronic kidney disease with stage 5 chronic kidney disease or end stage renal disease: Secondary | ICD-10-CM

## 2018-04-14 DIAGNOSIS — R110 Nausea: Secondary | ICD-10-CM

## 2018-04-14 DIAGNOSIS — D649 Anemia, unspecified: Secondary | ICD-10-CM

## 2018-04-14 DIAGNOSIS — Z79899 Other long term (current) drug therapy: Secondary | ICD-10-CM

## 2018-04-14 DIAGNOSIS — K219 Gastro-esophageal reflux disease without esophagitis: Secondary | ICD-10-CM

## 2018-04-14 DIAGNOSIS — Z992 Dependence on renal dialysis: Secondary | ICD-10-CM

## 2018-04-14 DIAGNOSIS — D1771 Benign lipomatous neoplasm of kidney: Secondary | ICD-10-CM

## 2018-04-14 HISTORY — PX: GIVENS CAPSULE STUDY: SHX5432

## 2018-04-14 HISTORY — PX: ESOPHAGOGASTRODUODENOSCOPY (EGD) WITH PROPOFOL: SHX5813

## 2018-04-14 LAB — RENAL FUNCTION PANEL
ALBUMIN: 3 g/dL — AB (ref 3.5–5.0)
ANION GAP: 13 (ref 5–15)
BUN: 62 mg/dL — ABNORMAL HIGH (ref 6–20)
CO2: 27 mmol/L (ref 22–32)
Calcium: 9 mg/dL (ref 8.9–10.3)
Chloride: 100 mmol/L — ABNORMAL LOW (ref 101–111)
Creatinine, Ser: 9.52 mg/dL — ABNORMAL HIGH (ref 0.44–1.00)
GFR calc Af Amer: 4 mL/min — ABNORMAL LOW (ref >60)
GFR, EST NON AFRICAN AMERICAN: 3 mL/min — AB (ref >60)
Glucose, Bld: 110 mg/dL — ABNORMAL HIGH (ref 65–99)
PHOSPHORUS: 3.5 mg/dL (ref 2.5–4.6)
POTASSIUM: 4.6 mmol/L (ref 3.5–5.1)
Sodium: 140 mmol/L (ref 135–145)

## 2018-04-14 LAB — CBC
HEMATOCRIT: 24.8 % — AB (ref 36.0–46.0)
HEMOGLOBIN: 7.9 g/dL — AB (ref 12.0–15.0)
MCH: 28.8 pg (ref 26.0–34.0)
MCHC: 31.9 g/dL (ref 30.0–36.0)
MCV: 90.5 fL (ref 78.0–100.0)
Platelets: 179 10*3/uL (ref 150–400)
RBC: 2.74 MIL/uL — AB (ref 3.87–5.11)
RDW: 14.1 % (ref 11.5–15.5)
WBC: 7.6 10*3/uL (ref 4.0–10.5)

## 2018-04-14 LAB — MRSA PCR SCREENING: MRSA BY PCR: NEGATIVE

## 2018-04-14 SURGERY — ESOPHAGOGASTRODUODENOSCOPY (EGD) WITH PROPOFOL
Anesthesia: Monitor Anesthesia Care

## 2018-04-14 SURGERY — IMAGING PROCEDURE, GI TRACT, INTRALUMINAL, VIA CAPSULE
Anesthesia: LOCAL

## 2018-04-14 MED ORDER — PROPOFOL 500 MG/50ML IV EMUL
INTRAVENOUS | Status: DC | PRN
  Administered 2018-04-14: 80 ug/kg/min via INTRAVENOUS

## 2018-04-14 MED ORDER — SODIUM CHLORIDE 0.9 % IV SOLN: 100.0000 mL | INTRAVENOUS | Status: DC | PRN

## 2018-04-14 MED ORDER — PENTAFLUOROPROP-TETRAFLUOROETH EX AERO: 1.0000 "application " | INHALATION_SPRAY | CUTANEOUS | Status: DC | PRN

## 2018-04-14 MED ORDER — HYDRALAZINE HCL 20 MG/ML IJ SOLN
10.0000 mg | Freq: Once | INTRAMUSCULAR | Status: AC
  Administered 2018-04-14: 10 mg via INTRAVENOUS
  Filled 2018-04-14: qty 1

## 2018-04-14 MED ORDER — FENTANYL CITRATE (PF) 100 MCG/2ML IJ SOLN: 25.0000 ug | INTRAMUSCULAR | Status: DC | PRN

## 2018-04-14 MED ORDER — HEPARIN SODIUM (PORCINE) 1000 UNIT/ML DIALYSIS: 1000.0000 [IU] | INTRAMUSCULAR | Status: DC | PRN

## 2018-04-14 MED ORDER — ONDANSETRON HCL 4 MG/2ML IJ SOLN
4.0000 mg | Freq: Once | INTRAMUSCULAR | Status: AC
  Administered 2018-04-14: 4 mg via INTRAVENOUS
  Filled 2018-04-14: qty 2

## 2018-04-14 MED ORDER — LIDOCAINE HCL (PF) 1 % IJ SOLN
5.0000 mL | INTRAMUSCULAR | Status: DC | PRN
  Filled 2018-04-14: qty 5

## 2018-04-14 MED ORDER — HYDRALAZINE HCL 20 MG/ML IJ SOLN
5.0000 mg | Freq: Four times a day (QID) | INTRAMUSCULAR | Status: DC | PRN
  Filled 2018-04-14: qty 1

## 2018-04-14 MED ORDER — PROPOFOL 10 MG/ML IV BOLUS
INTRAVENOUS | Status: DC | PRN
  Administered 2018-04-14 (×2): 20 mg via INTRAVENOUS

## 2018-04-14 MED ORDER — ALTEPLASE 2 MG IJ SOLR: 2.0000 mg | Freq: Once | INTRAMUSCULAR | Status: DC | PRN

## 2018-04-14 MED ORDER — ONDANSETRON HCL 4 MG/2ML IJ SOLN: 4.0000 mg | Freq: Once | INTRAMUSCULAR | Status: DC | PRN

## 2018-04-14 MED ORDER — SODIUM CHLORIDE 0.9 % IV SOLN: INTRAVENOUS | Status: DC

## 2018-04-14 MED ORDER — LIDOCAINE-PRILOCAINE 2.5-2.5 % EX CREA: 1.0000 "application " | TOPICAL_CREAM | CUTANEOUS | Status: DC | PRN

## 2018-04-14 MED ORDER — HYDRALAZINE HCL 20 MG/ML IJ SOLN
5.0000 mg | Freq: Four times a day (QID) | INTRAMUSCULAR | Status: DC | PRN
  Administered 2018-04-14: 5 mg via INTRAVENOUS
  Filled 2018-04-14: qty 1

## 2018-04-14 SURGICAL SUPPLY — 14 items

## 2018-04-14 NOTE — Discharge Instructions (Signed)
Esophagogastroduodenoscopy, Care After °Refer to this sheet in the next few weeks. These instructions provide you with information about caring for yourself after your procedure. Your health care provider may also give you more specific instructions. Your treatment has been planned according to current medical practices, but problems sometimes occur. Call your health care provider if you have any problems or questions after your procedure. °What can I expect after the procedure? °After the procedure, it is common to have: °· A sore throat. °· Nausea. °· Bloating. °· Dizziness. °· Fatigue. ° °Follow these instructions at home: °· Do not eat or drink anything until the numbing medicine (local anesthetic) has worn off and your gag reflex has returned. You will know that the local anesthetic has worn off when you can swallow comfortably. °· Do not drive for 24 hours if you received a medicine to help you relax (sedative). °· If your health care provider took a tissue sample for testing during the procedure, make sure to get your test results. This is your responsibility. Ask your health care provider or the department performing the test when your results will be ready. °· Keep all follow-up visits as told by your health care provider. This is important. °Contact a health care provider if: °· You cannot stop coughing. °· You are not urinating. °· You are urinating less than usual. °Get help right away if: °· You have trouble swallowing. °· You cannot eat or drink. °· You have throat or chest pain that gets worse. °· You are dizzy or light-headed. °· You faint. °· You have nausea or vomiting. °· You have chills. °· You have a fever. °· You have severe abdominal pain. °· You have black, tarry, or bloody stools. °This information is not intended to replace advice given to you by your health care provider. Make sure you discuss any questions you have with your health care provider. °Document Released: 10/17/2012 Document  Revised: 04/07/2016 Document Reviewed: 09/24/2015 °Elsevier Interactive Patient Education © 2018 [employer] ° °

## 2018-04-14 NOTE — Anesthesia Preprocedure Evaluation (Signed)
Anesthesia Evaluation  Patient identified by MRN, date of birth, ID band Patient awake    Reviewed: Allergy & Precautions, NPO status , Patient's Chart, lab work & pertinent test results  Airway Mallampati: III  TM Distance: >3 FB Neck ROM: Limited    Dental  (+) Teeth Intact, Dental Advisory Given   Pulmonary    breath sounds clear to auscultation       Cardiovascular hypertension,  Rhythm:Regular Rate:Tachycardia     Neuro/Psych    GI/Hepatic   Endo/Other    Renal/GU      Musculoskeletal   Abdominal   Peds  Hematology   Anesthesia Other Findings   Reproductive/Obstetrics                             Anesthesia Physical Anesthesia Plan  ASA: III  Anesthesia Plan:    Post-op Pain Management:    Induction: Intravenous  PONV Risk Score and Plan:   Airway Management Planned: Natural Airway and Nasal Cannula  Additional Equipment:   Intra-op Plan:   Post-operative Plan:   Informed Consent: I have reviewed the patients History and Physical, chart, labs and discussed the procedure including the risks, benefits and alternatives for the proposed anesthesia with the patient or authorized representative who has indicated his/her understanding and acceptance.   Dental advisory given  Plan Discussed with: CRNA and Anesthesiologist  Anesthesia Plan Comments:         Anesthesia Quick Evaluation

## 2018-04-14 NOTE — Progress Notes (Signed)
   Subjective:   Ms Alers was seen laying in her bed this mornings. She states she has been feeling nauseous and has experienced some dry heaving, but no vomiting. She also continue to endorse generalized weakness. She denies any light headedness and state she feels other wise okay and is looking forward to getting the EGD over with and hoping to feel better soon.   Objective:  Vital signs in last 24 hours: Vitals:   04/13/18 2000 04/13/18 2036 04/14/18 0321 04/14/18 0543  BP: (!) 187/80 (!) 185/81 (!) 197/82 (!) 188/85  Pulse: (!) 102 99 (!) 103 (!) 105  Resp: (!) $RemoveB'23 18  18  'abptRwno$ Temp:  99.3 F (37.4 C)  98.7 F (37.1 C)  TempSrc:  Oral  Oral  SpO2: 94% 95%  98%   Physical Exam  Constitutional: She appears well-developed and well-nourished.  Mildly distress with nausea  HENT:  Head: Normocephalic and atraumatic.  Eyes:  Conjunctival palor  Cardiovascular: Normal rate, regular rhythm and intact distal pulses.  Systolic Murmur  Pulmonary/Chest: Effort normal and breath sounds normal. No respiratory distress.  Abdominal: Soft. Bowel sounds are normal. She exhibits no distension. There is no tenderness.  Musculoskeletal: She exhibits no edema or deformity.  Neurological: She is alert.  Skin: Skin is warm and dry.   Assessment/Plan: 84 y.o. woman with PMHx of ESRD 2/2 HTN (on HD MWF), HTN, GERD, hypothyroidism, nail-patella syndrome, heart murmur who presented at the request of her outpatient HD center for concern of blood loss anemia.  GI Bleed: Patient presented with 1 week of fatigue and recent melanotic stools. FOBT + in ED. History of GERD, but denied ulcer history. Not anticoagulation and only takes Aspirin for primary prevention. She denied NSAIDs or EtOH use. Last Coloscopy ~10 years prior and reportedly normal. Recent CT Abd noted diverticula. > CBC stable at 7.9 > Fe low at 22, Ferritin 378 > EGD today (6/1) - GI following, Appreciate their reccomendations   - Protonix $RemoveBe'40mg'IFMTDIaEL$   BID (avoiding IV in setting of ESRD) - Monitor CBC, transfuse as needed for Hbg less than 7 - Plan to D/C ASA as it is no longer recommended for primary prevention  ESRD on HD: On HD MWF. - Plan for HD today (6/1) following EGD - Nephrology following - Continue phoslo  HTN: Hypertensive here.  Not on and medications a home. Will reevaluate BP following HD and provide PRNs until then. - Hydralazine $RemoveBefor'5mg'HEQciEwJqYuT$  PRN (Sys > 185) - Hydralazine $RemoveBefor'10mg'DwdNmnEpTsxo$ , Once prior to EGD  Hypothyroidism - Levothyroxine 175mcg Daily  Right upper pole renal angiomyolipoma  > On recent CT: Increased n size over the last 2.5 years. Noted that MRI may be helpful to further assess and confirm benign features. - followed up as an outpatient  Dispo: Anticipated discharge in approximately 1-2 day(s).   Neva Seat, MD 04/14/2018, 8:54 AM Pager: [phone]

## 2018-04-14 NOTE — Procedures (Signed)
   I was present at this dialysis session, have reviewed the session itself and made  appropriate changes Kelly Splinter MD [place] pager [phone]   04/14/2018, 5:39 PM

## 2018-04-14 NOTE — Interval H&P Note (Signed)
History and Physical Interval Note:  04/14/2018 10:52 AM  Alexis Washington  has presented today for surgery, with the diagnosis of anemia, melena  The various methods of treatment have been discussed with the patient and family. After consideration of risks, benefits and other options for treatment, the patient has consented to  Procedure(s): ESOPHAGOGASTRODUODENOSCOPY (EGD) WITH PROPOFOL (N/A) as a surgical intervention .  The patient's history has been reviewed, patient examined, no change in status, stable for surgery.  I have reviewed the patient's chart and labs.  Questions were answered to the patient's satisfaction.     Cleotis Nipper

## 2018-04-14 NOTE — Anesthesia Postprocedure Evaluation (Signed)
Anesthesia Post Note  Patient: Henli Hey  Procedure(s) Performed: ESOPHAGOGASTRODUODENOSCOPY (EGD) WITH PROPOFOL (N/A ) GIVENS CAPSULE STUDY (N/A )     Patient location during evaluation: Endoscopy Anesthesia Type: MAC Level of consciousness: awake and alert Pain management: pain level controlled Vital Signs Assessment: post-procedure vital signs reviewed and stable Respiratory status: spontaneous breathing, nonlabored ventilation, respiratory function stable and patient connected to nasal cannula oxygen Cardiovascular status: stable and blood pressure returned to baseline Postop Assessment: no apparent nausea or vomiting Anesthetic complications: no    Last Vitals:  Vitals:   04/14/18 1730 04/14/18 1744  BP: (!) 181/77 (!) 148/65  Pulse: 100 (!) 110  Resp: 16 16  Temp:  37.1 C  SpO2:  100%    Last Pain:  Vitals:   04/14/18 1744  TempSrc: Oral  PainSc: 0-No pain                 Damilola Flamm COKER

## 2018-04-14 NOTE — Consult Note (Signed)
Renal Service Consult Note Alexis Washington 04/14/2018 Sol Blazing Requesting Physician:  Dr Daryll Drown  Reason for Consult:  ESRD patient with new anemia/ drop in Hb  HPI: The patient is a 84 y.o. 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 Usual Hb was 10 range and new Hb was down to 7.5.  Pt admitted with Hb 7.9, guiac stool was positive.  Pt went for EGD today which was negative and plan is for small bowel capsule endo started today.  Asked to see for esrd.    Patient denies any bloody stool or melena.  No abd pain or wt loss.  Thinks she has gained weight and asked for her dry wt to be ^'d recently and it was. No orthopnea or doe no cp.     last admit - dec 2018 here for HCAP vs pulm edema vs viral pneumonitis.  PCR was + for rhinovirus and abx were dc'd, also had pulm edema by CXR.  Pt had dialysis x3 while admitted with total 6L removed but dc weights were down only from 54 > 52kg.   52kg was her dry wt at that time.     home meds:  - ppi/ ecasa/ phoslo ac/ colace/ flonase/ synthroid/ sl ntg prn/ raloxifene 60 qd  - vitamins/ supplements/ prn's   ROS  denies CP  no joint pain   no HA  no blurry vision  no rash  no diarrhea  no nausea/ vomiting  no dysuria  no difficulty voiding  no change in urine color    Past Medical History  Past Medical History:  Diagnosis Date  . Anemia in chronic renal disease    ESRD- Sees Dr Justin Mend  . Arthritis   . Benign lipomatous neoplasm of kidney   . Complication of anesthesia 1998   "blood pressure dropped low during surgery and after surgery it went up really high."   . Diverticulitis   . ESRD (end stage renal disease) on dialysis [facility])    "MWF; [place]" (11/08/2017)  . GERD (gastroesophageal reflux disease)   . Heart murmur    PCP- said it is nothing to be concerned   . HOH (hard of hearing)   . Hyperkalemia   . Hyperphosphatemia   . Hypertension   . Hypertensive kidney disease   . Hypothyroidism   . Nail-patella syndrome   . PONV (postoperative nausea and vomiting)   . Proteinuria   . Shortness of breath dyspnea    Past Surgical History  Past Surgical History:  Procedure Laterality Date  . ABDOMINAL HYSTERECTOMY    . APPENDECTOMY    . AV FISTULA PLACEMENT Right 08/10/2015   Procedure: Right Arm Arteriovenous Brachio-cephalic Fistula ;  Surgeon: Conrad Nordheim, MD;  Location: [place];  Service: Vascular;  Laterality: Right;  . BASCILIC VEIN TRANSPOSITION Right 12/15/2015   Procedure: SECOND STAGE Right Arm BRACHIAL VEIN TRANSPOSITION;  Surgeon: Conrad Tulare, MD;  Location: [place];  Service: Vascular;  Laterality: Right;  . CATARACT EXTRACTION W/ INTRAOCULAR LENS  IMPLANT, BILATERAL    . CHOLECYSTECTOMY  1998  . COLONOSCOPY    . FISTULA SUPERFICIALIZATION Right 08/10/2015   Procedure: Right First Stage Brachial Transposition;  Surgeon: Conrad Hawk Point, MD;  Location: [place];  Service: Vascular;  Laterality: Right;  . INSERTION OF DIALYSIS CATHETER N/A 08/10/2015  Procedure: INSERTION OF Right Internal Jugular DIALYSIS CATHETER;  Surgeon: Conrad East Quincy, MD;  Location: [place];  Service: Vascular;  Laterality: N/A;  . IR THORACENTESIS ASP PLEURAL SPACE W/IMG GUIDE  05/11/2017  . TONSILLECTOMY     Family History  Family History  Problem Relation Age of Onset  . Hypertension Mother   . Heart disease Father        before age 59  . Hyperlipidemia Father   . Hypertension Father   . Heart attack Father    Social History  reports that she has never smoked. She has never used smokeless tobacco. She reports that she does not drink alcohol or use drugs. Allergies  Allergies  Allergen Reactions  . Codeine Nausea And Vomiting  . Novocain [Procaine] Palpitations   Home medications Prior to Admission medications   Medication Sig Start Date End Date Taking?  Authorizing Provider  acetaminophen (TYLENOL) 500 MG tablet Take 500 mg by mouth every 8 (eight) hours as needed for mild pain.   Yes [provider]  aspirin EC 81 MG tablet Take 81 mg by mouth daily.   Yes [provider]  calcium acetate (PHOSLO) 667 MG capsule Take 1,334 mg by mouth 3 (three) times daily.  04/21/17  Yes [provider]  Chlorpheniramine-Acetaminophen (CORICIDIN HBP COLD/FLU PO) Take 1 tablet by mouth as needed (cold symptoms).   Yes [provider]  docusate sodium (COLACE) 100 MG capsule Take 1 capsule (100 mg total) by mouth 2 (two) times daily as needed for mild constipation. 05/12/17  Yes Amin, Ankit Chirag, MD  fluticasone (FLONASE) 50 MCG/ACT nasal spray Place 1 spray into both nostrils as needed for allergies or rhinitis.   Yes [provider]  levothyroxine (SYNTHROID, LEVOTHROID) 137 MCG tablet Take 137 mcg by mouth daily. 08/28/17  Yes [provider]  Lidocaine-Prilocaine, Bulk, 2.5-2.5 % CREA Apply 1 application topically as needed (before dialysis).   Yes [provider]  NITROSTAT 0.4 MG SL tablet Place 0.4 mg under the tongue every 5 (five) minutes as needed for chest pain.  10/23/15  Yes [provider]  omeprazole (PRILOSEC) 40 MG capsule Take 40 mg by mouth daily.   Yes [provider]  OVER THE COUNTER MEDICATION Place 1 drop into both eyes daily as needed (allergy symptoms). Allergy Relief eye drops   Yes [provider]  polyethylene glycol (MIRALAX / GLYCOLAX) packet Take 17 g by mouth daily as needed. Patient taking differently: Take 17 g by mouth daily as needed for mild constipation.  05/12/17  Yes Amin, Jeanella Flattery, MD  Probiotic Product (DIGESTIVE ADVANTAGE) CAPS Take 1 capsule by mouth daily.   Yes [provider]  Probiotic Product (PROBIOTIC-10 PO) Take 1 capsule by mouth daily.   Yes [provider]  raloxifene (EVISTA) 60 MG tablet Take 60 mg by  mouth daily. 04/24/17  Yes [provider]   Liver Function Tests Recent Labs  Lab 04/13/18 0821 04/14/18 0731  AST 21  --   ALT 16  --   ALKPHOS 62  --   BILITOT 0.3  --   PROT 5.6*  --   ALBUMIN 3.2* 3.0*   No results for input(s): LIPASE, AMYLASE in the last 168 hours. CBC Recent Labs  Lab 04/13/18 0821 04/14/18 0730  WBC 3.8* 7.6  HGB 7.9* 7.9*  HCT 25.3* 24.8*  MCV 92.7 90.5  PLT 151 284   Basic Metabolic Panel Recent Labs  Lab 04/13/18 0821  04/14/18 0731  NA 136 140  K 3.9 4.6  CL 94* 100*  CO2 28 27  GLUCOSE 111* 110*  BUN 48* 62*  CREATININE 8.10* 9.52*  CALCIUM 8.6* 9.0  PHOS  --  3.5   Iron/TIBC/Ferritin/ %Sat    Component Value Date/Time   IRON 22 (L) 04/13/2018 1251   TIBC NOT CALCULATED 04/13/2018 1251   FERRITIN 378 (H) 04/13/2018 1251   IRONPCTSAT NOT CALCULATED 04/13/2018 1251    Vitals:   04/14/18 1524 04/14/18 1530 04/14/18 1553 04/14/18 1600  BP: 118/79 (!) 153/76 (!) 171/70 (!) 176/65  Pulse: 63 (!) 103 99 100  Resp:  (!) 24 (!) 21 (!) 21  Temp:      TempSrc:      SpO2:      Weight:       Exam Gen alert on hd  No rash, cyanosis or gangrene Sclera anicteric, throat clear  No jvd or bruits Chest occ rales L base, R clear RRR no MRG Abd soft ntnd no mass or ascites +bs GU defer MS no joint effusions or deformity Ext doughy dependent mild thigh edema, no wounds or ulcers Neuro is alert, Ox 3 , nf  right arm avf +bruit     CXR 5/31 - no edema, small L effusion  Dialysis: mwf   4h  Dry wt - pending   2/2.5 heparin 1000 AVF R arm  - meds pending     Impression: 1  GI bleed - sp egd today, for capsule endo next per gi 2  esrd on hd - missed hd yest getting hd today 3  Volume- some edema on exam, but cxr clear, uf 2-3 L on hd today 4  Hypertension - no bp meds at home and bp's are high here, may need pharm Rx 5  ABnormal CT - lesion on one kidney 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

## 2018-04-14 NOTE — Progress Notes (Signed)
  Date: 04/14/2018  Patient name: Alexis Washington  Medical record number: [id number]  Date of birth: 1932/07/10   I have seen and evaluated Alexis Washington and discussed their care with the Residency Team. Briefly, Alexis Washington is an 84yo woman with PMH of ESRD, HTN, GERD, hypothyroidism, nail-patella syndrome who presented with concern for blood loss anemia.  She was noted to have a lower Hgb in HD and was sent to the hospital.  Coinciding symptoms included weakness, melena, diarrhea.  She does take an aspirin.  She has had ongoing abdominal discomfort with recent CT abdomen  Which did show diverticula (not in our system).  She has no chest pain, SOB, fever, chills, syncope.  FOBT was positive.  Her Hgb was 7.9.  GI has been consulted and will plan for EGD.   Fam Hx, and/or Soc Hx : FH of HTN, HD.  SHe does not smoke.   Vitals:   04/14/18 1125 04/14/18 1129  BP:  (!) 133/57  Pulse: (!) 102 (!) 101  Resp: (!) 23 (!) 24  Temp:    SpO2: 93% 93%   General: Thin woman, lying in bed, NAD Eyes: Mild conjunctival pallor, no scleral icterus HENT: NCAT, MMM CV: RR, NR< + murmur Pulm: CTAB, no wheezing Abd: Soft, NT, ND, +BS Ext: + graft in RUE with thrill and bruit.  She has contracted arms and changes to the nails of her hands related to her syndrome Psych: Pleasant mood, no disorientation noted.     Assessment and Plan: I have seen and evaluated the patient as outlined above. I agree with the formulated Assessment and Plan as detailed in the residents' note, with the following changes:   1. Presumed upper GI bleed - PPI BID IV - GI consultation, pending EGD - Holding IVF given need for HD in the near future - Trend CBC - Iron, ferritin levels - Hold aspirin, consider d/c on discharge.   2. ESRD on HD - Nephrology consulted for HD while here  Other issues per resident note.   Sid Falcon, MD 6/1/201912:00 PM

## 2018-04-14 NOTE — Progress Notes (Signed)
Patient's EGD (see dictated report) was negative for any source of heme positive stool or recent drop in hemoglobin.  The patient indicates that she has had several colonoscopies in the past by Dr. Octavia Bruckner Misenheimer in [place], most recently probably about 10 years ago (she states she had "aged out" from need for further exams).  The patient reports that her stools were dark in character, and that she did not see any maroon stools which would suggest a lower GI bleed.  Taking that into account, and given that she has been on aspirin, I wonder about the possibility of small bowel ulcerations.  Accordingly, I have recommended, and the patient is agreeable, that she have a small bowel capsule endoscopy today.  The nature, purpose, and risks of the procedure were reviewed and the patient is agreeable.  Rectal examination at this time shows small chips of formed, brown stool, not melenic in character, certainly not maroon.  Diet orders have been written to accommodate the fact that the patient will be having a capsule study this afternoon.  We can advance her to solid food tomorrow morning.  I have ordered a repeat blood count for the morning as well.  Cleotis Nipper, M.D. Pager [phone] If no answer or after 5 PM call [phone]

## 2018-04-14 NOTE — Transfer of Care (Signed)
Immediate Anesthesia Transfer of Care Note  Patient: Alexis Washington  Procedure(s) Performed: ESOPHAGOGASTRODUODENOSCOPY (EGD) WITH PROPOFOL (N/A )  Patient Location: Endoscopy Unit  Anesthesia Type:MAC  Level of Consciousness: awake, alert  and oriented  Airway & Oxygen Therapy: Patient Spontanous Breathing and Patient connected to nasal cannula oxygen  Post-op Assessment: Report given to RN and Post -op Vital signs reviewed and stable  Post vital signs: Reviewed and stable  Last Vitals:  Vitals Value Taken Time  BP 133/57 04/14/2018 11:29 AM  Temp    Pulse 100 04/14/2018 11:35 AM  Resp 23 04/14/2018 11:35 AM  SpO2 94 % 04/14/2018 11:35 AM  Vitals shown include unvalidated device data.  Last Pain:  Vitals:   04/14/18 1125  TempSrc:   PainSc: 0-No pain      Patients Stated Pain Goal: 3 (73/42/87 6811)  Complications: No apparent anesthesia complications

## 2018-04-14 NOTE — Op Note (Signed)
[facility] Patient Name: Alexis Washington Procedure Date : 04/14/2018 MRN: [id number] Attending MD: Ronald Lobo , MD Date of Birth: 12/01/1931 CSN: [id number] Age: 84 Admit Type: Inpatient Procedure:                Upper GI endoscopy Indications:              Acute post hemorrhagic anemia, Heme positive stool Providers:                Ronald Lobo, MD, Presley Raddle, RN, Nevin Bloodgood, Technician, Clearnce Sorrel, CRNA Referring MD:              Medicines:                Monitored Anesthesia Care Complications:            No immediate complications. Estimated Blood Loss:     Estimated blood loss: none. Procedure:                Pre-Anesthesia Assessment:                           - Prior to the procedure, a History and Physical                            was performed, and patient medications and                            allergies were reviewed. The patient's tolerance of                            previous anesthesia was also reviewed. The risks                            and benefits of the procedure and the sedation                            options and risks were discussed with the patient.                            All questions were answered, and informed consent                            was obtained. Prior Anticoagulants: The patient has                            taken aspirin, last dose was 2 days prior to                            procedure. ASA Grade Assessment: III - A patient                            with severe systemic disease. After reviewing the  risks and benefits, the patient was deemed in                            satisfactory condition to undergo the procedure.                           After obtaining informed consent, the endoscope was                            passed under direct vision. Throughout the                            procedure, the patient's blood pressure, pulse, and                            oxygen saturations were monitored continuously. The                            EG-[id number] ([id number]) scope was introduced through the                            mouth, and advanced to the second part of duodenum.                            The upper GI endoscopy was accomplished without                            difficulty. The patient tolerated the procedure                            well. Scope In: Scope Out: Findings:      A widely patent and mild Schatzki ring was found at the gastroesophageal       junction.      A 2 cm hiatal hernia was present.      The exam of the esophagus was otherwise normal.      The entire examined stomach was normal.      The cardia and gastric fundus were normal on retroflexion.      There is no endoscopic evidence of bleeding, erythema or inflammatory       changes suggestive of gastritis, inflammation, ulceration,       angiodysplasia or erosion in the entire examined stomach.      The examined duodenum was normal.      There is no endoscopic evidence of bleeding, ulceration or       angiodysplasia in the entire examined duodenum. Impression:               - No source of heme positive stool or anemia seen                            on this exam.                           - Widely patent and mild Schatzki ring.                           -  2 cm hiatal hernia.                           - Normal stomach.                           - Normal examined duodenum.                           - No specimens collected. Recommendation:           - To visualize the small bowel, perform video                            capsule endoscopy today. Procedure Code(s):        --- Professional ---                           [phone], Esophagogastroduodenoscopy, flexible,                            transoral; diagnostic, including collection of                            specimen(s) by brushing or washing, when performed                             (separate procedure) Diagnosis Code(s):        --- Professional ---                           K22.2, Esophageal obstruction                           D62, Acute posthemorrhagic anemia                           R19.5, Other fecal abnormalities CPT copyright 2017 American Medical Association. All rights reserved. The codes documented in this report are preliminary and upon coder review may  be revised to meet current compliance requirements. Ronald Lobo, MD 04/14/2018 12:12:09 PM This report has been signed electronically. Number of Addenda: 0

## 2018-04-15 ENCOUNTER — Encounter (HOSPITAL_COMMUNITY): Payer: Self-pay | Admitting: Gastroenterology

## 2018-04-15 LAB — BASIC METABOLIC PANEL
ANION GAP: 9 (ref 5–15)
BUN: 21 mg/dL — ABNORMAL HIGH (ref 6–20)
CALCIUM: 8.1 mg/dL — AB (ref 8.9–10.3)
CO2: 29 mmol/L (ref 22–32)
CREATININE: 5.18 mg/dL — AB (ref 0.44–1.00)
Chloride: 97 mmol/L — ABNORMAL LOW (ref 101–111)
GFR, EST AFRICAN AMERICAN: 8 mL/min — AB (ref >60)
GFR, EST NON AFRICAN AMERICAN: 7 mL/min — AB (ref >60)
Glucose, Bld: 96 mg/dL (ref 65–99)
Potassium: 3.9 mmol/L (ref 3.5–5.1)
Sodium: 135 mmol/L (ref 135–145)

## 2018-04-15 LAB — CBC
HCT: 22.8 % — ABNORMAL LOW (ref 36.0–46.0)
HEMOGLOBIN: 7.1 g/dL — AB (ref 12.0–15.0)
MCH: 28.5 pg (ref 26.0–34.0)
MCHC: 31.1 g/dL (ref 30.0–36.0)
MCV: 91.6 fL (ref 78.0–100.0)
PLATELETS: 144 10*3/uL — AB (ref 150–400)
RBC: 2.49 MIL/uL — ABNORMAL LOW (ref 3.87–5.11)
RDW: 14.6 % (ref 11.5–15.5)
WBC: 4.6 10*3/uL (ref 4.0–10.5)

## 2018-04-15 MED ORDER — CALCIUM ACETATE (PHOS BINDER) 667 MG PO CAPS
1334.0000 mg | ORAL_CAPSULE | Freq: Three times a day (TID) | ORAL | Status: DC
  Administered 2018-04-15 – 2018-04-16 (×2): 1334 mg via ORAL
  Filled 2018-04-15: qty 2

## 2018-04-15 MED ORDER — SODIUM CHLORIDE 0.9 % IV SOLN
125.0000 mg | INTRAVENOUS | Status: DC
  Administered 2018-04-16: 125 mg via INTRAVENOUS
  Filled 2018-04-15 (×2): qty 10

## 2018-04-15 MED ORDER — CHLORHEXIDINE GLUCONATE CLOTH 2 % EX PADS
6.0000 | MEDICATED_PAD | Freq: Every day | CUTANEOUS | Status: DC
  Administered 2018-04-16: 6 via TOPICAL

## 2018-04-15 MED ORDER — RENA-VITE PO TABS
1.0000 | ORAL_TABLET | Freq: Every day | ORAL | Status: DC
  Administered 2018-04-15 – 2018-04-17 (×3): 1 via ORAL
  Filled 2018-04-15 (×3): qty 1

## 2018-04-15 NOTE — Progress Notes (Signed)
  Date: 04/15/2018  Patient name: Alexis Washington  Medical record number: [id number]  Date of birth: 1932-08-13   This patient's plan of care was discussed with the house staff. Please see Dr. Rober Minion note for complete details. I concur with her findings.   Sid Falcon, MD 04/15/2018, 7:50 PM

## 2018-04-15 NOTE — Progress Notes (Signed)
At 1640 pt had a small bowel movement 2 pea sized pieces of stool green and brown in color pt stated that it is hard for her to have a bowel movement when she is not in her own home.

## 2018-04-15 NOTE — Progress Notes (Signed)
   Subjective:   Alexis Washington was seen laying in her bed this morning. Able to tolerate eggs this AM for breakfast without N/V. Passed small amount of stool which was non-bloody.   Objective:  Vital signs in last 24 hours: Vitals:   04/14/18 2100 04/15/18 0607 04/15/18 1337 04/15/18 1405  BP: 138/65 (!) 154/77 (!) 189/79 (!) 159/67  Pulse: 99 (!) 103 (!) 101 97  Resp: $Remo'18 18 14   'IrKgV$ Temp: 99.1 F (37.3 C) 98.9 F (37.2 C) 98.4 F (36.9 C)   TempSrc: Oral Oral Oral   SpO2: 91% 96% 98%   Weight:       Physical Exam  Constitutional: She appears well-developed and well-nourished.  Anxious  HENT:  Head: Normocephalic and atraumatic.  Eyes:  Conjunctival palor  Cardiovascular: Normal rate, regular rhythm and intact distal pulses.  Systolic Murmur  Pulmonary/Chest: Effort normal and breath sounds normal. No respiratory distress.  Abdominal: Soft. Bowel sounds are normal. She exhibits no distension. There is no tenderness.  Musculoskeletal: She exhibits no edema or deformity.  Neurological: She is alert.  Skin: Skin is warm and dry.   Assessment/Plan: 84 y.o. woman with PMHx of ESRD 2/2 HTN (on HD MWF), HTN, GERD, hypothyroidism, nail-patella syndrome who presented from outpatient HD center for acute blood loss anemia. EGD unrevealing, currently undergoing capsule endoscopy.  GI Bleed: EGD unrevealing, currently undergoing capsule endoscopy which will hopefully be available for interpretation tomorrow AM. Did have a mild drop in Hb overnight from 7.9 to 7.1 but continues to deny sources of blood loss. Will not transfuse today and instead check for stability in the morning, transfuse <7.  -Has not required transfusion this admission - GI following, Appreciate their reccomendations   - Protonix $RemoveBe'40mg'rjyYYCPeE$  BID - Monitor CBC, transfuse as needed for Hbg less than 7  ESRD on HD: On HD MWF. - Plan for HD tomorrow.  - Nephrology following, appreciate their assistance. She seems to either have new dry  weight or several Kgs above prior.    HTN: She continues to oscillate between normo and hypertension. Has PRN Hydral ordered but has not received any in >24 hrs. Suspect BP will improve as we approach her dry weight with HD.  - Hydralazine $RemoveBefor'5mg'jkxpmjfgnrkd$  PRN (Sys > 185)  Hypothyroidism - Levothyroxine 141mcg Daily  Dispo: Anticipated discharge in approximately 1-2 day(s).   Lorenzo Pereyra, DO 04/15/2018, 4:57 PM Pager: [phone]

## 2018-04-15 NOTE — Progress Notes (Addendum)
[facility] Progress Note   Dialysis Orders: 4/25 hours 350/800 EDW 53.5 2 K 2.25 Ca profile 4 Var Na linearly AVF heparin 1000 Mircera 75 q 4 weeks - last dose 5/29 calcitriol 0.25-  Hasn't been getting to edw - closer to 54.5 Recent labs:  hgb 10.4 stable in the 10s with drop to 7.5 5/29 iPTH 445 ^  Prior Fe studies: May Fe 176 5/22- unable to calculate April Fe  137 71% sat ferritin 1200  March Fe  112 56% sat Feb Fe 167 85% sat after 10 doses of venofer - last IV Fe given in Feb ferritin 1600 Jan Fe 55 29% sat ferritin 711   Assessment/Plan: 1. GI Bleed - seen by GI - EGD - capsule endoscopy in progress- hgb is stable - holding heparin with HD 2. ESRD - MWF - next HD Monday- try for edw- plan transfuse 1-2 units pending degree of hgb drop on HD Monday  3. Anemia - hgb 7.9 down to 7.1  Fe 22 no tsat ferritin 378 - unable to calculate tsat - Mircera given  75 5/29  -odd variability in studies unless she had some acute blood loss between 5/22 and 5/31 that caused sig drop in Fe; number would suggest she needs more Fe - but given response after 10 doses of IV Fe given in Jan - would only given 3 doses and recheck ferritin with June labs 4. Secondary hyperparathyroidism - recent iPTH elevated ^'d calcitriol - need to notify home unit at d/c/cont calcitriol  5. HTN/volume - BP very high with HD Sat - not clear why. Per RN - not tolerant of higher goal net F 993 cc post wt 58 - bed scale-the most she every UFs per records is 2.2 L. If we cannot get her BP down with volume removal may need to add low dose agent. For now has prn hydralazine ordered. 6. Nutrition - added multivitamin  Myriam Jacobson, PA-C [place] [phone] 04/15/2018,11:52 AM  LOS: 2 days   Pt seen, examined and agree w A/P as above. CXR clear, may need dry weight raised.  Kelly Splinter MD [employer] pager [phone]   04/15/2018, 1:26 PM    Subjective:   Vomited  during HD yesterday.  Had a lot of egg with breakfast this am which she does eat, ate a small amount. No N or V today.  Objective Vitals:   04/14/18 1903 04/14/18 1904 04/14/18 2100 04/15/18 0607  BP:  (!) 143/61 138/65 (!) 154/77  Pulse:  (!) 102 99 (!) 103  Resp:  $Remo'18 18 18  'pASiV$ Temp:  99.1 F (37.3 C) 99.1 F (37.3 C) 98.9 F (37.2 C)  TempSrc:  Oral Oral Oral  SpO2:  93% 91% 96%  Weight: 62 kg (136 lb 11 oz)      Physical Exam General: elderly female NAD sitting in bed Heart: tachy regular Lungs: dim bases poor expansion Abdomen: soft NT Extremities: no LE edema Dialysis Access: right upper AVF + bruit   Additional Objective Labs: Basic Metabolic Panel: Recent Labs  Lab 04/13/18 0821 04/14/18 0731 04/15/18 0652  NA 136 140 135  K 3.9 4.6 3.9  CL 94* 100* 97*  CO2 $Re'28 27 29  'nLM$ GLUCOSE 111* 110* 96  BUN 48* 62* 21*  CREATININE 8.10* 9.52* 5.18*  CALCIUM 8.6* 9.0 8.1*  PHOS  --  3.5  --    Liver Function Tests: Recent Labs  Lab 04/13/18 0821 04/14/18 0731  AST 21  --   ALT 16  --   ALKPHOS 62  --   BILITOT 0.3  --   PROT 5.6*  --   ALBUMIN 3.2* 3.0*   No results for input(s): LIPASE, AMYLASE in the last 168 hours. CBC: Recent Labs  Lab 04/13/18 0821 04/14/18 0730 04/15/18 1017  WBC 3.8* 7.6 4.6  HGB 7.9* 7.9* 7.1*  HCT 25.3* 24.8* 22.8*  MCV 92.7 90.5 91.6  PLT 151 179 144*   Blood Culture No results found for: SDES, SPECREQUEST, CULT, REPTSTATUS  Cardiac Enzymes: No results for input(s): CKTOTAL, CKMB, CKMBINDEX, TROPONINI in the last 168 hours. CBG: No results for input(s): GLUCAP in the last 168 hours. Iron Studies:  Recent Labs    04/13/18 1251  IRON 22*  TIBC NOT CALCULATED  FERRITIN 378*   Lab Results  Component Value Date   INR 1.04 04/13/2018   INR 1.06 05/09/2017   Studies/Results: No results found. Medications: . [START ON 04/16/2018] ferric gluconate (FERRLECIT/NULECIT) IV     . [START ON 04/16/2018] calcitRIOL  0.5 mcg Oral  Q M,W,F-HD  . calcitRIOL  0.5 mcg Oral Once in dialysis  . calcium acetate  1,334 mg Oral TID  . Chlorhexidine Gluconate Cloth  6 each Topical Q0600  . levothyroxine  137 mcg Oral Daily  . multivitamin  1 tablet Oral QHS  . pantoprazole  40 mg Intravenous Q12H  . raloxifene  60 mg Oral Daily  . sodium chloride flush  3 mL Intravenous Q12H

## 2018-04-15 NOTE — Progress Notes (Signed)
Had dialysis last night.    Overnight had mild drop in hemoglobin, from 7.9 to 7.1.  Capsule endoscopy procedure has been completed but as of this time, the file is not available for review on the computer, so we are unable to determine its results.  Yesterday's EGD findings were reviewed with the patient; she does have occasional dysphagia symptoms to correlate with her Schatzki's ring seen on endoscopy, so I advised her that if those symptoms ever become sufficiently bothersome, she could have esophageal dilatation (she sees Dr. Omar Person in [place]).  Plan:   1.  Monitor hemoglobin and stools overnight. 2.  Await results of capsule endoscopy, which will hopefully be ready for interpretation by tomorrow.  Cleotis Nipper, M.D. Pager [phone] If no answer or after 5 PM call [phone]

## 2018-04-16 ENCOUNTER — Encounter (HOSPITAL_COMMUNITY): Payer: Self-pay | Admitting: Gastroenterology

## 2018-04-16 DIAGNOSIS — K922 Gastrointestinal hemorrhage, unspecified: Secondary | ICD-10-CM

## 2018-04-16 DIAGNOSIS — Z9889 Other specified postprocedural states: Secondary | ICD-10-CM

## 2018-04-16 LAB — RENAL FUNCTION PANEL
Albumin: 2.7 g/dL — ABNORMAL LOW (ref 3.5–5.0)
Anion gap: 12 (ref 5–15)
BUN: 40 mg/dL — ABNORMAL HIGH (ref 6–20)
CO2: 28 mmol/L (ref 22–32)
Calcium: 8.2 mg/dL — ABNORMAL LOW (ref 8.9–10.3)
Chloride: 93 mmol/L — ABNORMAL LOW (ref 101–111)
Creatinine, Ser: 7.74 mg/dL — ABNORMAL HIGH (ref 0.44–1.00)
GFR calc Af Amer: 5 mL/min — ABNORMAL LOW (ref >60)
GFR calc non Af Amer: 4 mL/min — ABNORMAL LOW (ref >60)
Glucose, Bld: 93 mg/dL (ref 65–99)
Phosphorus: 3.3 mg/dL (ref 2.5–4.6)
Potassium: 4.1 mmol/L (ref 3.5–5.1)
Sodium: 133 mmol/L — ABNORMAL LOW (ref 135–145)

## 2018-04-16 LAB — CBC
HEMATOCRIT: 22.1 % — AB (ref 36.0–46.0)
Hemoglobin: 6.9 g/dL — CL (ref 12.0–15.0)
MCH: 29 pg (ref 26.0–34.0)
MCHC: 31.2 g/dL (ref 30.0–36.0)
MCV: 92.9 fL (ref 78.0–100.0)
PLATELETS: 162 10*3/uL (ref 150–400)
RBC: 2.38 MIL/uL — ABNORMAL LOW (ref 3.87–5.11)
RDW: 14.5 % (ref 11.5–15.5)
WBC: 4.5 10*3/uL (ref 4.0–10.5)

## 2018-04-16 LAB — IRON AND TIBC
IRON: 29 ug/dL (ref 28–170)
SATURATION RATIOS: 18 % (ref 10.4–31.8)
TIBC: 164 ug/dL — AB (ref 250–450)
UIBC: 135 ug/dL

## 2018-04-16 LAB — PREPARE RBC (CROSSMATCH)

## 2018-04-16 LAB — FERRITIN: Ferritin: 780 ng/mL — ABNORMAL HIGH (ref 11–307)

## 2018-04-16 MED ORDER — ALTEPLASE 2 MG IJ SOLR
2.0000 mg | Freq: Once | INTRAMUSCULAR | Status: DC | PRN
  Filled 2018-04-16: qty 2

## 2018-04-16 MED ORDER — DARBEPOETIN ALFA 200 MCG/0.4ML IJ SOSY
PREFILLED_SYRINGE | INTRAMUSCULAR | Status: AC
  Administered 2018-04-16: 200 ug via INTRAVENOUS
  Filled 2018-04-16: qty 0.4

## 2018-04-16 MED ORDER — SODIUM CHLORIDE 0.9 % IV SOLN: Freq: Once | INTRAVENOUS | Status: DC

## 2018-04-16 MED ORDER — LIDOCAINE-PRILOCAINE 2.5-2.5 % EX CREA
1.0000 "application " | TOPICAL_CREAM | CUTANEOUS | Status: DC | PRN
  Filled 2018-04-16: qty 5

## 2018-04-16 MED ORDER — SODIUM CHLORIDE 0.9 % IV SOLN: 100.0000 mL | INTRAVENOUS | Status: DC | PRN

## 2018-04-16 MED ORDER — PENTAFLUOROPROP-TETRAFLUOROETH EX AERO: 1.0000 "application " | INHALATION_SPRAY | CUTANEOUS | Status: DC | PRN

## 2018-04-16 MED ORDER — HEPARIN SODIUM (PORCINE) 1000 UNIT/ML DIALYSIS
1000.0000 [IU] | INTRAMUSCULAR | Status: DC | PRN
  Filled 2018-04-16: qty 1

## 2018-04-16 MED ORDER — CALCITRIOL 0.5 MCG PO CAPS
ORAL_CAPSULE | ORAL | Status: AC
  Administered 2018-04-16: 0.5 ug via ORAL
  Filled 2018-04-16: qty 1

## 2018-04-16 MED ORDER — LIDOCAINE HCL (PF) 1 % IJ SOLN
5.0000 mL | INTRAMUSCULAR | Status: DC | PRN
  Filled 2018-04-16: qty 5

## 2018-04-16 MED ORDER — DARBEPOETIN ALFA 200 MCG/0.4ML IJ SOSY
200.0000 ug | PREFILLED_SYRINGE | INTRAMUSCULAR | Status: DC
  Administered 2018-04-16: 200 ug via INTRAVENOUS

## 2018-04-16 NOTE — Progress Notes (Signed)
Internal Medicine Attending:   I saw and examined the patient. I reviewed Dr Tally Joe note and I agree with the resident's findings and plan as documented in the resident's note.

## 2018-04-16 NOTE — Progress Notes (Signed)
Patient arrived back from hemodialysis and reports chest soreness ("sore like bruseing from a cold"), only when coughing or moving, paged provider to make aware.

## 2018-04-16 NOTE — H&P (View-Only) (Signed)
[facility] Progress Note  Alexis Washington 84 y.o. 1932-10-28   Subjective: Black stool this morning. S/P dialysis this morning. Feels ok. Husband at bedside.  Objective: Vital signs: Vitals:   04/16/18 1311 04/16/18 1316  BP:    Pulse:    Resp:    Temp:    SpO2: 92% 95%  T 98.6, P 99, BP 115/85, R 18  Physical Exam: Gen: lethargic, elderly, frail, thin, no acute distress  HEENT: anicteric sclera CV: RRR Chest: CTA B Abd: soft, nontender, nondistended, +BS  Lab Results: Recent Labs    04/14/18 0731 04/15/18 0652 04/16/18 0730  NA 140 135 133*  K 4.6 3.9 4.1  CL 100* 97* 93*  CO2 $Re'27 29 28  'mGX$ GLUCOSE 110* 96 93  BUN 62* 21* 40*  CREATININE 9.52* 5.18* 7.74*  CALCIUM 9.0 8.1* 8.2*  PHOS 3.5  --  3.3   Recent Labs    04/14/18 0731 04/16/18 0730  ALBUMIN 3.0* 2.7*   Recent Labs    04/15/18 1017 04/16/18 0549  WBC 4.6 4.5  HGB 7.1* 6.9*  HCT 22.8* 22.1*  MCV 91.6 92.9  PLT 144* 162      Assessment/Plan: Obscure GI bleeding - capsule endoscopy showing fresh blood in proximal duodenum likely due to a bleeding AVM. Small nonbleeding mid-small bowel AVM also noted. Capsule done 2 days ago so may not be actively bleeding now but with Hgb continuing to fall needs a push enteroscopy as next step for further evaluation. Clear liquids this evening. NPO p MN. EGD with Push enteroscopy tomorrow at 1130AM.   [place] C. 04/16/2018, 3:00 PM  Questions please call [phone] ID: Alexis Washington, female   DOB: 26-Dec-1931, 84 y.o.   MRN: [id number]

## 2018-04-16 NOTE — Progress Notes (Signed)
Subjective: Interval History: has complaints weak, feels bad, breathing tight..  Objective: Vital signs in last 24 hours: Temp:  [98.4 F (36.9 C)-99.4 F (37.4 C)] 98.4 F (36.9 C) (06/03 0156) Pulse Rate:  [96-101] 96 (06/03 0634) Resp:  [14-16] 16 (06/03 0633) BP: (159-190)/(67-85) 183/83 (06/03 0634) SpO2:  [92 %-98 %] 92 % (06/03 1537) Weight change:   Intake/Output from previous day: 06/02 0701 - 06/03 0700 In: 283 [P.O.:280; I.V.:3] Out: -  Intake/Output this shift: No intake/output data recorded.  General appearance: alert, cooperative, mildly obese and pale Resp: rales bibasilar Cardio: S1, S2 normal and systolic murmur: systolic ejection 2/6, decrescendo at 2nd left intercostal space GI: mild distension, pos bs. Extremities: edema 1+ and AVF RUA   Lab Results: Recent Labs    04/14/18 0730 04/15/18 1017  WBC 7.6 4.6  HGB 7.9* 7.1*  HCT 24.8* 22.8*  PLT 179 144*   BMET:  Recent Labs    04/14/18 0731 04/15/18 0652  NA 140 135  K 4.6 3.9  CL 100* 97*  CO2 27 29  GLUCOSE 110* 96  BUN 62* 21*  CREATININE 9.52* 5.18*  CALCIUM 9.0 8.1*   No results for input(s): PTH in the last 72 hours. Iron Studies:  Recent Labs    04/13/18 1251  IRON 22*  TIBC NOT CALCULATED  FERRITIN 378*    Studies/Results: No results found.  I have reviewed the patient's current medications.  Assessment/Plan: 1 ESRD vol xs , hd 2 HTN lower vol has effusion 3 Anemia TX, and esa, check Fe 4 HPTH vit D 5 GIB per GI  P HD ,esa, Tx,     LOS: 3 days   Jeneen Rinks Shedrick Sarli 04/16/2018,8:17 AM

## 2018-04-16 NOTE — Procedures (Signed)
I was present at this session.  I have reviewed the session itself and made appropriate changes.  HD via RUA avf. Unfortunately both needles up, so recirc.  Vol xs.   Alexis Washington 6/3/20198:16 AM

## 2018-04-16 NOTE — Progress Notes (Signed)
   Subjective:   Alexis Washington was seen while undergoing dialysis this morning. She states that she continues to feel tired, but no more than yesterday. She denies lightheadedness, chest pain, or dyspnea. She endorses some chest tightness when she coughs. She has no questions this morning.  Objective:  Vital signs in last 24 hours: Vitals:   04/16/18 1130 04/16/18 1200 04/16/18 1211 04/16/18 1258  BP: (!) 152/82 (!) 142/76 (!) 150/72 115/85  Pulse: 93 (!) 102 99 99  Resp:   (!) 21 18  Temp:   97.8 F (36.6 C) 98.6 F (37 C)  TempSrc:   Oral Oral  SpO2:   100% 99%  Weight:   118 lb 2.7 oz (53.6 kg) 121 lb 14.6 oz (55.3 kg)   Physical Exam  Constitutional: She appears well-developed and well-nourished.  HENT:  Head: Normocephalic and atraumatic.  Eyes:  Conjunctival palor  Cardiovascular: Normal rate, regular rhythm and intact distal pulses.  Systolic Murmur  Pulmonary/Chest: Effort normal and breath sounds normal. No respiratory distress.  Abdominal: Soft. Bowel sounds are normal. She exhibits no distension. There is no tenderness.  Musculoskeletal: She exhibits no edema or deformity.  Neurological: She is alert.  Skin: Skin is warm and dry.   Assessment/Plan: 84 y.o. woman with PMHx of ESRD 2/2 HTN (on HD MWF), HTN, GERD, hypothyroidism, nail-patella syndrome who presented from outpatient HD center for acute blood loss anemia.  GI Bleed: EGD unrevealing. Awaiting results of capsule endoscopy. > Hgb 6.9 on AM CBC - GI following, Appreciate their reccomendations   - Monitor CBC, transfuse as needed for Hbg less than 7 - Transfuse 1 Unit with HD - Protonix $RemoveBe'40mg'OKpUUyEfg$  BID  ESRD on HD: On HD MWF. > Nephrology following, appreciate their assistance. - HD today  HTN: She continues to oscillate between normo and hypertension.  - Suspect BP will improve as we approach her dry weight with HD.  - Hydralazine $RemoveBefor'5mg'YspdZqZKFaol$  PRN (Sys > 185)  Hypothyroidism - Levothyroxine 154mcg Daily  Dispo:  Anticipated discharge in approximately 1-2 day(s).   Neva Seat, MD 04/16/2018, 1:07 PM Pager: [phone]

## 2018-04-16 NOTE — Progress Notes (Signed)
[facility] Progress Note  Alexis Washington 84 y.o. 27-Sep-1932   Subjective: Black stool this morning. S/P dialysis this morning. Feels ok. Husband at bedside.  Objective: Vital signs: Vitals:   04/16/18 1311 04/16/18 1316  BP:    Pulse:    Resp:    Temp:    SpO2: 92% 95%  T 98.6, P 99, BP 115/85, R 18  Physical Exam: Gen: lethargic, elderly, frail, thin, no acute distress  HEENT: anicteric sclera CV: RRR Chest: CTA B Abd: soft, nontender, nondistended, +BS  Lab Results: Recent Labs    04/14/18 0731 04/15/18 0652 04/16/18 0730  NA 140 135 133*  K 4.6 3.9 4.1  CL 100* 97* 93*  CO2 $Re'27 29 28  'Eam$ GLUCOSE 110* 96 93  BUN 62* 21* 40*  CREATININE 9.52* 5.18* 7.74*  CALCIUM 9.0 8.1* 8.2*  PHOS 3.5  --  3.3   Recent Labs    04/14/18 0731 04/16/18 0730  ALBUMIN 3.0* 2.7*   Recent Labs    04/15/18 1017 04/16/18 0549  WBC 4.6 4.5  HGB 7.1* 6.9*  HCT 22.8* 22.1*  MCV 91.6 92.9  PLT 144* 162      Assessment/Plan: Obscure GI bleeding - capsule endoscopy showing fresh blood in proximal duodenum likely due to a bleeding AVM. Small nonbleeding mid-small bowel AVM also noted. Capsule done 2 days ago so may not be actively bleeding now but with Hgb continuing to fall needs a push enteroscopy as next step for further evaluation. Clear liquids this evening. NPO p MN. EGD with Push enteroscopy tomorrow at 1130AM.   [place] C. 04/16/2018, 3:00 PM  Questions please call [phone] ID: Alexis Washington, female   DOB: [birth year], 84 y.o.   MRN: [id number]

## 2018-04-17 ENCOUNTER — Inpatient Hospital Stay (HOSPITAL_COMMUNITY): Payer: PPO | Admitting: Anesthesiology

## 2018-04-17 ENCOUNTER — Encounter (HOSPITAL_COMMUNITY)
Admission: EM | Disposition: A | Discharge status: Home / Self Care | Payer: Self-pay | Source: Home / Self Care | Attending: Internal Medicine

## 2018-04-17 DIAGNOSIS — I4891 Unspecified atrial fibrillation: Secondary | ICD-10-CM

## 2018-04-17 DIAGNOSIS — I480 Paroxysmal atrial fibrillation: Secondary | ICD-10-CM

## 2018-04-17 DIAGNOSIS — K5521 Angiodysplasia of colon with hemorrhage: Secondary | ICD-10-CM

## 2018-04-17 HISTORY — PX: HOT HEMOSTASIS: SHX5433

## 2018-04-17 HISTORY — PX: ENTEROSCOPY: SHX5533

## 2018-04-17 LAB — TYPE AND SCREEN
ABO/RH(D): A POS
ANTIBODY SCREEN: NEGATIVE
Unit division: 0

## 2018-04-17 LAB — RENAL FUNCTION PANEL
ANION GAP: 7 (ref 5–15)
Albumin: 2.5 g/dL — ABNORMAL LOW (ref 3.5–5.0)
Albumin: 2.8 g/dL — ABNORMAL LOW (ref 3.5–5.0)
Anion gap: 12 (ref 5–15)
BUN: 20 mg/dL (ref 6–20)
BUN: 29 mg/dL — ABNORMAL HIGH (ref 6–20)
CHLORIDE: 96 mmol/L — AB (ref 101–111)
CO2: 30 mmol/L (ref 22–32)
CO2: 27 mmol/L (ref 22–32)
Calcium: 7.8 mg/dL — ABNORMAL LOW (ref 8.9–10.3)
Calcium: 8.1 mg/dL — ABNORMAL LOW (ref 8.9–10.3)
Chloride: 96 mmol/L — ABNORMAL LOW (ref 101–111)
Creatinine, Ser: 4.87 mg/dL — ABNORMAL HIGH (ref 0.44–1.00)
Creatinine, Ser: 6.24 mg/dL — ABNORMAL HIGH (ref 0.44–1.00)
GFR calc Af Amer: 6 mL/min — ABNORMAL LOW (ref >60)
GFR calc non Af Amer: 7 mL/min — ABNORMAL LOW (ref >60)
GFR calc non Af Amer: 5 mL/min — ABNORMAL LOW (ref >60)
GFR, EST AFRICAN AMERICAN: 9 mL/min — AB (ref >60)
Glucose, Bld: 100 mg/dL — ABNORMAL HIGH (ref 65–99)
Glucose, Bld: 94 mg/dL (ref 65–99)
Phosphorus: 2.4 mg/dL — ABNORMAL LOW (ref 2.5–4.6)
Phosphorus: 3.3 mg/dL (ref 2.5–4.6)
Potassium: 4 mmol/L (ref 3.5–5.1)
Potassium: 4.2 mmol/L (ref 3.5–5.1)
Sodium: 133 mmol/L — ABNORMAL LOW (ref 135–145)
Sodium: 135 mmol/L (ref 135–145)

## 2018-04-17 LAB — CBC
HCT: 26.5 % — ABNORMAL LOW (ref 36.0–46.0)
HCT: 30.4 % — ABNORMAL LOW (ref 36.0–46.0)
HEMOGLOBIN: 8.6 g/dL — AB (ref 12.0–15.0)
Hemoglobin: 9.6 g/dL — ABNORMAL LOW (ref 12.0–15.0)
MCH: 29.3 pg (ref 26.0–34.0)
MCH: 29.4 pg (ref 26.0–34.0)
MCHC: 32.5 g/dL (ref 30.0–36.0)
MCHC: 31.6 g/dL (ref 30.0–36.0)
MCV: 90.1 fL (ref 78.0–100.0)
MCV: 93 fL (ref 78.0–100.0)
Platelets: 140 10*3/uL — ABNORMAL LOW (ref 150–400)
Platelets: 153 10*3/uL (ref 150–400)
RBC: 2.94 MIL/uL — AB (ref 3.87–5.11)
RBC: 3.27 MIL/uL — ABNORMAL LOW (ref 3.87–5.11)
RDW: 14.7 % (ref 11.5–15.5)
RDW: 15.3 % (ref 11.5–15.5)
WBC: 4 10*3/uL (ref 4.0–10.5)
WBC: 5.3 10*3/uL (ref 4.0–10.5)

## 2018-04-17 LAB — BPAM RBC
Blood Product Expiration Date: 201906102359
ISSUE DATE / TIME: 201906030920
Unit Type and Rh: 6200

## 2018-04-17 LAB — MAGNESIUM: Magnesium: 1.9 mg/dL (ref 1.7–2.4)

## 2018-04-17 LAB — TSH: TSH: 21.068 u[IU]/mL — AB (ref 0.350–4.500)

## 2018-04-17 SURGERY — ENTEROSCOPY
Anesthesia: Monitor Anesthesia Care

## 2018-04-17 MED ORDER — CARVEDILOL 6.25 MG PO TABS
6.2500 mg | ORAL_TABLET | Freq: Two times a day (BID) | ORAL | Status: DC
  Administered 2018-04-17 – 2018-04-18 (×2): 6.25 mg via ORAL
  Filled 2018-04-17 (×2): qty 1

## 2018-04-17 MED ORDER — LEVOTHYROXINE SODIUM 75 MCG PO TABS
150.0000 ug | ORAL_TABLET | Freq: Every day | ORAL | Status: DC
  Administered 2018-04-17: 150 ug via ORAL
  Filled 2018-04-17: qty 2

## 2018-04-17 MED ORDER — PHENYLEPHRINE HCL 10 MG/ML IJ SOLN: INTRAVENOUS | Status: DC | PRN

## 2018-04-17 MED ORDER — PROPOFOL 10 MG/ML IV BOLUS
INTRAVENOUS | Status: DC | PRN
  Administered 2018-04-17 (×3): 20 mg via INTRAVENOUS

## 2018-04-17 MED ORDER — HYDRALAZINE HCL 20 MG/ML IJ SOLN
10.0000 mg | Freq: Once | INTRAMUSCULAR | Status: AC
  Administered 2018-04-17: 10 mg via INTRAVENOUS
  Filled 2018-04-17: qty 1

## 2018-04-17 MED ORDER — PROPOFOL 500 MG/50ML IV EMUL
INTRAVENOUS | Status: DC | PRN
  Administered 2018-04-17: 75 ug/kg/min via INTRAVENOUS

## 2018-04-17 MED ORDER — PENTAFLUOROPROP-TETRAFLUOROETH EX AERO: 1.0000 "application " | INHALATION_SPRAY | CUTANEOUS | Status: DC | PRN

## 2018-04-17 MED ORDER — CHLORHEXIDINE GLUCONATE CLOTH 2 % EX PADS
6.0000 | MEDICATED_PAD | Freq: Every day | CUTANEOUS | Status: DC
  Administered 2018-04-17 – 2018-04-18 (×2): 6 via TOPICAL

## 2018-04-17 MED ORDER — LIDOCAINE-PRILOCAINE 2.5-2.5 % EX CREA: 1.0000 "application " | TOPICAL_CREAM | CUTANEOUS | Status: DC | PRN

## 2018-04-17 MED ORDER — SODIUM CHLORIDE 0.9 % IV SOLN: 100.0000 mL | INTRAVENOUS | Status: DC | PRN

## 2018-04-17 MED ORDER — SODIUM CHLORIDE 0.9 % IV SOLN
INTRAVENOUS | Status: DC
  Administered 2018-04-17: 11:00:00 via INTRAVENOUS

## 2018-04-17 MED ORDER — HEPARIN SODIUM (PORCINE) 1000 UNIT/ML DIALYSIS
1000.0000 [IU] | INTRAMUSCULAR | Status: DC | PRN
  Filled 2018-04-17: qty 1

## 2018-04-17 MED ORDER — LIDOCAINE HCL (PF) 1 % IJ SOLN
5.0000 mL | INTRAMUSCULAR | Status: DC | PRN
  Filled 2018-04-17: qty 5

## 2018-04-17 MED ORDER — SODIUM CHLORIDE 0.9 % IV SOLN
125.0000 mg | INTRAVENOUS | Status: DC
  Administered 2018-04-18: 125 mg via INTRAVENOUS
  Filled 2018-04-17 (×2): qty 10

## 2018-04-17 MED ORDER — ALTEPLASE 2 MG IJ SOLR
2.0000 mg | Freq: Once | INTRAMUSCULAR | Status: DC | PRN
  Filled 2018-04-17: qty 2

## 2018-04-17 MED ORDER — PHENYLEPHRINE 40 MCG/ML (10ML) SYRINGE FOR IV PUSH (FOR BLOOD PRESSURE SUPPORT)
PREFILLED_SYRINGE | INTRAVENOUS | Status: DC | PRN
  Administered 2018-04-17: 120 ug via INTRAVENOUS
  Administered 2018-04-17: 80 ug via INTRAVENOUS
  Administered 2018-04-17: 120 ug via INTRAVENOUS

## 2018-04-17 NOTE — Progress Notes (Signed)
Internal Medicine Attending:   I saw and examined the patient. I reviewed the resident's note and I agree with the resident's findings and plan as documented in the resident's note with the following additions. Patient noted to have episodic tachycardia last night, appears to be new onset Afib with RVR, she quickly converted out of it.  TSH checked and elevated. I would hold off increasing her synthroid from 137 to 141mcg as noted by Dr Trilby Drummer, first I want to check a free T4 and inquire about home compliance, she is well above weight based dosing for synthroid and it could be that this has been titrated above what is necessary.   Upper GI bleed due to duodenal AVM>> push endoscopy today.

## 2018-04-17 NOTE — Progress Notes (Signed)
Paged to bedside by RN for evaluation of patient's irregular HR. Upon arrival patient sleeping comfortably in bed in no acute distress. Per discussion with RN patient has not complained of chest pain this evening, but did notice possible palpitations earlier in the evening prior to going to sleep. Review of telemetry showed development of A-Fib around 11:30 pm on 04/16/2018. EKG obtained around 1:30 AM showed A-Fib with RVR.    Since EKG obtained patient has been intermittently tachycardic to 120s-140s for about 1 second, but predominantly HR has been in 80s-100s. Patient's most recent BP 140s/90s.   A/P: Patient with new onset atrial fibrillation, unclear from chart if patient has history of this arrhythmia (not noted this hospitalization prior to this evening). Currently hemodynamically stable and asymptomatic. Review of recent echo shows severely dilated LA, which may be contributing (along with age) to development of A-Fib. Currently rate controlled, but will plan to give metoprolol for sustained HR >140. Will obtain AM labs now, including renal function panel, TSH, and CBC to ensure Hgb increased appropriately after transfusion on 6/3 and not exacerbating current arrhythmia. Consider cardiology consult in AM if not self-converted overnight. Patient will likely need long term anticoagulation for elevated CHADS-VASC (4) but will defer this for now in the setting of acute anemia.

## 2018-04-17 NOTE — Op Note (Signed)
[facility] Patient Name: Alexis Washington Procedure Date : 04/17/2018 MRN: [id number] Attending MD: Lear Ng , MD Date of Birth: 22-Jan-1932 CSN: [id number] Age: 84 Admit Type: Inpatient Procedure:                Small bowel enteroscopy Indications:              Obscure gastrointestinal bleeding Providers:                Lear Ng, MD, Baird Cancer, RN,                            William Dalton, Technician Referring MD:             hospital team Medicines:                Propofol per Anesthesia, Monitored Anesthesia Care Complications:            No immediate complications. Estimated Blood Loss:     Estimated blood loss: none. Procedure:                After obtaining informed consent, the endoscope was                            passed under direct vision. Throughout the                            procedure, the patient's blood pressure, pulse, and                            oxygen saturations were monitored continuously. The                            [id number]-[id number] ([id number]) scope was introduced through                            the mouth and advanced to the proximal jejunum. The                            small bowel enteroscopy was performed with                            difficulty due to significant looping. Successful                            completion of the procedure was aided by                            straightening and shortening the scope to obtain                            bowel loop reduction. The patient tolerated the                            procedure fairly well. Scope In: Scope Out: Findings:      There was no evidence of significant pathology in the proximal jejunum.  Normal mucosa was found in the second portion of the duodenum, in the       third portion of the duodenum and in the fourth portion of the duodenum.      A few angiodysplastic lesions with no bleeding were found in the       duodenal bulb. Fulguration to  ablate the lesion to prevent bleeding by       argon plasma was successful. Estimated blood loss: none.      The stomach was normal.      No blood seen in the areas that were examined (from 0 to 160 cm from the       incisors).      LA Grade B (one or more mucosal breaks greater than 5 mm, not extending       between the tops of two mucosal folds) esophagitis with no bleeding was       found at the gastroesophageal junction.      A low-grade of narrowing Schatzki ring was found at the gastroesophageal       junction. Impression:               - The examined portion of the jejunum was normal.                           - Normal mucosa was found in the second portion of                            the duodenum, in the third portion of the duodenum                            and in the fourth portion of the duodenum.                           - A few non-bleeding angiodysplastic lesions in the                            duodenum. Treated with argon plasma coagulation                            (APC).                           - Normal stomach.                           - LA Grade B esophagitis.                           - Low-grade of narrowing Schatzki ring.                           - No specimens collected. Recommendation:           - Follow clinical course. If significant rebleeding                            occurs, then may need a double balloon enteroscopy.                           -  Use Protonix (pantoprazole) 40 mg PO daily. Procedure Code(s):        --- Professional ---                           [phone], Small intestinal endoscopy, enteroscopy                            beyond second portion of duodenum, not including                            ileum; with control of bleeding (eg, injection,                            bipolar cautery, unipolar cautery, laser, heater                            probe, stapler, plasma coagulator) Diagnosis Code(s):        --- Professional ---                            K92.2, Gastrointestinal hemorrhage, unspecified                           K31.819, Angiodysplasia of stomach and duodenum                            without bleeding                           K22.2, Esophageal obstruction                           K20.9, Esophagitis, unspecified CPT copyright 2017 [employer]. All rights reserved. The codes documented in this report are preliminary and upon coder review may  be revised to meet current compliance requirements. Lear Ng, MD 04/17/2018 12:39:47 PM This report has been signed electronically. Number of Addenda: 0

## 2018-04-17 NOTE — Transfer of Care (Signed)
Immediate Anesthesia Transfer of Care Note  Patient: Alexis Washington  Procedure(s) Performed: ENTEROSCOPY (N/A )  Patient Location: Endoscopy Unit  Anesthesia Type:MAC  Level of Consciousness: drowsy and patient cooperative  Airway & Oxygen Therapy: Patient Spontanous Breathing and Patient connected to nasal cannula oxygen  Post-op Assessment: Report given to RN, Post -op Vital signs reviewed and stable and Patient moving all extremities X 4  Post vital signs: Reviewed and stable  Last Vitals:  Vitals Value Taken Time  BP 100/34 04/17/2018 12:33 PM  Temp    Pulse 96 04/17/2018 12:34 PM  Resp 17 04/17/2018 12:34 PM  SpO2 98 % 04/17/2018 12:34 PM  Vitals shown include unvalidated device data.  Last Pain:  Vitals:   04/17/18 1040  TempSrc: Oral  PainSc: 0-No pain      Patients Stated Pain Goal: 0 (62/44/69 5072)  Complications: No apparent anesthesia complications

## 2018-04-17 NOTE — Progress Notes (Addendum)
[facility] Progress Note   Dialysis Orders: 4/25 hours 350/800 EDW 53.5 2 K 2.25 Ca profile 4 Var Na linearly AVF heparin 1000 Mircera 75 q 4 weeks - last dose 5/29 calcitriol 0.25-  Hasn't been getting to edw - closer to 54.5 Recent labs:  hgb 10.4 stable in the 10s with drop to 7.5 5/29 iPTH 445 ^  Prior Fe studies: May Fe 176 5/22- unable to calculate April Fe  137 71% sat ferritin 1200  March Fe  112 56% sat Feb Fe 167 85% sat after 10 doses of venofer - last IV Fe given in Feb ferritin 1600 Jan Fe 55 29% sat ferritin 711   Assessment/Plan: 1. GI Bleed - seen by GI - EGD - capsule endoscopy showed fresh blood in prox duodenum - likely AVM and small nonbleeding mid small bowel AVM for push endoscopy today 2. ESRD - MWF - next HD Wed  K 4- no heparin 3. Anemia hgb 6.9 > 8.6 after  1 units PRBC 6/3 Fe 29 18% sat ferritin 780 - repelete/continue ESA 4. Secondary hyperparathyroidism - recent iPTH elevated ^'d calcitriol - need to notify home unit at d/c/cont calcitriol  5. HTN/volume -net UF 2.6 L post wt 53.5 - not on BP meds at home - continue to titrate EDW down further- may need to add oral agent Lower vol, lower bp 6. Nutrition - added multivitamin 7. New Hypothyroidism - TSH 21- will need replacement -  8. New onset afib- TSH as above - per primary; not appropriate for anticoagulation at this time    Myriam Jacobson, PA-C [place] [phone] 04/17/2018,9:53 AM  LOS: 4 days  I have seen and examined this patient and agree with the plan of care seen, eval, examined, counseled. Jeneen Rinks Vauda Salvucci 04/17/2018, 10:13 AM   Subjective:  No problems with HD yesterday. Feels much better today  Objective Vitals:   04/16/18 2109 04/17/18 0114 04/17/18 0611 04/17/18 0804  BP: (!) 160/82 (!) 142/77 (!) 180/75 (!) 172/77  Pulse: 79 90 85   Resp: $Remo'16 19 16   'dtxVu$ Temp: 98.9 F (37.2 C)  98.6 F (37 C)   TempSrc:   Oral   SpO2: 92% 97% 95%    Weight:       Physical Exam General: Talkative and alert, NAD Heart: RRR 2/6 murmur  Lungs: grossly clear Abdomen: soft NT Extremities:tr LE edema Dialysis Access:  Right upper AVF + bruit   Additional Objective Labs: Basic Metabolic Panel: Recent Labs  Lab 04/14/18 0731 04/15/18 0652 04/16/18 0730 04/17/18 0239  NA 140 135 133* 133*  K 4.6 3.9 4.1 4.0  CL 100* 97* 93* 96*  CO2 $Re'27 29 28 30  'EZB$ GLUCOSE 110* 96 93 100*  BUN 62* 21* 40* 20  CREATININE 9.52* 5.18* 7.74* 4.87*  CALCIUM 9.0 8.1* 8.2* 7.8*  PHOS 3.5  --  3.3 2.4*   Liver Function Tests: Recent Labs  Lab 04/13/18 0821 04/14/18 0731 04/16/18 0730 04/17/18 0239  AST 21  --   --   --   ALT 16  --   --   --   ALKPHOS 62  --   --   --   BILITOT 0.3  --   --   --   PROT 5.6*  --   --   --   ALBUMIN 3.2* 3.0* 2.7* 2.5*   No results for input(s): LIPASE, AMYLASE in the last 168 hours. CBC: Recent Labs  Lab 04/13/18 0821 04/14/18 0730 04/15/18 1017 04/16/18 0549 04/17/18 0239  WBC 3.8* 7.6 4.6 4.5 4.0  HGB 7.9* 7.9* 7.1* 6.9* 8.6*  HCT 25.3* 24.8* 22.8* 22.1* 26.5*  MCV 92.7 90.5 91.6 92.9 90.1  PLT 151 179 144* 162 140*   Blood Culture No results found for: SDES, SPECREQUEST, CULT, REPTSTATUS  Cardiac Enzymes: No results for input(s): CKTOTAL, CKMB, CKMBINDEX, TROPONINI in the last 168 hours. CBG: No results for input(s): GLUCAP in the last 168 hours. Iron Studies:  Recent Labs    04/16/18 0816  IRON 29  TIBC 164*  FERRITIN 780*   Lab Results  Component Value Date   INR 1.04 04/13/2018   INR 1.06 05/09/2017   Studies/Results: No results found. Medications: . ferric gluconate (FERRLECIT/NULECIT) IV Stopped (04/16/18 1331)   . calcitRIOL  0.5 mcg Oral Q M,W,F-HD  . calcium acetate  1,334 mg Oral TID WC  . Chlorhexidine Gluconate Cloth  6 each Topical Q0600  . darbepoetin (ARANESP) injection - DIALYSIS  200 mcg Intravenous Q Mon-HD  . levothyroxine  150 mcg Oral QAC breakfast  .  multivitamin  1 tablet Oral QHS  . pantoprazole  40 mg Intravenous Q12H  . raloxifene  60 mg Oral Daily  . sodium chloride flush  3 mL Intravenous Q12H

## 2018-04-17 NOTE — Brief Op Note (Signed)
Push enteroscopy to 160 cm from the incisors without any blood products or active bleeding seen. Several small nonbleeding AVMs noted in duodenal bulb that were ablated with argon plasma coagulation. Mild esophagitis and a mildly narrowing esophageal ring were noted. Soft diet ok and when close to discharge change to oral PPI 40 mg QD. If significant rebleeding occurs in the future, then may need a double balloon enteroscopy at a tertiary care center. No additional GI procedures planned at this time. Will sign off. Call if questions.

## 2018-04-17 NOTE — Care Management Important Message (Signed)
Important Message  Patient Details  Name: [facility] MRN: [id number] Date of Birth: August 09, 1932   Medicare Important Message Given:  Yes    [name] Montine Circle 04/17/2018, 3:38 PM

## 2018-04-17 NOTE — Progress Notes (Signed)
Made Dr. Berneice Gandy aware patient had 1 run of 12 beats of wide QRS, and patient heart rate increasing to the 150s but not sustaining. Vital signs stable. Patient denies chest pain at this time. Patient said she had a funny feeling in her chest but it went away. Orders placed. Will continue to monitor.

## 2018-04-17 NOTE — Interval H&P Note (Signed)
History and Physical Interval Note:  04/17/2018 11:39 AM  [name]  has presented today for surgery, with the diagnosis of Gi bleed  The various methods of treatment have been discussed with the patient and family. After consideration of risks, benefits and other options for treatment, the patient has consented to  Procedure(s): ENTEROSCOPY (N/A) as a surgical intervention .  The patient's history has been reviewed, patient examined, no change in status, stable for surgery.  I have reviewed the patient's chart and labs.  Questions were answered to the patient's satisfaction.     [employer] C.

## 2018-04-17 NOTE — Anesthesia Preprocedure Evaluation (Addendum)
Anesthesia Evaluation  Patient identified by MRN, date of birth, ID band Patient awake    Reviewed: Allergy & Precautions, NPO status , Patient's Chart, lab work & pertinent test results  History of Anesthesia Complications (+) PONV and history of anesthetic complications  Airway Mallampati: III  TM Distance: >3 FB Neck ROM: Limited    Dental  (+) Teeth Intact, Dental Advisory Given   Pulmonary    breath sounds clear to auscultation       Cardiovascular hypertension, Pt. on medications  Rhythm:Regular Rate:Tachycardia     Neuro/Psych    GI/Hepatic GERD  Medicated,  Endo/Other  Hypothyroidism   Renal/GU ESRF and DialysisRenal disease     Musculoskeletal  (+) Arthritis ,   Abdominal   Peds  Hematology  (+) anemia ,   Anesthesia Other Findings   Reproductive/Obstetrics                            Anesthesia Physical  Anesthesia Plan  ASA: III  Anesthesia Plan: MAC   Post-op Pain Management:    Induction: Intravenous  PONV Risk Score and Plan:   Airway Management Planned: Natural Airway and Nasal Cannula  Additional Equipment:   Intra-op Plan:   Post-operative Plan:   Informed Consent: I have reviewed the patients History and Physical, chart, labs and discussed the procedure including the risks, benefits and alternatives for the proposed anesthesia with the patient or authorized representative who has indicated his/her understanding and acceptance.   Dental advisory given  Plan Discussed with: CRNA, Anesthesiologist and Surgeon  Anesthesia Plan Comments:        Anesthesia Quick Evaluation

## 2018-04-17 NOTE — Progress Notes (Signed)
   Subjective:   [facility] was seen resting comfortably in her bed this morning. She has an episode of palpitations over night and was noted to be in a/ fib with RVR intermittently. She states she did not experience any chest pain or shortness of breath. She states that she overall feels stronger today following her transfusion yesterday. She is looking forward to getting her procedure done this morning and being able to eat afterwards. She has no further questions this morning.  Objective:  Vital signs in last 24 hours: Vitals:   04/16/18 1316 04/16/18 2109 04/17/18 0114 04/17/18 0611  BP:  (!) 160/82 (!) 142/77 (!) 180/75  Pulse:  79 90 85  Resp:  $Remo'16 19 16  'eKyrz$ Temp:  98.9 F (37.2 C)  98.6 F (37 C)  TempSrc:    Oral  SpO2: 95% 92% 97% 95%  Weight:       Physical Exam  Constitutional: She is oriented to person, place, and time. She appears well-developed and well-nourished.  HENT:  Head: Normocephalic and atraumatic.  Cardiovascular: Normal rate, regular rhythm and intact distal pulses.  Systolic Murmur  Pulmonary/Chest: Effort normal and breath sounds normal. No respiratory distress.  Abdominal: Soft. Bowel sounds are normal. She exhibits no distension. There is no tenderness.  Musculoskeletal: She exhibits no edema.  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  Skin: Skin is warm and dry.   Assessment/Plan: 84 y.o. woman with PMHx of ESRD 2/2 HTN (on HD MWF), HTN, GERD, hypothyroidism, nail-patella syndrome who presented from outpatient HD center for acute blood loss anemia.  GI Bleed: EGD unrevealing. Awaiting results of capsule endoscopy. > Hgb improved to 8.6 s/p 1U pRBC - GI following, Appreciate their reccomendations   - Monitor CBC, transfuse as needed for Hbg less than 7 - Protonix $RemoveBe'40mg'ZemVgtrZA$  BID  New onset A/ Fib with Intermittent RVR: > Patient noted to have intermittent tachycardia with appearance of  A.fib with RVR for about 2 hours overnight. This convered back and forth to normal sinus rhythm spontaneously and has remained normal since. > Patient recently had Carvedilol 12.$RemoveBeforeDEI'5mg'WVyCVbKLtISwjyke$  BID discontinued; this was a part of her hypertension regimen but was likely having a rate control effect as well - Will discuss risks and benefits of Anticoagulation in 84 yo patient with AVMs and CHADS2Vasc of 4 - Patient does follow with a cardiologist and will need to follow up as an outpatient.   ESRD on HD: On HD MWF. > Nephrology following, appreciate their assistance.  HTN: She continues to oscillate between normo and hypertension.  > BP improved briefly following HD, but is elevated again today > Previously on Amlodipine $RemoveBefor'5mg'FOETcUKxFiGy$  Daily and Carvedilol $RemoveBefore'25mg'LjDJoczxikVBK$  BID which was discontinued in the past month or so 2/2 hypotension at dialysis sessions - Hydralazine $RemoveBefor'5mg'rkUkEzfIRCPf$  PRN (Sys > 180) - Hydralazine $RemoveBefor'10mg'elyKSTUtPiTp$ , once - Restart Carvedilol at reduced dose of 6.25 BID  Hypothyroidism: TSH checked in setting of A.Fib overnight: 21.06 - Increase Levothyroxine from 138mcg to 169mcg Daily - Will need to have TSH rechecked as outpatient  Dispo: Anticipated discharge in approximately 1-2 day(s).   Neva Seat, MD 04/17/2018, 7:10 AM Pager: [phone]

## 2018-04-18 ENCOUNTER — Encounter (HOSPITAL_COMMUNITY): Payer: Self-pay | Admitting: Gastroenterology

## 2018-04-18 ENCOUNTER — Other Ambulatory Visit: Payer: Self-pay | Admitting: Internal Medicine

## 2018-04-18 DIAGNOSIS — Z885 Allergy status to narcotic agent status: Secondary | ICD-10-CM

## 2018-04-18 DIAGNOSIS — K31819 Angiodysplasia of stomach and duodenum without bleeding: Secondary | ICD-10-CM

## 2018-04-18 DIAGNOSIS — Z884 Allergy status to anesthetic agent status: Secondary | ICD-10-CM

## 2018-04-18 LAB — RENAL FUNCTION PANEL
ALBUMIN: 2.9 g/dL — AB (ref 3.5–5.0)
Anion gap: 13 (ref 5–15)
BUN: 40 mg/dL — AB (ref 6–20)
CALCIUM: 8 mg/dL — AB (ref 8.9–10.3)
CO2: 26 mmol/L (ref 22–32)
CREATININE: 7.54 mg/dL — AB (ref 0.44–1.00)
Chloride: 94 mmol/L — ABNORMAL LOW (ref 101–111)
GFR calc Af Amer: 5 mL/min — ABNORMAL LOW (ref >60)
GFR calc non Af Amer: 4 mL/min — ABNORMAL LOW (ref >60)
GLUCOSE: 130 mg/dL — AB (ref 65–99)
PHOSPHORUS: 3.6 mg/dL (ref 2.5–4.6)
Potassium: 3.7 mmol/L (ref 3.5–5.1)
Sodium: 133 mmol/L — ABNORMAL LOW (ref 135–145)

## 2018-04-18 LAB — CBC
HCT: 29.2 % — ABNORMAL LOW (ref 36.0–46.0)
Hemoglobin: 9.4 g/dL — ABNORMAL LOW (ref 12.0–15.0)
MCH: 29.4 pg (ref 26.0–34.0)
MCHC: 32.2 g/dL (ref 30.0–36.0)
MCV: 91.3 fL (ref 78.0–100.0)
PLATELETS: 170 10*3/uL (ref 150–400)
RBC: 3.2 MIL/uL — ABNORMAL LOW (ref 3.87–5.11)
RDW: 15.6 % — AB (ref 11.5–15.5)
WBC: 4.3 10*3/uL (ref 4.0–10.5)

## 2018-04-18 LAB — T4, FREE: Free T4: 1.38 ng/dL (ref 0.82–1.77)

## 2018-04-18 MED ORDER — PANTOPRAZOLE SODIUM 40 MG PO TBEC
40.0000 mg | DELAYED_RELEASE_TABLET | Freq: Two times a day (BID) | ORAL | Status: DC
Start: 2018-04-18 — End: 2018-04-18
  Filled 2018-04-18: qty 1

## 2018-04-18 MED ORDER — CARVEDILOL 6.25 MG PO TABS: 6.2500 mg | ORAL_TABLET | Freq: Two times a day (BID) | ORAL | 0 refills | Status: DC

## 2018-04-18 MED ORDER — PANTOPRAZOLE SODIUM 40 MG PO TBEC: 40.0000 mg | DELAYED_RELEASE_TABLET | Freq: Two times a day (BID) | ORAL | 0 refills | Status: DC

## 2018-04-18 MED ORDER — CALCITRIOL 0.5 MCG PO CAPS
ORAL_CAPSULE | ORAL | Status: AC
  Administered 2018-04-18: 0.5 ug via ORAL
  Filled 2018-04-18: qty 1

## 2018-04-18 NOTE — Progress Notes (Signed)
   Subjective:   Ms Lieder was seen while undergoing dialysis this AM she states she is feeling well. She endorses some residual abdominal soreness, which started following her procedure yesterday, but has impvoved significantly since that time. She denies further tiredness. She had bowl movement yesterday and states it appeared normal (non-dark, non-bloody). She confirmed that she has been taking her synthroid every day, but does not always take it alone and 30 minutes before eating. She states she is feeling ready to go home.  Objective:  Vital signs in last 24 hours: Vitals:   04/18/18 0800 04/18/18 1000 04/18/18 1030 04/18/18 1100  BP: (!) 157/77 (!) 176/77 126/68 (!) 143/71  Pulse: 85 90 91 85  Resp:      Temp:      TempSrc:      SpO2:      Weight:      Height:       Physical Exam  Constitutional: She is oriented to person, place, and time. She appears well-developed and well-nourished.  HENT:  Head: Normocephalic and atraumatic.  Cardiovascular: Normal rate, regular rhythm and intact distal pulses.  Systolic Murmur  Pulmonary/Chest: Effort normal and breath sounds normal. No respiratory distress.  Abdominal: Soft. Bowel sounds are normal. She exhibits no distension. There is no tenderness.  Musculoskeletal: She exhibits no edema.  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  Skin: Skin is warm and dry.   Assessment/Plan: 84 y.o. woman with PMHx of ESRD 2/2 HTN (on HD MWF), HTN, GERD, hypothyroidism, nail-patella syndrome who presented from outpatient HD center for acute blood loss anemia.  GI Bleed: Capsule endoscopy showed AVMs in Distal Duodenum and Mid small bowel. The AVMs in distal duodenum were cauterized during push endoscopy on 6/4, but more distal AVM could not be reached and would required double balloon endoscopy at tertiary center if it re-bleeds.  > Hgb stable at 9.4 this AM - Protonix $RemoveBe'40mg'zJZEGZulZ$   BID  HTN: She continues to oscillate between normo and hypertension.  > BP improves briefly following HD > Previously on Amlodipine $RemoveBefor'5mg'ZVjBUAIHUsHa$  Daily and Carvedilol $RemoveBefore'25mg'UgFUhSNSFmsTy$  BID which was discontinued in the past month or so 2/2 hypotension at dialysis sessions - Hydralazine $RemoveBefor'5mg'ZYWwssMwIduV$  PRN (Sys > 180) - Carvedilol 6.$RemoveBefore'25mg'xLIELuGSlRlpU$  BID  Hypothyroidism: TSH checked in setting of A.Fib overnight: 21.06 > Elevated TSH may be 2/2 taking medication incorrectly as opposed to ineffective dose. - Will need to have TSH rechecked as outpatient - Levothyroxine 154mcg - Free T4  New onset A.Fib with Intermittent RVR: > Patient noted to have intermittent tachycardia with appearance of A.fib with RVR for about 2 hours overnight 6/3-6/4. This convered back and forth to normal sinus rhythm spontaneously and has remained normal since. > Patient recently had Carvedilol $RemoveBefore'25mg'qwYKFOqawRyJn$  BID discontinued; this was a part of her hypertension regimen but was likely having a rate control effect as well - Discuss risks and benefits of Anticoagulation in 84 yo patient with AVMs and CHADS2Vasc of 4 - Patient does follow with a cardiologist and will need to follow up as an outpatient. - Checking T4 to evaluate if arrhythmia may have been induced by elevated T4 in the setting of patient now receiving medication appropriately. - Carvedilol 6.$RemoveBefore'25mg'QgzXclcZxcbmU$  BID  ESRD on HD: On HD MWF. > Nephrology following, appreciate their assistance.  Dispo: Anticipated discharge in approximately 0-1 day(s).   Neva Seat, MD 04/18/2018, 11:34 AM Pager: [phone]

## 2018-04-18 NOTE — Procedures (Signed)
I was present at this session.  I have reviewed the session itself and made appropriate changes.  HD via RUA AVF.  Access press ok. bp ^ to start. To get 3.5 L off, follow bps.  tol well thus far.  Jeneen Rinks Talisha Erby 6/5/20198:16 AM

## 2018-04-18 NOTE — Progress Notes (Signed)
Subjective: Interval History: has no complaint , but stom still sore.  Objective: Vital signs in last 24 hours: Temp:  [97.7 F (36.5 C)-99 F (37.2 C)] 98.2 F (36.8 C) (06/05 0715) Pulse Rate:  [76-94] 85 (06/05 0800) Resp:  [16-21] 16 (06/05 0715) BP: (100-205)/(34-91) 157/77 (06/05 0800) SpO2:  [91 %-99 %] 97 % (06/05 0715) Weight:  [54.8 kg (120 lb 13 oz)-54.9 kg (121 lb)] 54.8 kg (120 lb 13 oz) (06/05 0715) Weight change: -1.315 kg (-2 lb 14.4 oz)  Intake/Output from previous day: 06/04 0701 - 06/05 0700 In: 303 [I.V.:303] Out: -  Intake/Output this shift: No intake/output data recorded.  General appearance: alert, cooperative, no distress and pale Resp: clear to auscultation bilaterally Cardio: S1, S2 normal and systolic murmur: systolic ejection 2/6, decrescendo at 2nd left intercostal space GI: soft, non-tender; bowel sounds normal; no masses,  no organomegaly Extremities: avf RUA, contractures at elbows.   Lab Results: Recent Labs    04/17/18 1657 04/18/18 0626  WBC 5.3 4.3  HGB 9.6* 9.4*  HCT 30.4* 29.2*  PLT 153 170   BMET:  Recent Labs    04/17/18 0239 04/17/18 1657  NA 133* 135  K 4.0 4.2  CL 96* 96*  CO2 30 27  GLUCOSE 100* 94  BUN 20 29*  CREATININE 4.87* 6.24*  CALCIUM 7.8* 8.1*   No results for input(s): PTH in the last 72 hours. Iron Studies:  Recent Labs    04/16/18 0816  IRON 29  TIBC 164*  FERRITIN 780*    Studies/Results: No results found.  I have reviewed the patient's current medications.  Assessment/Plan: 1 ESRD for HD.  2 GIB  AVMs 3 Anemia stable on esa 4 HPTH 5 Nail Patella P Hd, follow Hb, max esa,     LOS: 5 days   Jeneen Rinks Mathhew Buysse 04/18/2018,8:18 AM

## 2018-04-18 NOTE — Discharge Summary (Signed)
Name: Alexis Washington MRN: [id number] DOB: Jun 08, 1932 84 y.o. PCP: Rochel Brome, MD  Date of Admission: 04/13/2018  8:03 AM Date of Discharge: 04/18/2018 Attending Physician: Lucious Groves, DO  Discharge Diagnosis:   Principal Problem:   GI bleed Active Problems:   ESRD on dialysis [facility])   Hypertension   GERD (gastroesophageal reflux disease)   Hypothyroidism   Renal angiomyolipoma   Anemia   Paroxysmal atrial fibrillation with rapid ventricular response ([place])   Duodenal arteriovenous malformation  Discharge Medications: Allergies as of 04/18/2018      Reactions   Codeine Nausea And Vomiting   Novocain [procaine] Palpitations      Medication List    TAKE these medications   acetaminophen 500 MG tablet Commonly known as:  TYLENOL Take 500 mg by mouth every 8 (eight) hours as needed for mild pain.   aspirin EC 81 MG tablet Take 81 mg by mouth daily.   calcium acetate 667 MG capsule Commonly known as:  PHOSLO Take 1,334 mg by mouth 3 (three) times daily.   carvedilol 6.25 MG tablet Commonly known as:  COREG Take 1 tablet (6.25 mg total) by mouth 2 (two) times daily with a meal.   CORICIDIN HBP COLD/FLU PO Take 1 tablet by mouth as needed (cold symptoms).   docusate sodium 100 MG capsule Commonly known as:  COLACE Take 1 capsule (100 mg total) by mouth 2 (two) times daily as needed for mild constipation.   fluticasone 50 MCG/ACT nasal spray Commonly known as:  FLONASE Place 1 spray into both nostrils as needed for allergies or rhinitis.   levothyroxine 137 MCG tablet Commonly known as:  SYNTHROID, LEVOTHROID Take 137 mcg by mouth daily.   Lidocaine-Prilocaine (Bulk) 2.5-2.5 % Crea Apply 1 application topically as needed (before dialysis).   NITROSTAT 0.4 MG SL tablet Generic drug:  nitroGLYCERIN Place 0.4 mg under the tongue every 5 (five) minutes as needed for chest pain.   omeprazole 40 MG capsule Commonly known as:  PRILOSEC Take 40 mg by mouth  daily.   OVER THE COUNTER MEDICATION Place 1 drop into both eyes daily as needed (allergy symptoms). Allergy Relief eye drops   pantoprazole 40 MG tablet Commonly known as:  PROTONIX Take 1 tablet (40 mg total) by mouth 2 (two) times daily.   polyethylene glycol packet Commonly known as:  MIRALAX / GLYCOLAX Take 17 g by mouth daily as needed. What changed:  reasons to take this   PROBIOTIC-10 PO Take 1 capsule by mouth daily.   DIGESTIVE ADVANTAGE Caps Take 1 capsule by mouth daily.   raloxifene 60 MG tablet Commonly known as:  EVISTA Take 60 mg by mouth daily.       Disposition and follow-up:   [name] was discharged from [facility] in Stable condition.  At the hospital follow up visit please address:  GI Bleed: - Ensure no recurrence of symptoms - Ensure adherence to PPI  HTN: - Amlodipine $RemoveBefo'5mg'pTYTssjFYeq$  Daily and Carvedilol $RemoveBefore'25mg'DFpjtvvpkOYJD$  BID recently discontinued over concern for hypotension during HD - Remained Hypertensive during admission except for right after HD - Carvedilol 6.$RemoveBefore'25mg'TGgwBXfiMowYE$  BID started - Continue medication titration based on BP and tolerance of HD  Hypothyroidism: - TSH: 21.06 - May be 2/2 taking medication incorrectly as opposed to ineffective dose. - Continued on Levothyroxine 181mcg - Follow up Free T4   New onset A.Fib with Intermittent RVR: - Patient experience brief episode of paroxysmal A. fib with RVR -  Anticoagulation deferred given recent GI bleed and age - Patient to follow up with cardiologist for further discussion and potential testing  2.  Labs / imaging needed at time of follow-up: TSH  3.  Pending labs/ test needing follow-up: Free T4  Follow-up Appointments:   Hospital Course by problem list: Principal Problem:   GI bleed Active Problems:   ESRD on dialysis ([place])   Hypertension   GERD (gastroesophageal reflux disease)   Hypothyroidism   Renal angiomyolipoma   Anemia   Paroxysmal atrial fibrillation with  rapid ventricular response (HCC)   Duodenal arteriovenous malformation   GI Bleed: Patient presented from outpatient HD center after Hgb was noted to have dropped from 10.4 to 7.5. Patient reported fatigue and melanotic stools. EGD was unremarkable. Hgb dropped to 6.9 and patient was transfused 1 unit of pRBCs and Hgb improved to ~9 and remained stable. Capsule endoscopy showed AVMs in Distal Duodenum and Mid small bowel. The AVMs in distal duodenum were cauterized during push endoscopy on 6/4, but more distal AVM could not be reached, but was non bleeding. Patient discharge on PO Protonix $RemoveBef'40mg'gsVCOBTvTp$  BID.  HTN: Patient presented with HTN to the 287O Systolic, which improved during and immediately after HD, but then worsened again over time. She had previously been on Amlodipine $RemoveBefor'5mg'fVmaHWDPAzHi$  Daily and Carvedilol $RemoveBefore'25mg'LFDcDcXaWnffY$  BID, but this was discontinued in the past month due to hypotension at dialysis sessions. Patient was restarted on Carvedilol at 6.25 BID and is to follow up with her nephrologist and PCP regarding further dose adjustment.  Hypothyroidism: TSH checked in setting of A.Fib overnight: 21.06. Elevated TSH may be 2/2 taking medication incorrectly as opposed to ineffective dose. Continued on Levothyroxine 145mcg. Free T4 obtained but not resulted. Will need to have TSH rechecked as outpatient  New onset A.Fib with Intermittent RVR: Patient noted to have intermittent tachycardia with appearance of A.fib with RVR for about 2 hours overnight 6/3-6/4. This converted back and forth to normal sinus rhythm spontaneously and has remained in sinus rhythm since that night. Patient had until recently been on Carvedilol $RemoveBefor'25mg'yaooVUmjqeiT$  BID as a part of her hypertension regimen, but was likely having a rate control effect as well. Free T4 ordered to evaluate if arrhythmia may have been induced by elevated T4 in the setting of patient now receiving medication appropriately (had not been taken as instructed prior to admission). Discuss  risks and benefits of Anticoagulation in 84 yo patient with AVMs and CHADS2Vasc of 4. Anticoagulation deferred at this time. Patient to follow up with her cardiologist and will need to follow up as an outpatient.  Discharge Vitals:   BP (!) 143/61 (BP Location: Left Arm)   Pulse 85   Temp 99.2 F (37.3 C) (Oral)   Resp 20   Ht $R'5\' 6"'Pu$  (1.676 m)   Wt 123 lb 0.3 oz (55.8 kg)   SpO2 94%   BMI 19.86 kg/m   Pertinent Labs, Studies, and Procedures:  CBC Latest Ref Rng & Units 04/18/2018 04/17/2018 04/17/2018  WBC 4.0 - 10.5 K/uL 4.3 5.3 4.0  Hemoglobin 12.0 - 15.0 g/dL 9.4(L) 9.6(L) 8.6(L)  Hematocrit 36.0 - 46.0 % 29.2(L) 30.4(L) 26.5(L)  Platelets 150 - 400 K/uL 170 153 140(L)   CMP Latest Ref Rng & Units 04/18/2018 04/17/2018 04/17/2018  Glucose 65 - 99 mg/dL 130(H) 94 100(H)  BUN 6 - 20 mg/dL 40(H) 29(H) 20  Creatinine 0.44 - 1.00 mg/dL 7.54(H) 6.24(H) 4.87(H)  Sodium 135 - 145 mmol/L 133(L) 135 133(L)  Potassium 3.5 - 5.1 mmol/L 3.7 4.2 4.0  Chloride 101 - 111 mmol/L 94(L) 96(L) 96(L)  CO2 22 - 32 mmol/L $RemoveB'26 27 30  'PJsfKNcX$ Calcium 8.9 - 10.3 mg/dL 8.0(L) 8.1(L) 7.8(L)  Total Protein 6.5 - 8.1 g/dL - - -  Total Bilirubin 0.3 - 1.2 mg/dL - - -  Alkaline Phos 38 - 126 U/L - - -  AST 15 - 41 U/L - - -  ALT 14 - 54 U/L - - -   CXR: IMPRESSION: - Small left pleural effusion and left base atelectasis, both improving since prior study. - Stable cardiomegaly.  EGD: IMPRESSION: - No source of heme positive stool or anemia seen on this exam. - Widely patent and mild Schatzki ring. - 2 cm hiatal hernia. - Normal stomach. - Normal examined duodenum. - No specimens collected.  Capsule Endoscopy: - Fresh blood in proximal duodenum likely due to a bleeding AVM. Small nonbleeding mid-small bowel AVM also noted.   Push Enteroscopy: IMPRESSION: - The examined portion of the jejunum was normal. - Normal mucosa was found in the second portion of the duodenum, in the third portion of the duodenum and in the  fourth portion of the duodenum. - A few non-bleeding angiodysplastic lesions in the duodenum. Treated with argon plasma coagulation (APC).  Discharge Instructions: Discharge Instructions    Call MD for:  difficulty breathing, headache or visual disturbances   Complete by:  As directed    Call MD for:  persistant dizziness or light-headedness   Complete by:  As directed    Call MD for:  persistant nausea and vomiting   Complete by:  As directed    Call MD for:  severe uncontrolled pain   Complete by:  As directed    Call MD for:  temperature >100.4   Complete by:  As directed    Discharge instructions   Complete by:  As directed    Thank you for allowing [place] to care for you  Your symptoms were caused by a GI bleed. - Most of the potential sources of bleeding were addressed during your procedure - You have been started on Protonix $RemoveBef'40mg'peTmREFZuD$ , Twice a day - If you experience a repeat of similar symptoms please seek medical care  For your high blood pressure - We have restarted your Carvedilol at a lower dose of 6.$RemoveB'25mg'AHneqHTO$  Twice a day - Your nephrologist and PCP may make further adjustments  For your low thyroid levels - Thyroid levels were low this admission - This may be because you need a new dose or because you were not taking your medications the same way every day - This should be rechecked by your PCP  For your atrial flutter (heart palpitation) - You experienced a brief episode of this - Please follow up with your cardiologist to consider further testing - No blood thinners at this time  Please follow up with your PCP in the next 1-2 weeks  Please follow up with you cardiologist as well   Increase activity slowly   Complete by:  As directed       Signed: Neva Seat, MD 04/18/2018, 8:22 PM   Pager: [phone]

## 2018-04-18 NOTE — Anesthesia Postprocedure Evaluation (Signed)
Anesthesia Post Note  Patient: Alexis Washington  Procedure(s) Performed: ENTEROSCOPY (N/A ) HOT HEMOSTASIS (ARGON PLASMA COAGULATION/BICAP) (N/A )     Patient location during evaluation: PACU Anesthesia Type: MAC Level of consciousness: awake and alert Pain management: pain level controlled Vital Signs Assessment: post-procedure vital signs reviewed and stable Respiratory status: spontaneous breathing, nonlabored ventilation, respiratory function stable and patient connected to nasal cannula oxygen Cardiovascular status: stable and blood pressure returned to baseline Postop Assessment: no apparent nausea or vomiting Anesthetic complications: no    Last Vitals:  Vitals:   04/18/18 1212 04/18/18 1214  BP: (!) 147/68 (!) 151/70  Pulse: 99 (!) 101  Resp:    Temp:    SpO2:      Last Pain:  Vitals:   04/18/18 1147  TempSrc: Oral  PainSc: 0-No pain   Pain Goal: Patients Stated Pain Goal: 0 (04/16/18 1302)               Greysyn Vanderberg

## 2018-04-20 DIAGNOSIS — N2581 Secondary hyperparathyroidism of renal origin: Secondary | ICD-10-CM | POA: Diagnosis not present

## 2018-04-20 DIAGNOSIS — D631 Anemia in chronic kidney disease: Secondary | ICD-10-CM | POA: Diagnosis not present

## 2018-04-20 DIAGNOSIS — R52 Pain, unspecified: Secondary | ICD-10-CM | POA: Diagnosis not present

## 2018-04-20 DIAGNOSIS — E876 Hypokalemia: Secondary | ICD-10-CM | POA: Diagnosis not present

## 2018-04-20 DIAGNOSIS — D509 Iron deficiency anemia, unspecified: Secondary | ICD-10-CM | POA: Diagnosis not present

## 2018-04-20 DIAGNOSIS — N186 End stage renal disease: Secondary | ICD-10-CM | POA: Diagnosis not present

## 2018-04-26 DIAGNOSIS — I480 Paroxysmal atrial fibrillation: Secondary | ICD-10-CM | POA: Diagnosis not present

## 2018-04-26 DIAGNOSIS — I10 Essential (primary) hypertension: Secondary | ICD-10-CM | POA: Diagnosis not present

## 2018-04-26 DIAGNOSIS — D500 Iron deficiency anemia secondary to blood loss (chronic): Secondary | ICD-10-CM | POA: Diagnosis not present

## 2018-04-26 DIAGNOSIS — Z6822 Body mass index (BMI) 22.0-22.9, adult: Secondary | ICD-10-CM | POA: Diagnosis not present

## 2018-04-26 DIAGNOSIS — N186 End stage renal disease: Secondary | ICD-10-CM | POA: Diagnosis not present

## 2018-04-26 DIAGNOSIS — R600 Localized edema: Secondary | ICD-10-CM | POA: Diagnosis not present

## 2018-04-26 DIAGNOSIS — E038 Other specified hypothyroidism: Secondary | ICD-10-CM | POA: Diagnosis not present

## 2018-04-27 DIAGNOSIS — R0602 Shortness of breath: Secondary | ICD-10-CM | POA: Diagnosis not present

## 2018-04-27 DIAGNOSIS — E782 Mixed hyperlipidemia: Secondary | ICD-10-CM | POA: Diagnosis not present

## 2018-05-01 ENCOUNTER — Ambulatory Visit: Payer: PPO | Admitting: Cardiology

## 2018-05-02 DIAGNOSIS — E038 Other specified hypothyroidism: Secondary | ICD-10-CM | POA: Diagnosis not present

## 2018-05-03 ENCOUNTER — Encounter: Payer: Self-pay | Admitting: Cardiology

## 2018-05-03 ENCOUNTER — Ambulatory Visit (INDEPENDENT_AMBULATORY_CARE_PROVIDER_SITE_OTHER): Payer: PPO | Admitting: Cardiology

## 2018-05-03 VITALS — BP 126/70 | HR 70 | Ht 66.0 in | Wt 128.0 lb

## 2018-05-03 DIAGNOSIS — Z992 Dependence on renal dialysis: Secondary | ICD-10-CM

## 2018-05-03 DIAGNOSIS — I10 Essential (primary) hypertension: Secondary | ICD-10-CM

## 2018-05-03 DIAGNOSIS — D631 Anemia in chronic kidney disease: Secondary | ICD-10-CM

## 2018-05-03 DIAGNOSIS — N186 End stage renal disease: Secondary | ICD-10-CM

## 2018-05-03 DIAGNOSIS — I480 Paroxysmal atrial fibrillation: Secondary | ICD-10-CM | POA: Diagnosis not present

## 2018-05-03 NOTE — Patient Instructions (Signed)

## 2018-05-03 NOTE — Progress Notes (Signed)
Cardiology Office Note:    Date:  05/03/2018   ID:  Alexis Washington, [place] 1931-11-29, MRN [id number]  PCP:  Rochel Brome, MD  Cardiologist:  Jenean Lindau, MD   Referring MD: Rochel Brome, MD    ASSESSMENT:    1. Paroxysmal atrial fibrillation with rapid ventricular response (HCC)   2. Essential hypertension   3. ESRD on dialysis ([place])   4. Anemia in chronic kidney disease, on chronic dialysis (HCC)    PLAN:    In order of problems listed above:  1. Her blood pressure is stable.I discussed with the patient atrial fibrillation, disease process. Management and therapy including rate and rhythm control, anticoagulation benefits and potential risks were discussed extensively with the patient. Patient had multiple questions which were answered to patient's satisfaction. 2. The patient is obviously not on anticoagulation because of significant GI bleed necessitating transfusion.  Her doctor has told her to be on 81 mg of coated aspirin and she is on it at this time.  I told him to check with the physician that when appropriate she needs to be on 325 mg of coated aspirin for some prevention for approximately fibrillation in the vocalized understanding.  At this time I personally do not think she is a candidate for anticoagulation because of history of GI bleeding and the fact that she is frail and not stable with her gait. 3. Patient will be seen in follow-up appointment in 6 months or earlier if the patient has any concerns .    Medication Adjustments/Labs and Tests Ordered: Current medicines are reviewed at length with the patient today.  Concerns regarding medicines are outlined above.  No orders of the defined types were placed in this encounter.  No orders of the defined types were placed in this encounter.    No chief complaint on file.    History of Present Illness:    Alexis Washington is a 84 y.o. female.  The patient has a history of essential hypertension, end-stage renal  disease on dialysis.  She was in the hospital for a GI bleed and received transfusion and evaluation.  Subsequently she mentions to me that she was having paroxysms of atrial fibrillation while in the hospital.  No chest pain orthopnea or PND.  She is a frail lady.  At the time of my evaluation, the patient is alert awake oriented and in no distress.  Her husband accompanies her for this visit.  Past Medical History:  Diagnosis Date  . Anemia in chronic renal disease    ESRD- Sees Dr Justin Mend  . Arthritis   . Benign lipomatous neoplasm of kidney   . Complication of anesthesia 1998   "blood pressure dropped low during surgery and after surgery it went up really high."   . Diverticulitis   . ESRD (end stage renal disease) on dialysis [facility])    "MWF; [facility]" (11/08/2017)  . GERD (gastroesophageal reflux disease)   . Heart murmur    PCP- said it is nothing to be concerned  . HOH (hard of hearing)   . Hyperkalemia   . Hyperphosphatemia   . Hypertension   . Hypertensive kidney disease   . Hypothyroidism   . Nail-patella syndrome   . PONV (postoperative nausea and vomiting)   . Proteinuria   . Shortness of breath dyspnea     Past Surgical History:  Procedure Laterality Date  . ABDOMINAL HYSTERECTOMY    . APPENDECTOMY    . AV FISTULA PLACEMENT  Right 08/10/2015   Procedure: Right Arm Arteriovenous Brachio-cephalic Fistula ;  Surgeon: Conrad [place], MD;  Location: [place];  Service: Vascular;  Laterality: Right;  . BASCILIC VEIN TRANSPOSITION Right 12/15/2015   Procedure: SECOND STAGE Right Arm BRACHIAL VEIN TRANSPOSITION;  Surgeon: Conrad Beaver, MD;  Location: [place];  Service: Vascular;  Laterality: Right;  . CATARACT EXTRACTION W/ INTRAOCULAR LENS  IMPLANT, BILATERAL    . CHOLECYSTECTOMY  1998  . COLONOSCOPY    . ENTEROSCOPY N/A 04/17/2018   Procedure: ENTEROSCOPY;  Surgeon: Wilford Corner, MD;  Location: [facility] ENDOSCOPY;  Service: Endoscopy;  Laterality: N/A;  .  ESOPHAGOGASTRODUODENOSCOPY (EGD) WITH PROPOFOL N/A 04/14/2018   Procedure: ESOPHAGOGASTRODUODENOSCOPY (EGD) WITH PROPOFOL;  Surgeon: Ronald Lobo, MD;  Location: [place];  Service: Endoscopy;  Laterality: N/A;  . FISTULA SUPERFICIALIZATION Right 08/10/2015   Procedure: Right First Stage Brachial Transposition;  Surgeon: Conrad Alvarado, MD;  Location: [place];  Service: Vascular;  Laterality: Right;  . GIVENS CAPSULE STUDY N/A 04/14/2018   Procedure: GIVENS CAPSULE STUDY;  Surgeon: Ronald Lobo, MD;  Location: [facility] ENDOSCOPY;  Service: Endoscopy;  Laterality: N/A;  . HOT HEMOSTASIS N/A 04/17/2018   Procedure: HOT HEMOSTASIS (ARGON PLASMA COAGULATION/BICAP);  Surgeon: Wilford Corner, MD;  Location: [place];  Service: Endoscopy;  Laterality: N/A;  . INSERTION OF DIALYSIS CATHETER N/A 08/10/2015   Procedure: INSERTION OF Right Internal Jugular DIALYSIS CATHETER;  Surgeon: Conrad [place], MD;  Location: [place];  Service: Vascular;  Laterality: N/A;  . IR THORACENTESIS ASP PLEURAL SPACE W/IMG GUIDE  05/11/2017  . TONSILLECTOMY      Current Medications: Current Meds  Medication Sig  . acetaminophen (TYLENOL) 500 MG tablet Take 500 mg by mouth every 8 (eight) hours as needed for mild pain.  Marland Kitchen aspirin EC 81 MG tablet Take 81 mg by mouth daily.  . calcium acetate (PHOSLO) 667 MG capsule Take 1,334 mg by mouth 3 (three) times daily.   . carvedilol (COREG) 6.25 MG tablet Take 1 tablet (6.25 mg total) by mouth 2 (two) times daily with a meal.  . Chlorpheniramine-Acetaminophen (CORICIDIN HBP COLD/FLU PO) Take 1 tablet by mouth as needed (cold symptoms).  . docusate sodium (COLACE) 100 MG capsule Take 1 capsule (100 mg total) by mouth 2 (two) times daily as needed for mild constipation.  . fluticasone (FLONASE) 50 MCG/ACT nasal spray Place 1 spray into both nostrils as needed for allergies or rhinitis.  Marland Kitchen levothyroxine (SYNTHROID, LEVOTHROID) 137 MCG tablet Take 137 mcg by mouth daily.  .  Lidocaine-Prilocaine, Bulk, 2.5-2.5 % CREA Apply 1 application topically as needed (before dialysis).  Marland Kitchen NITROSTAT 0.4 MG SL tablet Place 0.4 mg under the tongue every 5 (five) minutes as needed for chest pain.   Marland Kitchen omeprazole (PRILOSEC) 40 MG capsule Take 40 mg by mouth daily.  Marland Kitchen OVER THE COUNTER MEDICATION Place 1 drop into both eyes daily as needed (allergy symptoms). Allergy Relief eye drops  . pantoprazole (PROTONIX) 40 MG tablet Take 1 tablet (40 mg total) by mouth 2 (two) times daily.  . polyethylene glycol (MIRALAX / GLYCOLAX) packet Take 17 g by mouth daily as needed. (Patient taking differently: Take 17 g by mouth daily as needed for mild constipation. )  . Probiotic Product (DIGESTIVE ADVANTAGE) CAPS Take 1 capsule by mouth daily.  . Probiotic Product (PROBIOTIC-10 PO) Take 1 capsule by mouth daily.  . raloxifene (EVISTA) 60 MG tablet Take 60 mg by mouth daily.     Allergies:  Codeine and Novocain [procaine]   Social History   Socioeconomic History  . Marital status: Married    Spouse name: Not on file  . Number of children: Not on file  . Years of education: Not on file  . Highest education level: Not on file  Occupational History  . Not on file  Social Needs  . Financial resource strain: Not on file  . Food insecurity:    Worry: Not on file    Inability: Not on file  . Transportation needs:    Medical: Not on file    Non-medical: Not on file  Tobacco Use  . Smoking status: Never Smoker  . Smokeless tobacco: Never Used  Substance and Sexual Activity  . Alcohol use: No    Alcohol/week: 0.0 oz  . Drug use: No  . Sexual activity: Not on file  Lifestyle  . Physical activity:    Days per week: Not on file    Minutes per session: Not on file  . Stress: Not on file  Relationships  . Social connections:    Talks on phone: Not on file    Gets together: Not on file    Attends religious service: Not on file    Active member of club or organization: Not on file     Attends meetings of clubs or organizations: Not on file    Relationship status: Not on file  Other Topics Concern  . Not on file  Social History Narrative  . Not on file     Family History: The patient's family history includes Heart attack in her father; Heart disease in her father; Hyperlipidemia in her father; Hypertension in her father and mother.  ROS:   Please see the history of present illness.    All other systems reviewed and are negative.  EKGs/Labs/Other Studies Reviewed:    The following studies were reviewed today: I reviewed records and discussed with the patient at length.  Clinically she is in sinus rhythm today.   Recent Labs: 11/08/2017: B Natriuretic Peptide 2,422.4 04/13/2018: ALT 16 04/17/2018: Magnesium 1.9; TSH 21.068 04/18/2018: BUN 40; Creatinine, Ser 7.54; Hemoglobin 9.4; Platelets 170; Potassium 3.7; Sodium 133  Recent Lipid Panel No results found for: CHOL, TRIG, HDL, CHOLHDL, VLDL, LDLCALC, LDLDIRECT  Physical Exam:    VS:  BP 126/70 (BP Location: Left Arm, Patient Position: Sitting, Cuff Size: Normal)   Pulse 70   Ht $R'5\' 6"'xz$  (1.676 m)   Wt 128 lb (58.1 kg)   SpO2 91%   BMI 20.66 kg/m     Wt Readings from Last 3 Encounters:  05/03/18 128 lb (58.1 kg)  04/18/18 123 lb 0.3 oz (55.8 kg)  04/05/18 123 lb 1.9 oz (55.8 kg)     GEN: Patient is in no acute distress HEENT: Normal NECK: No JVD; No carotid bruits LYMPHATICS: No lymphadenopathy CARDIAC: Hear sounds regular, 2/6 systolic murmur at the apex. RESPIRATORY:  Clear to auscultation without rales, wheezing or rhonchi  ABDOMEN: Soft, non-tender, non-distended MUSCULOSKELETAL:  No edema; No deformity  SKIN: Warm and dry NEUROLOGIC:  Alert and oriented x 3 PSYCHIATRIC:  Normal affect   Signed, Jenean Lindau, MD  05/03/2018 10:48 AM    [place]

## 2018-05-10 DIAGNOSIS — D500 Iron deficiency anemia secondary to blood loss (chronic): Secondary | ICD-10-CM | POA: Diagnosis not present

## 2018-05-10 DIAGNOSIS — R5383 Other fatigue: Secondary | ICD-10-CM | POA: Diagnosis not present

## 2018-05-10 DIAGNOSIS — N186 End stage renal disease: Secondary | ICD-10-CM | POA: Diagnosis not present

## 2018-05-10 DIAGNOSIS — R531 Weakness: Secondary | ICD-10-CM | POA: Diagnosis not present

## 2018-05-13 DIAGNOSIS — N269 Renal sclerosis, unspecified: Secondary | ICD-10-CM | POA: Diagnosis not present

## 2018-05-13 DIAGNOSIS — Z992 Dependence on renal dialysis: Secondary | ICD-10-CM | POA: Diagnosis not present

## 2018-05-13 DIAGNOSIS — N186 End stage renal disease: Secondary | ICD-10-CM | POA: Diagnosis not present

## 2018-05-14 DIAGNOSIS — E876 Hypokalemia: Secondary | ICD-10-CM | POA: Diagnosis not present

## 2018-05-14 DIAGNOSIS — D631 Anemia in chronic kidney disease: Secondary | ICD-10-CM | POA: Diagnosis not present

## 2018-05-14 DIAGNOSIS — N2581 Secondary hyperparathyroidism of renal origin: Secondary | ICD-10-CM | POA: Diagnosis not present

## 2018-05-14 DIAGNOSIS — R52 Pain, unspecified: Secondary | ICD-10-CM | POA: Diagnosis not present

## 2018-05-14 DIAGNOSIS — D509 Iron deficiency anemia, unspecified: Secondary | ICD-10-CM | POA: Diagnosis not present

## 2018-05-14 DIAGNOSIS — I129 Hypertensive chronic kidney disease with stage 1 through stage 4 chronic kidney disease, or unspecified chronic kidney disease: Secondary | ICD-10-CM | POA: Diagnosis not present

## 2018-05-14 DIAGNOSIS — N186 End stage renal disease: Secondary | ICD-10-CM | POA: Diagnosis not present

## 2018-05-15 ENCOUNTER — Other Ambulatory Visit: Payer: Self-pay | Admitting: Internal Medicine

## 2018-05-27 DIAGNOSIS — R0602 Shortness of breath: Secondary | ICD-10-CM | POA: Diagnosis not present

## 2018-05-27 DIAGNOSIS — E782 Mixed hyperlipidemia: Secondary | ICD-10-CM | POA: Diagnosis not present

## 2018-05-28 DIAGNOSIS — E038 Other specified hypothyroidism: Secondary | ICD-10-CM | POA: Diagnosis not present

## 2018-05-29 DIAGNOSIS — R112 Nausea with vomiting, unspecified: Secondary | ICD-10-CM | POA: Diagnosis not present

## 2018-05-29 DIAGNOSIS — K5904 Chronic idiopathic constipation: Secondary | ICD-10-CM | POA: Diagnosis not present

## 2018-05-29 DIAGNOSIS — R531 Weakness: Secondary | ICD-10-CM | POA: Diagnosis not present

## 2018-05-29 DIAGNOSIS — E038 Other specified hypothyroidism: Secondary | ICD-10-CM | POA: Diagnosis not present

## 2018-06-13 DIAGNOSIS — Z992 Dependence on renal dialysis: Secondary | ICD-10-CM | POA: Diagnosis not present

## 2018-06-13 DIAGNOSIS — N186 End stage renal disease: Secondary | ICD-10-CM | POA: Diagnosis not present

## 2018-06-13 DIAGNOSIS — N269 Renal sclerosis, unspecified: Secondary | ICD-10-CM | POA: Diagnosis not present

## 2018-06-15 DIAGNOSIS — D631 Anemia in chronic kidney disease: Secondary | ICD-10-CM | POA: Diagnosis not present

## 2018-06-15 DIAGNOSIS — N186 End stage renal disease: Secondary | ICD-10-CM | POA: Diagnosis not present

## 2018-06-15 DIAGNOSIS — D689 Coagulation defect, unspecified: Secondary | ICD-10-CM | POA: Diagnosis not present

## 2018-06-15 DIAGNOSIS — N2581 Secondary hyperparathyroidism of renal origin: Secondary | ICD-10-CM | POA: Diagnosis not present

## 2018-06-15 DIAGNOSIS — D509 Iron deficiency anemia, unspecified: Secondary | ICD-10-CM | POA: Diagnosis not present

## 2018-06-15 DIAGNOSIS — E876 Hypokalemia: Secondary | ICD-10-CM | POA: Diagnosis not present

## 2018-06-19 DIAGNOSIS — L720 Epidermal cyst: Secondary | ICD-10-CM | POA: Diagnosis not present

## 2018-06-27 DIAGNOSIS — E782 Mixed hyperlipidemia: Secondary | ICD-10-CM | POA: Diagnosis not present

## 2018-06-27 DIAGNOSIS — R0602 Shortness of breath: Secondary | ICD-10-CM | POA: Diagnosis not present

## 2018-07-03 DIAGNOSIS — R531 Weakness: Secondary | ICD-10-CM | POA: Diagnosis not present

## 2018-07-03 DIAGNOSIS — I10 Essential (primary) hypertension: Secondary | ICD-10-CM | POA: Diagnosis not present

## 2018-07-03 DIAGNOSIS — K219 Gastro-esophageal reflux disease without esophagitis: Secondary | ICD-10-CM | POA: Diagnosis not present

## 2018-07-10 DIAGNOSIS — L720 Epidermal cyst: Secondary | ICD-10-CM | POA: Diagnosis not present

## 2018-07-14 DIAGNOSIS — N269 Renal sclerosis, unspecified: Secondary | ICD-10-CM | POA: Diagnosis not present

## 2018-07-14 DIAGNOSIS — N186 End stage renal disease: Secondary | ICD-10-CM | POA: Diagnosis not present

## 2018-07-14 DIAGNOSIS — Z992 Dependence on renal dialysis: Secondary | ICD-10-CM | POA: Diagnosis not present

## 2018-07-16 DIAGNOSIS — D689 Coagulation defect, unspecified: Secondary | ICD-10-CM | POA: Diagnosis not present

## 2018-07-16 DIAGNOSIS — E8770 Fluid overload, unspecified: Secondary | ICD-10-CM | POA: Diagnosis not present

## 2018-07-16 DIAGNOSIS — D631 Anemia in chronic kidney disease: Secondary | ICD-10-CM | POA: Diagnosis not present

## 2018-07-16 DIAGNOSIS — N186 End stage renal disease: Secondary | ICD-10-CM | POA: Diagnosis not present

## 2018-07-16 DIAGNOSIS — R52 Pain, unspecified: Secondary | ICD-10-CM | POA: Diagnosis not present

## 2018-07-16 DIAGNOSIS — N2581 Secondary hyperparathyroidism of renal origin: Secondary | ICD-10-CM | POA: Diagnosis not present

## 2018-07-28 DIAGNOSIS — E782 Mixed hyperlipidemia: Secondary | ICD-10-CM | POA: Diagnosis not present

## 2018-07-28 DIAGNOSIS — R0602 Shortness of breath: Secondary | ICD-10-CM | POA: Diagnosis not present

## 2018-07-31 DIAGNOSIS — Z6823 Body mass index (BMI) 23.0-23.9, adult: Secondary | ICD-10-CM | POA: Diagnosis not present

## 2018-07-31 DIAGNOSIS — E038 Other specified hypothyroidism: Secondary | ICD-10-CM | POA: Diagnosis not present

## 2018-07-31 DIAGNOSIS — Z0000 Encounter for general adult medical examination without abnormal findings: Secondary | ICD-10-CM | POA: Diagnosis not present

## 2018-08-06 IMAGING — CT CT CHEST W/O CM
2 of 3 series · 15 of 36 positions shown, 18 images · non-contrast
Comparison: 05/10/2017 and prior chest radiographs. 05/01/2009
abdominal CT

CLINICAL DATA: 85-year-old female with shortness of breath and
bilateral pleural effusions.

EXAM:
CT CHEST WITHOUT CONTRAST
TECHNIQUE: Multidetector CT imaging of the chest was performed following the
standard protocol without IV contrast.

[Series 4: thorax 2.0 · axial · 0.72mm/px · z∈[+1385,+1657]mm · 12 of 160 slices shown, 15 images]
[im 12/160  mediastinal]
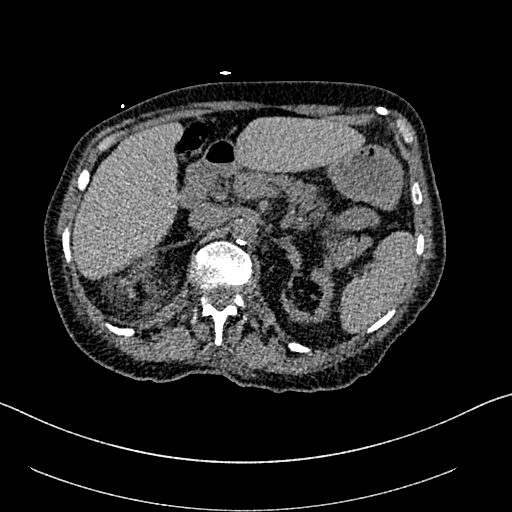
[im 12/160  lung]
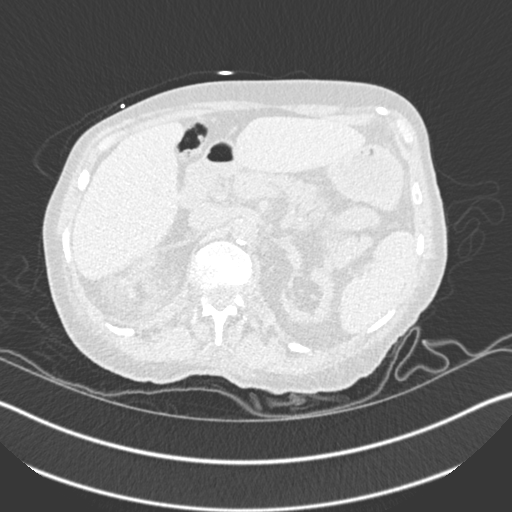
[im 24/160  lung]
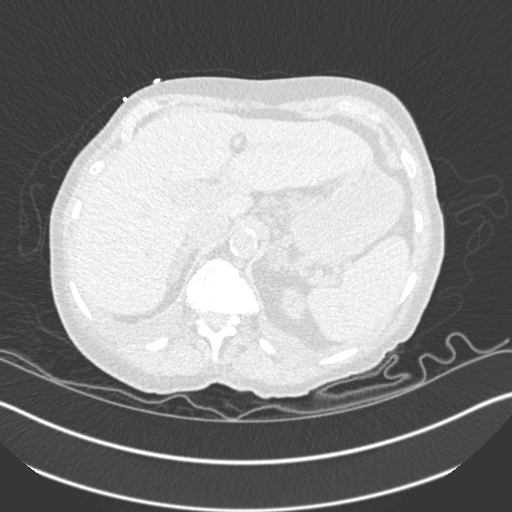
[im 36/160  lung]
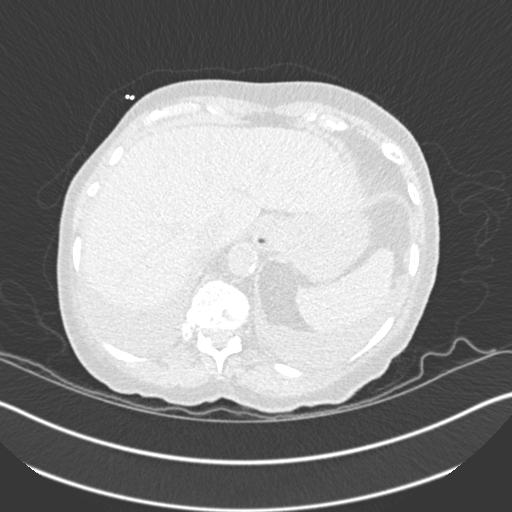
[im 48/160  lung]
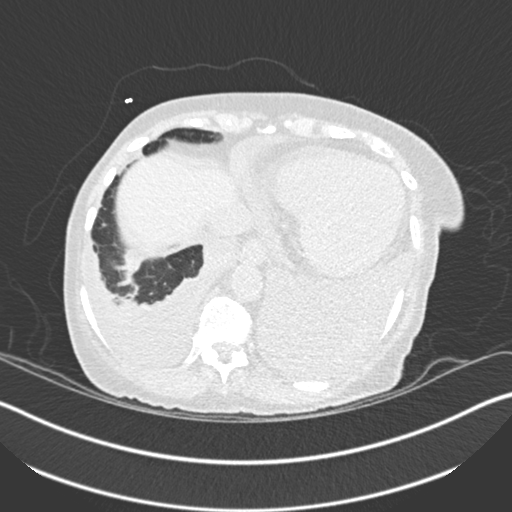
[im 59/160  mediastinal]
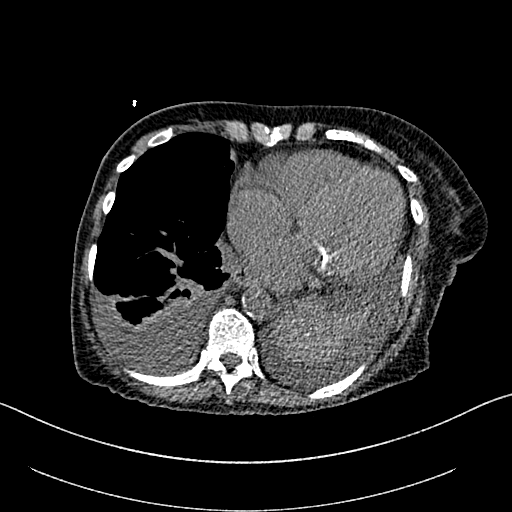
[im 59/160  lung]
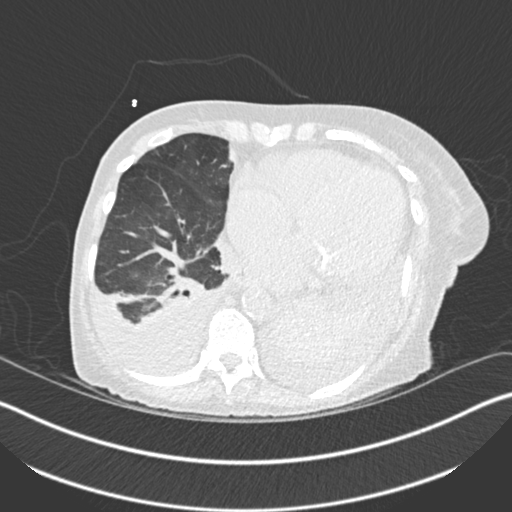
[im 71/160  lung]
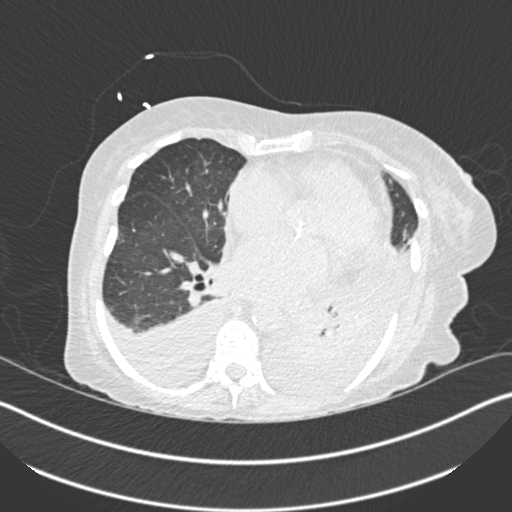
[im 89/160  lung]
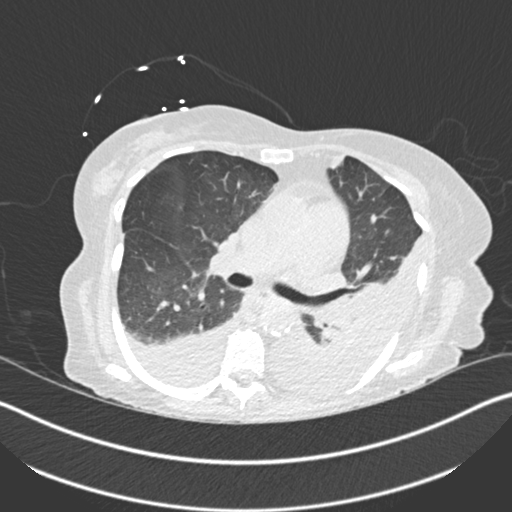
[im 101/160  lung]
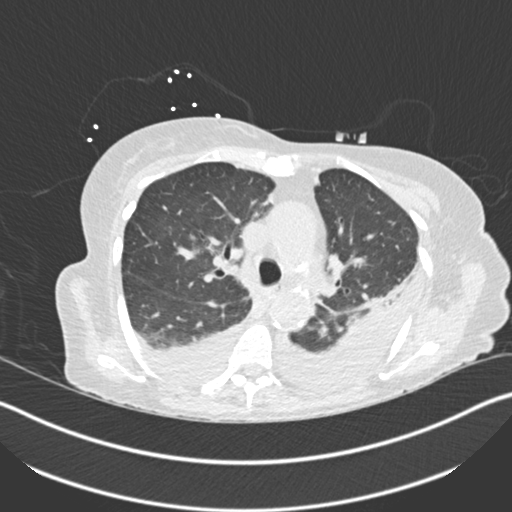
[im 112/160  mediastinal]
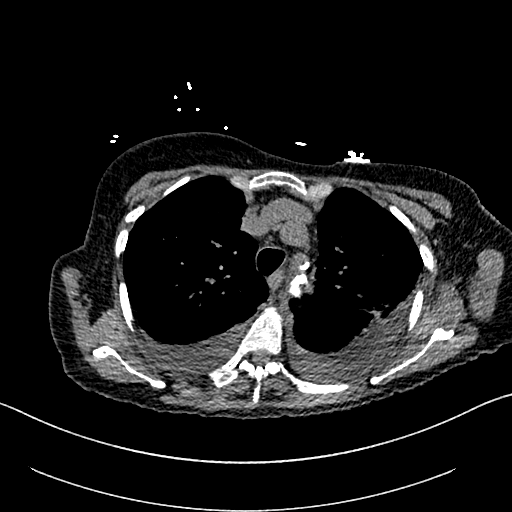
[im 112/160  lung]
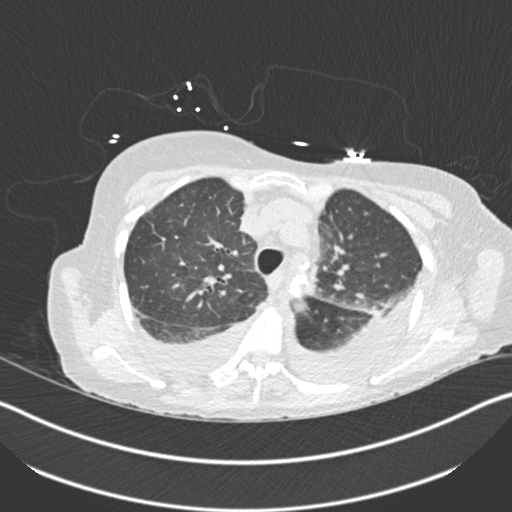
[im 124/160  lung]
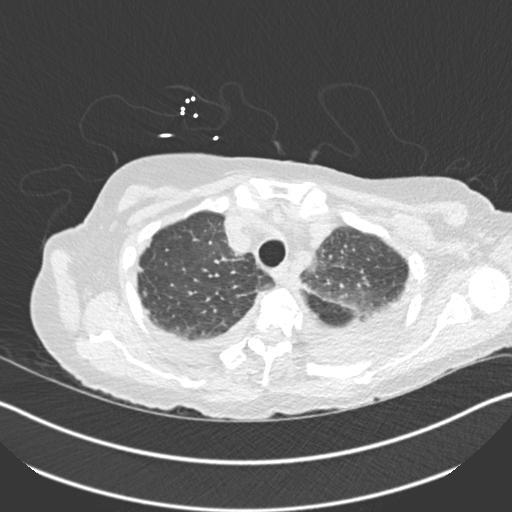
[im 136/160  lung]
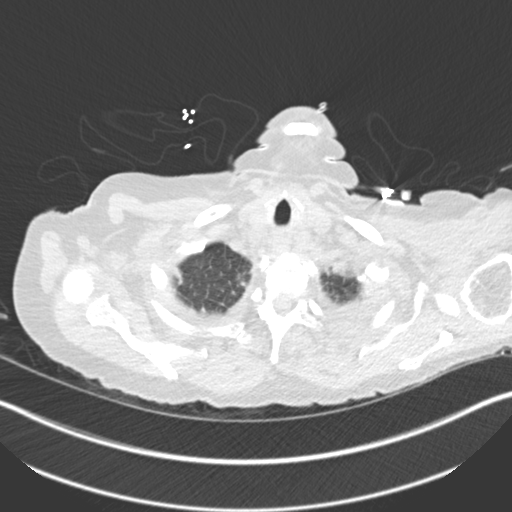
[im 148/160  lung]
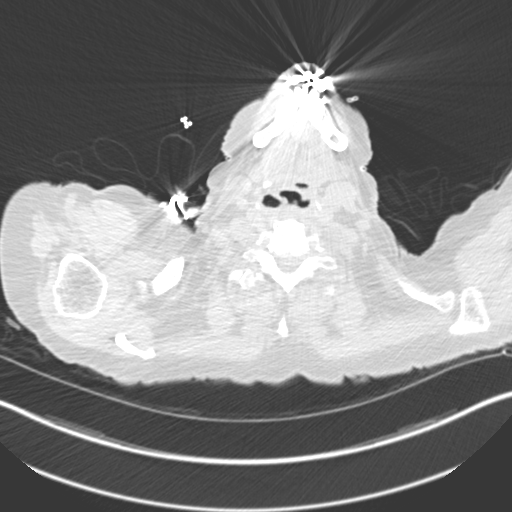

[Series 6: coronal · coronal · 0.62mm/px · 3 of 77 slices shown]
[im 16/77  lung]
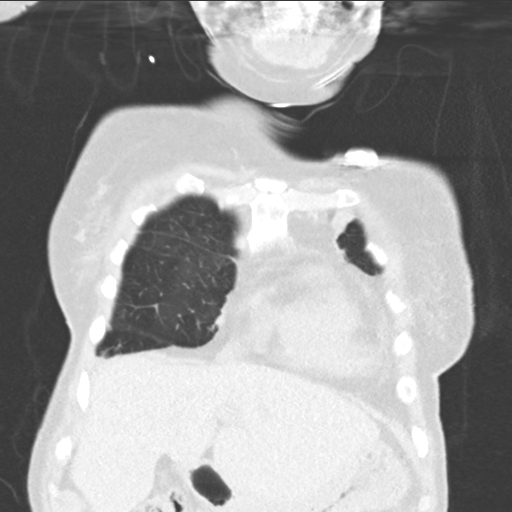
[im 31/77  lung]
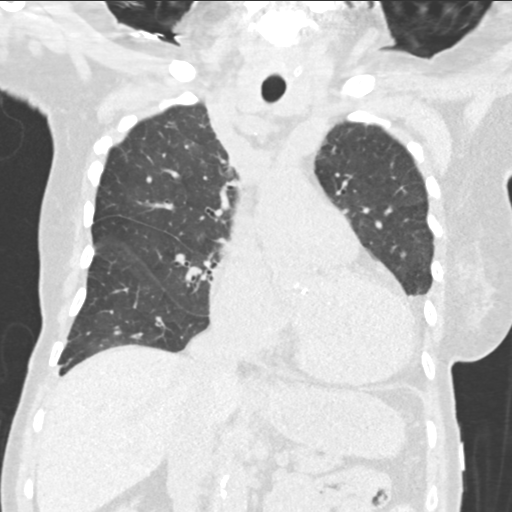
[im 46/77  lung]
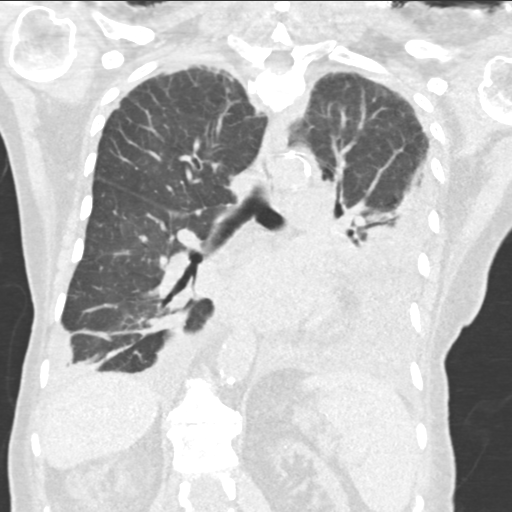

[15 of 36 positions shown; findings below may reference images not displayed]

FINDINGS: Cardiovascular: Cardiomegaly noted. Coronary artery and thoracic
aortic atherosclerotic calcifications noted. There is no evidence of
thoracic aortic aneurysm. A very small pericardial effusion is
noted.

Mediastinum/Nodes: No enlarged mediastinal or axillary lymph nodes.
Thyroid gland, trachea, and esophagus demonstrate no significant
findings.

Lungs/Pleura: A moderate to large left pleural effusion and moderate
right pleural effusion noted. Bilateral lower lung atelectasis
identified, left-greater-than-right. Mild central ground-glass and
interstitial opacities are noted which may represent mild edema.
There is no evidence of focal mass or pneumothorax.

Upper Abdomen: No acute abnormality. A fatty lesion extending off of
the right kidney is stable to minimally increased from 6404.

Musculoskeletal: No acute abnormality or suspicious focal lesion.
IMPRESSION: Bilateral pleural effusions, moderate to large on the left and
moderate on the right with bilateral lower lung atelectasis.

Mild central ground-glass opacities which may represent mild
interstitial edema.

Cardiomegaly and coronary artery disease.

Aortic Atherosclerosis ([id number]).

## 2018-08-13 DIAGNOSIS — Z992 Dependence on renal dialysis: Secondary | ICD-10-CM | POA: Diagnosis not present

## 2018-08-13 DIAGNOSIS — N269 Renal sclerosis, unspecified: Secondary | ICD-10-CM | POA: Diagnosis not present

## 2018-08-13 DIAGNOSIS — N186 End stage renal disease: Secondary | ICD-10-CM | POA: Diagnosis not present

## 2018-08-15 DIAGNOSIS — N186 End stage renal disease: Secondary | ICD-10-CM | POA: Diagnosis not present

## 2018-08-15 DIAGNOSIS — D509 Iron deficiency anemia, unspecified: Secondary | ICD-10-CM | POA: Diagnosis not present

## 2018-08-15 DIAGNOSIS — N2581 Secondary hyperparathyroidism of renal origin: Secondary | ICD-10-CM | POA: Diagnosis not present

## 2018-08-15 DIAGNOSIS — D689 Coagulation defect, unspecified: Secondary | ICD-10-CM | POA: Diagnosis not present

## 2018-08-21 DIAGNOSIS — R0600 Dyspnea, unspecified: Secondary | ICD-10-CM | POA: Diagnosis not present

## 2018-08-21 DIAGNOSIS — J90 Pleural effusion, not elsewhere classified: Secondary | ICD-10-CM | POA: Diagnosis not present

## 2018-08-21 DIAGNOSIS — N186 End stage renal disease: Secondary | ICD-10-CM | POA: Diagnosis not present

## 2018-08-21 DIAGNOSIS — R05 Cough: Secondary | ICD-10-CM | POA: Diagnosis not present

## 2018-08-21 DIAGNOSIS — R0602 Shortness of breath: Secondary | ICD-10-CM | POA: Diagnosis not present

## 2018-08-27 DIAGNOSIS — R0602 Shortness of breath: Secondary | ICD-10-CM | POA: Diagnosis not present

## 2018-08-27 DIAGNOSIS — E782 Mixed hyperlipidemia: Secondary | ICD-10-CM | POA: Diagnosis not present

## 2018-09-13 DIAGNOSIS — N186 End stage renal disease: Secondary | ICD-10-CM | POA: Diagnosis not present

## 2018-09-13 DIAGNOSIS — N269 Renal sclerosis, unspecified: Secondary | ICD-10-CM | POA: Diagnosis not present

## 2018-09-13 DIAGNOSIS — Z992 Dependence on renal dialysis: Secondary | ICD-10-CM | POA: Diagnosis not present

## 2018-09-14 DIAGNOSIS — D631 Anemia in chronic kidney disease: Secondary | ICD-10-CM | POA: Diagnosis not present

## 2018-09-14 DIAGNOSIS — D509 Iron deficiency anemia, unspecified: Secondary | ICD-10-CM | POA: Diagnosis not present

## 2018-09-14 DIAGNOSIS — N2581 Secondary hyperparathyroidism of renal origin: Secondary | ICD-10-CM | POA: Diagnosis not present

## 2018-09-14 DIAGNOSIS — D689 Coagulation defect, unspecified: Secondary | ICD-10-CM | POA: Diagnosis not present

## 2018-09-14 DIAGNOSIS — N186 End stage renal disease: Secondary | ICD-10-CM | POA: Diagnosis not present

## 2018-09-27 DIAGNOSIS — K648 Other hemorrhoids: Secondary | ICD-10-CM | POA: Diagnosis not present

## 2018-09-27 DIAGNOSIS — R0602 Shortness of breath: Secondary | ICD-10-CM | POA: Diagnosis not present

## 2018-09-27 DIAGNOSIS — R195 Other fecal abnormalities: Secondary | ICD-10-CM | POA: Diagnosis not present

## 2018-09-27 DIAGNOSIS — E782 Mixed hyperlipidemia: Secondary | ICD-10-CM | POA: Diagnosis not present

## 2018-10-02 ENCOUNTER — Ambulatory Visit: Payer: PPO | Admitting: Cardiology

## 2018-10-13 DIAGNOSIS — N269 Renal sclerosis, unspecified: Secondary | ICD-10-CM | POA: Diagnosis not present

## 2018-10-13 DIAGNOSIS — Z992 Dependence on renal dialysis: Secondary | ICD-10-CM | POA: Diagnosis not present

## 2018-10-13 DIAGNOSIS — N186 End stage renal disease: Secondary | ICD-10-CM | POA: Diagnosis not present

## 2018-10-15 DIAGNOSIS — D509 Iron deficiency anemia, unspecified: Secondary | ICD-10-CM | POA: Diagnosis not present

## 2018-10-15 DIAGNOSIS — N2581 Secondary hyperparathyroidism of renal origin: Secondary | ICD-10-CM | POA: Diagnosis not present

## 2018-10-15 DIAGNOSIS — N186 End stage renal disease: Secondary | ICD-10-CM | POA: Diagnosis not present

## 2018-10-15 DIAGNOSIS — D631 Anemia in chronic kidney disease: Secondary | ICD-10-CM | POA: Diagnosis not present

## 2018-10-15 DIAGNOSIS — E876 Hypokalemia: Secondary | ICD-10-CM | POA: Diagnosis not present

## 2018-10-17 DIAGNOSIS — K921 Melena: Secondary | ICD-10-CM | POA: Diagnosis not present

## 2018-10-27 DIAGNOSIS — E782 Mixed hyperlipidemia: Secondary | ICD-10-CM | POA: Diagnosis not present

## 2018-10-27 DIAGNOSIS — R0602 Shortness of breath: Secondary | ICD-10-CM | POA: Diagnosis not present

## 2018-11-01 ENCOUNTER — Ambulatory Visit (INDEPENDENT_AMBULATORY_CARE_PROVIDER_SITE_OTHER): Payer: PPO | Admitting: Cardiology

## 2018-11-01 ENCOUNTER — Encounter: Payer: Self-pay | Admitting: Cardiology

## 2018-11-01 VITALS — BP 120/62 | HR 62 | Ht 66.0 in | Wt 120.2 lb

## 2018-11-01 DIAGNOSIS — I10 Essential (primary) hypertension: Secondary | ICD-10-CM

## 2018-11-01 DIAGNOSIS — Z992 Dependence on renal dialysis: Secondary | ICD-10-CM

## 2018-11-01 DIAGNOSIS — I480 Paroxysmal atrial fibrillation: Secondary | ICD-10-CM

## 2018-11-01 DIAGNOSIS — N186 End stage renal disease: Secondary | ICD-10-CM

## 2018-11-01 MED ORDER — NITROGLYCERIN 0.4 MG SL SUBL: 0.4000 mg | SUBLINGUAL_TABLET | SUBLINGUAL | 11 refills | Status: AC | PRN

## 2018-11-01 MED ORDER — CARVEDILOL 25 MG PO TABS: 25.0000 mg | ORAL_TABLET | Freq: Two times a day (BID) | ORAL | 2 refills | Status: AC

## 2018-11-01 NOTE — Patient Instructions (Signed)
Medication Instructions:  Refills for meds have been sent.   If you need a refill on your cardiac medications before your next appointment, please call your pharmacy.   Lab work: None  If you have labs (blood work) drawn today and your tests are completely normal, you will receive your results only by: Marland Kitchen MyChart Message (if you have MyChart) OR . A paper copy in the mail If you have any lab test that is abnormal or we need to change your treatment, we will call you to review the results.  Testing/Procedures: None  Follow-Up: At [facility], you and your health needs are our priority.  As part of our continuing mission to provide you with exceptional heart care, we have created designated Provider Care Teams.  These Care Teams include your primary Cardiologist (physician) and Advanced Practice Providers (APPs -  Physician Assistants and Nurse Practitioners) who all work together to provide you with the care you need, when you need it.  You will need a follow up appointment in 6 months.  Please call our office 2 months in advance to schedule this appointment.  You may see another member of our [employer] Provider Team in [place]: Jenne Campus, MD . Shirlee More, MD  Any Other Special Instructions Will Be Listed Below (If Applicable).

## 2018-11-01 NOTE — Addendum Note (Signed)
Addended by: Mattie Marlin on: 11/01/2018 10:53 AM   Modules accepted: Orders

## 2018-11-01 NOTE — Progress Notes (Signed)
Cardiology Office Note:    Date:  11/01/2018   ID:  Riki Rusk, DOB 07-Mar-1932, MRN [id number]  PCP:  Blane Ohara, MD  Cardiologist:  Garwin Brothers, MD   Referring MD: Blane Ohara, MD    ASSESSMENT:    1. Essential hypertension   2. Paroxysmal atrial fibrillation with rapid ventricular response (HCC)   3. ESRD on dialysis [facility])    PLAN:    In order of problems listed above:  1. Primary prevention stressed with the patient.  Importance of compliance with diet and medication stressed and she vocalized understanding.  Her blood pressure is stable.  Diet was discussed. 2. She is in sinus rhythm clinically today.  She is not on anticoagulation because of anemia in the past from GI bleeding. 3. Patient will be seen in follow-up appointment in 6 months or earlier if the patient has any concerns    Medication Adjustments/Labs and Tests Ordered: Current medicines are reviewed at length with the patient today.  Concerns regarding medicines are outlined above.  No orders of the defined types were placed in this encounter.  No orders of the defined types were placed in this encounter.    No chief complaint on file.    History of Present Illness:    Bobbi Yount is a 84 y.o. female.  Patient has history of essential hypertension, paroxysmal defibrillation.  She is not on anticoagulation because of history of GI bleeding in the past.  She denies any chest pain orthopnea or PND.  She has end-stage renal disease on dialysis.  At the time of my evaluation, the patient is alert awake oriented and in no distress.  Past Medical History:  Diagnosis Date  . Anemia in chronic renal disease    ESRD- Sees Dr Hyman Hopes  . Arthritis   . Benign lipomatous neoplasm of kidney   . Complication of anesthesia 1998   "blood pressure dropped low during surgery and after surgery it went up really high."   . Diverticulitis   . ESRD (end stage renal disease) on dialysis [facility])    "MWF; [place]" (11/08/2017)  . GERD (gastroesophageal reflux disease)   . Heart murmur    PCP- said it is nothing to be concerned  . HOH (hard of hearing)   . Hyperkalemia   . Hyperphosphatemia   . Hypertension   . Hypertensive kidney disease   . Hypothyroidism   . Nail-patella syndrome   . PONV (postoperative nausea and vomiting)   . Proteinuria   . Shortness of breath dyspnea     Past Surgical History:  Procedure Laterality Date  . ABDOMINAL HYSTERECTOMY    . APPENDECTOMY    . AV FISTULA PLACEMENT Right 08/10/2015   Procedure: Right Arm Arteriovenous Brachio-cephalic Fistula ;  Surgeon: Fransisco Hertz, MD;  Location[facility] OR;  Service: Vascular;  Laterality: Right;  . BASCILIC VEIN TRANSPOSITION Right 12/15/2015   Procedure: SECOND STAGE Right Arm BRACHIAL VEIN TRANSPOSITION;  Surgeon: Fransisco Hertz, MD;  Location: [facility] OR;  Service: Vascular;  Laterality: Right;  . CATARACT EXTRACTION W/ INTRAOCULAR LENS  IMPLANT, BILATERAL    . CHOLECYSTECTOMY  1998  . COLONOSCOPY    . ENTEROSCOPY N/A 04/17/2018   Procedure: ENTEROSCOPY;  Surgeon: Charlott Rakes, MD;  Location: [facility] ENDOSCOPY;  Service: Endoscopy;  Laterality: N/A;  . ESOPHAGOGASTRODUODENOSCOPY (EGD) WITH PROPOFOL N/A 04/14/2018   Procedure: ESOPHAGOGASTRODUODENOSCOPY (EGD) WITH PROPOFOL;  Surgeon: Bernette Redbird, MD;  Location: [facility] ENDOSCOPY;  Service: Endoscopy;  Laterality: N/A;  . FISTULA SUPERFICIALIZATION Right 08/10/2015   Procedure: Right First Stage Brachial Transposition;  Surgeon: Conrad Deer Creek, MD;  Location: [place];  Service: Vascular;  Laterality: Right;  . GIVENS CAPSULE STUDY N/A 04/14/2018   Procedure: GIVENS CAPSULE STUDY;  Surgeon: Ronald Lobo, MD;  Location: [facility] ENDOSCOPY;  Service: Endoscopy;  Laterality: N/A;  . HOT HEMOSTASIS N/A 04/17/2018   Procedure: HOT HEMOSTASIS (ARGON PLASMA COAGULATION/BICAP);  Surgeon: Wilford Corner, MD;  Location: [place];  Service: Endoscopy;  Laterality: N/A;  . INSERTION OF  DIALYSIS CATHETER N/A 08/10/2015   Procedure: INSERTION OF Right Internal Jugular DIALYSIS CATHETER;  Surgeon: Conrad Tishomingo, MD;  Location: [place];  Service: Vascular;  Laterality: N/A;  . IR THORACENTESIS ASP PLEURAL SPACE W/IMG GUIDE  05/11/2017  . TONSILLECTOMY      Current Medications: Current Meds  Medication Sig  . acetaminophen (TYLENOL) 500 MG tablet Take 500 mg by mouth every 8 (eight) hours as needed for mild pain.  Marland Kitchen aspirin EC 81 MG tablet Take 81 mg by mouth daily.  . calcium acetate (PHOSLO) 667 MG capsule Take 1,334 mg by mouth 3 (three) times daily.   . carvedilol (COREG) 6.25 MG tablet Take 1 tablet (6.25 mg total) by mouth 2 (two) times daily with a meal.  . Chlorpheniramine-Acetaminophen (CORICIDIN HBP COLD/FLU PO) Take 1 tablet by mouth as needed (cold symptoms).  . docusate sodium (COLACE) 100 MG capsule Take 1 capsule (100 mg total) by mouth 2 (two) times daily as needed for mild constipation.  . fluticasone (FLONASE) 50 MCG/ACT nasal spray Place 1 spray into both nostrils as needed for allergies or rhinitis.  Marland Kitchen levothyroxine (SYNTHROID, LEVOTHROID) 150 MCG tablet Take 150 mcg by mouth daily.   . Lidocaine-Prilocaine, Bulk, 2.5-2.5 % CREA Apply 1 application topically as needed (before dialysis).  Marland Kitchen NITROSTAT 0.4 MG SL tablet Place 0.4 mg under the tongue every 5 (five) minutes as needed for chest pain.   Marland Kitchen omeprazole (PRILOSEC) 40 MG capsule Take 40 mg by mouth daily.  Marland Kitchen OVER THE COUNTER MEDICATION Place 1 drop into both eyes daily as needed (allergy symptoms). Allergy Relief eye drops  . polyethylene glycol (MIRALAX / GLYCOLAX) packet Take 17 g by mouth daily as needed. (Patient taking differently: Take 17 g by mouth daily as needed for mild constipation. )  . Probiotic Product (DIGESTIVE ADVANTAGE) CAPS Take 1 capsule by mouth daily.  . Probiotic Product (PROBIOTIC-10 PO) Take 1 capsule by mouth daily.  . raloxifene (EVISTA) 60 MG tablet Take 60 mg by mouth daily.      Allergies:   Codeine and Novocain [procaine]   Social History   Socioeconomic History  . Marital status: Married    Spouse name: Not on file  . Number of children: Not on file  . Years of education: Not on file  . Highest education level: Not on file  Occupational History  . Not on file  Social Needs  . Financial resource strain: Not on file  . Food insecurity:    Worry: Not on file    Inability: Not on file  . Transportation needs:    Medical: Not on file    Non-medical: Not on file  Tobacco Use  . Smoking status: Never Smoker  . Smokeless tobacco: Never Used  Substance and Sexual Activity  . Alcohol use: No    Alcohol/week: 0.0 standard drinks  . Drug use: No  . Sexual activity: Not on file  Lifestyle  .  Physical activity:    Days per week: Not on file    Minutes per session: Not on file  . Stress: Not on file  Relationships  . Social connections:    Talks on phone: Not on file    Gets together: Not on file    Attends religious service: Not on file    Active member of club or organization: Not on file    Attends meetings of clubs or organizations: Not on file    Relationship status: Not on file  Other Topics Concern  . Not on file  Social History Narrative  . Not on file     Family History: The patient's family history includes Heart attack in her father; Heart disease in her father; Hyperlipidemia in her father; Hypertension in her father and mother.  ROS:   Please see the history of present illness.    All other systems reviewed and are negative.  EKGs/Labs/Other Studies Reviewed:    The following studies were reviewed today: I discussed my findings with the patient at extensive length.   Recent Labs: 11/08/2017: B Natriuretic Peptide 2,422.4 04/13/2018: ALT 16 04/17/2018: Magnesium 1.9; TSH 21.068 04/18/2018: BUN 40; Creatinine, Ser 7.54; Hemoglobin 9.4; Platelets 170; Potassium 3.7; Sodium 133  Recent Lipid Panel No results found for: CHOL, TRIG,  HDL, CHOLHDL, VLDL, LDLCALC, LDLDIRECT  Physical Exam:    VS:  BP 120/62 (BP Location: Right Arm, Patient Position: Sitting, Cuff Size: Normal)   Pulse 62   Ht $R'5\' 6"'fL$  (1.676 m)   Wt 120 lb 3.2 oz (54.5 kg)   SpO2 93%   BMI 19.40 kg/m     Wt Readings from Last 3 Encounters:  11/01/18 120 lb 3.2 oz (54.5 kg)  05/03/18 128 lb (58.1 kg)  04/18/18 123 lb 0.3 oz (55.8 kg)     GEN: Patient is in no acute distress HEENT: Normal NECK: No JVD; No carotid bruits LYMPHATICS: No lymphadenopathy CARDIAC: Hear sounds regular, 2/6 systolic murmur at the apex. RESPIRATORY:  Clear to auscultation without rales, wheezing or rhonchi  ABDOMEN: Soft, non-tender, non-distended MUSCULOSKELETAL:  No edema; No deformity  SKIN: Warm and dry NEUROLOGIC:  Alert and oriented x 3 PSYCHIATRIC:  Normal affect   Signed, Jenean Lindau, MD  11/01/2018 10:18 AM    [facility]

## 2018-11-13 DIAGNOSIS — Z992 Dependence on renal dialysis: Secondary | ICD-10-CM | POA: Diagnosis not present

## 2018-11-13 DIAGNOSIS — N269 Renal sclerosis, unspecified: Secondary | ICD-10-CM | POA: Diagnosis not present

## 2018-11-13 DIAGNOSIS — N186 End stage renal disease: Secondary | ICD-10-CM | POA: Diagnosis not present

## 2018-11-16 DIAGNOSIS — D509 Iron deficiency anemia, unspecified: Secondary | ICD-10-CM | POA: Diagnosis not present

## 2018-11-16 DIAGNOSIS — E876 Hypokalemia: Secondary | ICD-10-CM | POA: Diagnosis not present

## 2018-11-16 DIAGNOSIS — D631 Anemia in chronic kidney disease: Secondary | ICD-10-CM | POA: Diagnosis not present

## 2018-11-16 DIAGNOSIS — N186 End stage renal disease: Secondary | ICD-10-CM | POA: Diagnosis not present

## 2018-11-16 DIAGNOSIS — D689 Coagulation defect, unspecified: Secondary | ICD-10-CM | POA: Diagnosis not present

## 2018-11-16 DIAGNOSIS — N2581 Secondary hyperparathyroidism of renal origin: Secondary | ICD-10-CM | POA: Diagnosis not present

## 2018-11-16 DIAGNOSIS — R197 Diarrhea, unspecified: Secondary | ICD-10-CM | POA: Diagnosis not present

## 2018-11-20 DIAGNOSIS — N186 End stage renal disease: Secondary | ICD-10-CM | POA: Diagnosis not present

## 2018-11-20 DIAGNOSIS — E8770 Fluid overload, unspecified: Secondary | ICD-10-CM | POA: Diagnosis not present

## 2018-11-20 DIAGNOSIS — R0602 Shortness of breath: Secondary | ICD-10-CM | POA: Diagnosis not present

## 2018-11-20 DIAGNOSIS — N2581 Secondary hyperparathyroidism of renal origin: Secondary | ICD-10-CM | POA: Diagnosis not present

## 2018-11-22 DIAGNOSIS — I871 Compression of vein: Secondary | ICD-10-CM | POA: Diagnosis not present

## 2018-11-22 DIAGNOSIS — N186 End stage renal disease: Secondary | ICD-10-CM | POA: Diagnosis not present

## 2018-11-22 DIAGNOSIS — Z992 Dependence on renal dialysis: Secondary | ICD-10-CM | POA: Diagnosis not present

## 2018-11-27 DIAGNOSIS — E782 Mixed hyperlipidemia: Secondary | ICD-10-CM | POA: Diagnosis not present

## 2018-11-27 DIAGNOSIS — K219 Gastro-esophageal reflux disease without esophagitis: Secondary | ICD-10-CM | POA: Diagnosis not present

## 2018-11-27 DIAGNOSIS — I132 Hypertensive heart and chronic kidney disease with heart failure and with stage 5 chronic kidney disease, or end stage renal disease: Secondary | ICD-10-CM | POA: Diagnosis not present

## 2018-11-27 DIAGNOSIS — Z992 Dependence on renal dialysis: Secondary | ICD-10-CM | POA: Diagnosis not present

## 2018-11-27 DIAGNOSIS — R0602 Shortness of breath: Secondary | ICD-10-CM | POA: Diagnosis not present

## 2018-11-27 DIAGNOSIS — D631 Anemia in chronic kidney disease: Secondary | ICD-10-CM | POA: Diagnosis not present

## 2018-11-27 DIAGNOSIS — Z95828 Presence of other vascular implants and grafts: Secondary | ICD-10-CM | POA: Diagnosis not present

## 2018-11-27 DIAGNOSIS — R531 Weakness: Secondary | ICD-10-CM | POA: Diagnosis not present

## 2018-11-27 DIAGNOSIS — N186 End stage renal disease: Secondary | ICD-10-CM | POA: Diagnosis not present

## 2018-11-27 DIAGNOSIS — D508 Other iron deficiency anemias: Secondary | ICD-10-CM | POA: Diagnosis not present

## 2018-11-27 DIAGNOSIS — R5383 Other fatigue: Secondary | ICD-10-CM | POA: Diagnosis not present

## 2018-12-14 DIAGNOSIS — Z992 Dependence on renal dialysis: Secondary | ICD-10-CM | POA: Diagnosis not present

## 2018-12-14 DIAGNOSIS — N186 End stage renal disease: Secondary | ICD-10-CM | POA: Diagnosis not present

## 2018-12-14 DIAGNOSIS — N269 Renal sclerosis, unspecified: Secondary | ICD-10-CM | POA: Diagnosis not present

## 2018-12-17 DIAGNOSIS — N186 End stage renal disease: Secondary | ICD-10-CM | POA: Diagnosis not present

## 2018-12-17 DIAGNOSIS — E876 Hypokalemia: Secondary | ICD-10-CM | POA: Diagnosis not present

## 2018-12-17 DIAGNOSIS — N2581 Secondary hyperparathyroidism of renal origin: Secondary | ICD-10-CM | POA: Diagnosis not present

## 2018-12-17 DIAGNOSIS — E875 Hyperkalemia: Secondary | ICD-10-CM | POA: Diagnosis not present

## 2018-12-17 DIAGNOSIS — D689 Coagulation defect, unspecified: Secondary | ICD-10-CM | POA: Diagnosis not present

## 2018-12-17 DIAGNOSIS — D631 Anemia in chronic kidney disease: Secondary | ICD-10-CM | POA: Diagnosis not present

## 2018-12-28 DIAGNOSIS — R0602 Shortness of breath: Secondary | ICD-10-CM | POA: Diagnosis not present

## 2018-12-28 DIAGNOSIS — E782 Mixed hyperlipidemia: Secondary | ICD-10-CM | POA: Diagnosis not present

## 2019-01-02 ENCOUNTER — Inpatient Hospital Stay (HOSPITAL_COMMUNITY)
Admission: AD | Admit: 2019-01-02 | Discharge: 2019-01-04 | DRG: 064 | Disposition: A | Discharge status: Home Health | Payer: PPO | Source: Other Acute Inpatient Hospital | Attending: Family Medicine | Admitting: Family Medicine

## 2019-01-02 ENCOUNTER — Encounter: Payer: Self-pay | Admitting: Internal Medicine

## 2019-01-02 DIAGNOSIS — K219 Gastro-esophageal reflux disease without esophagitis: Secondary | ICD-10-CM | POA: Diagnosis present

## 2019-01-02 DIAGNOSIS — Z79890 Hormone replacement therapy (postmenopausal): Secondary | ICD-10-CM

## 2019-01-02 DIAGNOSIS — I361 Nonrheumatic tricuspid (valve) insufficiency: Secondary | ICD-10-CM | POA: Diagnosis not present

## 2019-01-02 DIAGNOSIS — I120 Hypertensive chronic kidney disease with stage 5 chronic kidney disease or end stage renal disease: Secondary | ICD-10-CM | POA: Diagnosis present

## 2019-01-02 DIAGNOSIS — N186 End stage renal disease: Secondary | ICD-10-CM

## 2019-01-02 DIAGNOSIS — I639 Cerebral infarction, unspecified: Principal | ICD-10-CM | POA: Diagnosis present

## 2019-01-02 DIAGNOSIS — E039 Hypothyroidism, unspecified: Secondary | ICD-10-CM | POA: Diagnosis present

## 2019-01-02 DIAGNOSIS — G8311 Monoplegia of lower limb affecting right dominant side: Secondary | ICD-10-CM | POA: Diagnosis not present

## 2019-01-02 DIAGNOSIS — E785 Hyperlipidemia, unspecified: Secondary | ICD-10-CM | POA: Diagnosis present

## 2019-01-02 DIAGNOSIS — Z90710 Acquired absence of both cervix and uterus: Secondary | ICD-10-CM | POA: Diagnosis not present

## 2019-01-02 DIAGNOSIS — D649 Anemia, unspecified: Secondary | ICD-10-CM | POA: Diagnosis present

## 2019-01-02 DIAGNOSIS — Z888 Allergy status to other drugs, medicaments and biological substances status: Secondary | ICD-10-CM | POA: Diagnosis not present

## 2019-01-02 DIAGNOSIS — Z79899 Other long term (current) drug therapy: Secondary | ICD-10-CM

## 2019-01-02 DIAGNOSIS — Z7982 Long term (current) use of aspirin: Secondary | ICD-10-CM

## 2019-01-02 DIAGNOSIS — R531 Weakness: Secondary | ICD-10-CM | POA: Diagnosis not present

## 2019-01-02 DIAGNOSIS — J9811 Atelectasis: Secondary | ICD-10-CM | POA: Diagnosis present

## 2019-01-02 DIAGNOSIS — N2581 Secondary hyperparathyroidism of renal origin: Secondary | ICD-10-CM | POA: Diagnosis present

## 2019-01-02 DIAGNOSIS — I129 Hypertensive chronic kidney disease with stage 1 through stage 4 chronic kidney disease, or unspecified chronic kidney disease: Secondary | ICD-10-CM | POA: Diagnosis not present

## 2019-01-02 DIAGNOSIS — D631 Anemia in chronic kidney disease: Secondary | ICD-10-CM | POA: Diagnosis not present

## 2019-01-02 DIAGNOSIS — Z885 Allergy status to narcotic agent status: Secondary | ICD-10-CM | POA: Diagnosis not present

## 2019-01-02 DIAGNOSIS — J449 Chronic obstructive pulmonary disease, unspecified: Secondary | ICD-10-CM | POA: Diagnosis not present

## 2019-01-02 DIAGNOSIS — E875 Hyperkalemia: Secondary | ICD-10-CM | POA: Diagnosis present

## 2019-01-02 DIAGNOSIS — H9190 Unspecified hearing loss, unspecified ear: Secondary | ICD-10-CM | POA: Diagnosis not present

## 2019-01-02 DIAGNOSIS — R29701 NIHSS score 1: Secondary | ICD-10-CM | POA: Diagnosis not present

## 2019-01-02 DIAGNOSIS — Z992 Dependence on renal dialysis: Secondary | ICD-10-CM | POA: Diagnosis not present

## 2019-01-02 DIAGNOSIS — Z8249 Family history of ischemic heart disease and other diseases of the circulatory system: Secondary | ICD-10-CM | POA: Diagnosis not present

## 2019-01-02 DIAGNOSIS — J90 Pleural effusion, not elsewhere classified: Secondary | ICD-10-CM | POA: Diagnosis present

## 2019-01-02 DIAGNOSIS — Q872 Congenital malformation syndromes predominantly involving limbs: Secondary | ICD-10-CM | POA: Diagnosis not present

## 2019-01-02 DIAGNOSIS — Z961 Presence of intraocular lens: Secondary | ICD-10-CM | POA: Diagnosis present

## 2019-01-02 DIAGNOSIS — Z9842 Cataract extraction status, left eye: Secondary | ICD-10-CM

## 2019-01-02 DIAGNOSIS — I480 Paroxysmal atrial fibrillation: Secondary | ICD-10-CM | POA: Diagnosis present

## 2019-01-02 DIAGNOSIS — Z8349 Family history of other endocrine, nutritional and metabolic diseases: Secondary | ICD-10-CM | POA: Diagnosis not present

## 2019-01-02 DIAGNOSIS — Z9841 Cataract extraction status, right eye: Secondary | ICD-10-CM

## 2019-01-02 DIAGNOSIS — Z9049 Acquired absence of other specified parts of digestive tract: Secondary | ICD-10-CM

## 2019-01-02 DIAGNOSIS — N189 Chronic kidney disease, unspecified: Secondary | ICD-10-CM | POA: Diagnosis not present

## 2019-01-02 DIAGNOSIS — I739 Peripheral vascular disease, unspecified: Secondary | ICD-10-CM | POA: Diagnosis present

## 2019-01-02 DIAGNOSIS — I10 Essential (primary) hypertension: Secondary | ICD-10-CM | POA: Diagnosis present

## 2019-01-02 DIAGNOSIS — Z7951 Long term (current) use of inhaled steroids: Secondary | ICD-10-CM

## 2019-01-02 MED ORDER — HYDRALAZINE HCL 20 MG/ML IJ SOLN: 5.0000 mg | INTRAMUSCULAR | Status: DC | PRN

## 2019-01-02 MED ORDER — SIMETHICONE 80 MG PO CHEW
80.0000 mg | CHEWABLE_TABLET | Freq: Four times a day (QID) | ORAL | Status: DC | PRN
  Administered 2019-01-02: 80 mg via ORAL
  Filled 2019-01-02: qty 1

## 2019-01-02 MED ORDER — ACETAMINOPHEN 325 MG PO TABS
650.0000 mg | ORAL_TABLET | ORAL | Status: DC | PRN
  Administered 2019-01-03: 650 mg via ORAL
  Filled 2019-01-02: qty 2

## 2019-01-02 MED ORDER — ACETAMINOPHEN 160 MG/5ML PO SOLN: 650.0000 mg | ORAL | Status: DC | PRN

## 2019-01-02 MED ORDER — ASPIRIN 325 MG PO TABS
325.0000 mg | ORAL_TABLET | Freq: Every day | ORAL | Status: DC
  Administered 2019-01-03: 325 mg via ORAL
  Filled 2019-01-02: qty 1

## 2019-01-02 MED ORDER — STROKE: EARLY STAGES OF RECOVERY BOOK
Freq: Once | Status: AC
  Administered 2019-01-02
  Filled 2019-01-02: qty 1

## 2019-01-02 MED ORDER — ACETAMINOPHEN 650 MG RE SUPP: 650.0000 mg | RECTAL | Status: DC | PRN

## 2019-01-02 MED ORDER — HEPARIN SODIUM (PORCINE) 5000 UNIT/ML IJ SOLN
5000.0000 [IU] | Freq: Three times a day (TID) | INTRAMUSCULAR | Status: DC
  Administered 2019-01-02 – 2019-01-04 (×5): 5000 [IU] via SUBCUTANEOUS
  Filled 2019-01-02 (×5): qty 1

## 2019-01-02 MED ORDER — SENNOSIDES-DOCUSATE SODIUM 8.6-50 MG PO TABS: 1.0000 | ORAL_TABLET | Freq: Every evening | ORAL | Status: DC | PRN

## 2019-01-02 NOTE — Progress Notes (Signed)
Patient arrived in the unit at 2022 pm, alert and oriented x4, called at admission placement, MD is awared , and will closely monitor.

## 2019-01-02 NOTE — H&P (Signed)
History and Physical    Alexis Washington XBJ:[id number] DOB: 08/06/1932 DOA: 01/02/2019  PCP: Rochel Brome, MD Patient coming from: [facility] ED  Chief Complaint: Right leg weakness  HPI: Alexis Washington is a 85 y.o. female with medical history significant of ESRD on hemodialysis MWF, paroxysmal atrial fibrillation, hypertension, hypothyroidism, GERD presenting as a transfer from [facility] ED for further management of ischemic stroke.  Patient states when she woke up in the morning on 2/19 her " right foot was glued to the floor."  She had trouble moving her right leg and walking.  Reports having problems with balance and walking from the day prior.  Denies having any headache, slurring of speech, numbness, or tingling.  Family did not notice any drooping of her face.  Denies prior history of stroke.  Denies history of smoking.  States her last dialysis was 2/17.  She takes a baby aspirin daily.  ED Course:   Head CT without contrast reading per radiologist: "No acute intracranial pathology.  No noncontrast evidence of acute stroke or hemorrhage.  Extensive periventricular white matter hypodensity, increased compared to remote prior examination on dated 2010.  Consider MRI to more specifically evaluate for acute diffusion restriction infarction if suspected."  Head MRI without contrast reading per radiologist: "1.  Acute 13 mm left frontal white matter small vessel infarct. 2.  Moderate to severe chronic small vessel ischemic changes."  Chest x-ray reading per radiologist: "COPD changes with small left pleural effusion and left basilar atelectasis."  Per Dr. Graciela Husbands conversation with the ED provider, patient received full dose aspirin at [facility] ED.  Review of Systems: As per HPI otherwise 10 point review of systems negative.  Past Medical History:  Diagnosis Date  . Anemia in chronic renal disease    ESRD- Sees Dr Justin Mend  . Arthritis   . Benign lipomatous neoplasm of kidney   .  Complication of anesthesia 1998   "blood pressure dropped low during surgery and after surgery it went up really high."   . Diverticulitis   . ESRD (end stage renal disease) on dialysis [facility])    "MWF; [place]" (11/08/2017)  . GERD (gastroesophageal reflux disease)   . Heart murmur    PCP- said it is nothing to be concerned  . HOH (hard of hearing)   . Hyperkalemia   . Hyperphosphatemia   . Hypertension   . Hypertensive kidney disease   . Hypothyroidism   . Nail-patella syndrome   . PONV (postoperative nausea and vomiting)   . Proteinuria   . Shortness of breath dyspnea     Past Surgical History:  Procedure Laterality Date  . ABDOMINAL HYSTERECTOMY    . APPENDECTOMY    . AV FISTULA PLACEMENT Right 08/10/2015   Procedure: Right Arm Arteriovenous Brachio-cephalic Fistula ;  Surgeon: Conrad Dundee, MD;  Location: [place];  Service: Vascular;  Laterality: Right;  . BASCILIC VEIN TRANSPOSITION Right 12/15/2015   Procedure: SECOND STAGE Right Arm BRACHIAL VEIN TRANSPOSITION;  Surgeon: Conrad Bern, MD;  Location: [place];  Service: Vascular;  Laterality: Right;  . CATARACT EXTRACTION W/ INTRAOCULAR LENS  IMPLANT, BILATERAL    . CHOLECYSTECTOMY  1998  . COLONOSCOPY    . ENTEROSCOPY N/A 04/17/2018   Procedure: ENTEROSCOPY;  Surgeon: Wilford Corner, MD;  [facility]COPY;  Service: Endoscopy;  Laterality: N/A;  . ESOPHAGOGASTRODUODENOSCOPY (EGD) WITH PROPOFOL N/A 04/14/2018   Procedure: ESOPHAGOGASTRODUODENOSCOPY (EGD) WITH PROPOFOL;  Surgeon: Ronald Lobo, MD;  Location: [place];  Service: Endoscopy;  Laterality: N/A;  . FISTULA SUPERFICIALIZATION Right 08/10/2015   Procedure: Right First Stage Brachial Transposition;  Surgeon: Fransisco Hertz, MD;  Location: [facility] OR;  Service: Vascular;  Laterality: Right;  . GIVENS CAPSULE STUDY N/A 04/14/2018   Procedure: GIVENS CAPSULE STUDY;  Surgeon: Bernette Redbird, MD;  Location: [facility] ENDOSCOPY;  Service: Endoscopy;  Laterality: N/A;    . HOT HEMOSTASIS N/A 04/17/2018   Procedure: HOT HEMOSTASIS (ARGON PLASMA COAGULATION/BICAP);  Surgeon: Charlott Rakes, MD;  Location: [facility] ENDOSCOPY;  Service: Endoscopy;  Laterality: N/A;  . INSERTION OF DIALYSIS CATHETER N/A 08/10/2015   Procedure: INSERTION OF Right Internal Jugular DIALYSIS CATHETER;  Surgeon: Fransisco Hertz, MD;  Location: [facility] OR;  Service: Vascular;  Laterality: N/A;  . IR THORACENTESIS ASP PLEURAL SPACE W/IMG GUIDE  05/11/2017  . TONSILLECTOMY       reports that she has never smoked. She has never used smokeless tobacco. She reports that she does not drink alcohol or use drugs.  Allergies  Allergen Reactions  . Codeine Nausea And Vomiting  . Novocain [Procaine] Palpitations    Family History  Problem Relation Age of Onset  . Hypertension Mother   . Heart disease Father        before age 53  . Hyperlipidemia Father   . Hypertension Father   . Heart attack Father     Prior to Admission medications   Medication Sig Start Date End Date Taking? Authorizing Provider  acetaminophen (TYLENOL) 500 MG tablet Take 500 mg by mouth every 8 (eight) hours as needed for mild pain.    [provider]  aspirin EC 81 MG tablet Take 81 mg by mouth daily.    [provider]  calcium acetate (PHOSLO) 667 MG capsule Take 1,334 mg by mouth 3 (three) times daily.  04/21/17   [provider]  carvedilol (COREG) 25 MG tablet Take 1 tablet (25 mg total) by mouth 2 (two) times daily. 11/01/18 01/30/19  Revankar, Aundra Dubin, MD  Chlorpheniramine-Acetaminophen (CORICIDIN HBP COLD/FLU PO) Take 1 tablet by mouth as needed (cold symptoms).    [provider]  docusate sodium (COLACE) 100 MG capsule Take 1 capsule (100 mg total) by mouth 2 (two) times daily as needed for mild constipation. 05/12/17   Amin, Loura Halt, MD  fluticasone (FLONASE) 50 MCG/ACT nasal spray Place 1 spray into both nostrils as needed for allergies or rhinitis.    [provider]   levothyroxine (SYNTHROID, LEVOTHROID) 150 MCG tablet Take 150 mcg by mouth daily.  08/28/17   [provider]  Lidocaine-Prilocaine, Bulk, 2.5-2.5 % CREA Apply 1 application topically as needed (before dialysis).    [provider]  nitroGLYCERIN (NITROSTAT) 0.4 MG SL tablet Place 1 tablet (0.4 mg total) under the tongue every 5 (five) minutes as needed. 11/01/18 01/30/19  Revankar, Aundra Dubin, MD  omeprazole (PRILOSEC) 40 MG capsule Take 40 mg by mouth daily.    [provider]  OVER THE COUNTER MEDICATION Place 1 drop into both eyes daily as needed (allergy symptoms). Allergy Relief eye drops    [provider]  pantoprazole (PROTONIX) 40 MG tablet Take 1 tablet (40 mg total) by mouth 2 (two) times daily. Patient not taking: Reported on 11/01/2018 04/18/18   Beola Cord, MD  polyethylene glycol [facility] Ethelene Hal) packet Take 17 g by mouth daily as needed. Patient taking differently: Take 17 g by mouth daily as needed for mild constipation.  05/12/17   Amin, Ankit  Chirag, MD  Probiotic Product (DIGESTIVE ADVANTAGE) CAPS Take 1 capsule by mouth daily.    [provider]  Probiotic Product (PROBIOTIC-10 PO) Take 1 capsule by mouth daily.    [provider]  raloxifene (EVISTA) 60 MG tablet Take 60 mg by mouth daily. 04/24/17   [provider]    Physical Exam: Vitals:   01/02/19 2032 01/02/19 2232 01/03/19 0032 01/03/19 0232  BP: (!) 179/68 (!) 165/71 (!) 151/63 (!) 158/68  Pulse: 78 84 73 75  Resp: $Remo'16 18 16 18  'hKUiD$ Temp: 98.8 F (37.1 C) 98 F (36.7 C) 97.7 F (36.5 C) 98.5 F (36.9 C)  TempSrc: Oral Axillary Oral Oral  SpO2: 96% 95% 95% 94%    Physical Exam  Constitutional: She is oriented to person, place, and time. She appears well-developed and well-nourished. No distress.  HENT:  Head: Normocephalic.  Mouth/Throat: Oropharynx is clear and moist.  Eyes: Pupils are equal, round, and reactive to light. EOM are normal.  Right eye exhibits no discharge. Left eye exhibits no discharge.  Neck: Neck supple.  Cardiovascular: Normal rate, regular rhythm and intact distal pulses.  Pulmonary/Chest: Effort normal and breath sounds normal. No respiratory distress. She has no wheezes. She has no rales.  Abdominal: Soft. Bowel sounds are normal. She exhibits no distension. There is no abdominal tenderness. There is no guarding.  Musculoskeletal:        General: No edema.  Neurological: She is alert and oriented to person, place, and time.  Speech fluent.  Very mild right-sided facial droop. Strength 5 out of 5 in bilateral upper extremities.   Left lower extremity: Strength 5 out of 5. Right lower extremity: Weakness with dorsiflexion of right foot against resistance.  Strength 5 out of 5 with hip flexion and extension.  Strength 5 out of 5 with knee flexion and extension.  Skin: Skin is warm and dry. She is not diaphoretic.  Psychiatric: She has a normal mood and affect. Her behavior is normal.     Labs on Admission: I have personally reviewed following labs and imaging studies  CBC: Recent Labs  Lab 01/02/19 2236  WBC 5.6  HGB 11.8*  HCT 37.5  MCV 91.2  PLT 025*   Basic Metabolic Panel: Recent Labs  Lab 01/02/19 2236  NA 136  K 4.6  CL 97*  CO2 22  GLUCOSE 111*  BUN 68*  CREATININE 8.45*  CALCIUM 7.5*   GFR: CrCl cannot be calculated (Unknown ideal weight.). Liver Function Tests: Recent Labs  Lab 01/02/19 2236  AST 20  ALT 20  ALKPHOS 51  BILITOT 0.7  PROT 5.6*  ALBUMIN 3.1*   No results for input(s): LIPASE, AMYLASE in the last 168 hours. No results for input(s): AMMONIA in the last 168 hours. Coagulation Profile: Recent Labs  Lab 01/02/19 2236  INR 1.27   Cardiac Enzymes: Recent Labs  Lab 01/02/19 2236  TROPONINI <0.03   BNP (last 3 results) No results for input(s): PROBNP in the last 8760 hours. HbA1C: No results for input(s): HGBA1C in the last 72 hours. CBG: No  results for input(s): GLUCAP in the last 168 hours. Lipid Profile: No results for input(s): CHOL, HDL, LDLCALC, TRIG, CHOLHDL, LDLDIRECT in the last 72 hours. Thyroid Function Tests: No results for input(s): TSH, T4TOTAL, FREET4, T3FREE, THYROIDAB in the last 72 hours. Anemia Panel: No results for input(s): VITAMINB12, FOLATE, FERRITIN, TIBC, IRON, RETICCTPCT in the last 72 hours. Urine analysis: No results found for: COLORURINE, APPEARANCEUR, [place],  PHURINE, GLUCOSEU, HGBUR, BILIRUBINUR, KETONESUR, PROTEINUR, UROBILINOGEN, NITRITE, LEUKOCYTESUR  Radiological Exams on Admission: No results found.  EKG: Pending at this time.  Assessment/Plan Principal Problem:   Acute CVA (cerebrovascular accident) [facility]) Active Problems:   ESRD on dialysis ([place])   Hypertension   Anemia   AF (paroxysmal atrial fibrillation) ([place])  Acute CVA -Presenting with right lower extremity weakness; symptom onset 2/19 morning.  Presented to the ED outside TPA window. -MRI done at [facility] showing acute 13 mm left frontal white matter small vessel infarct. -Received full dose aspirin at [facility] ED.  Continue aspirin 325 mg daily starting in the morning. -Start high intensity statin: Lipitor 40 mg daily -A1c, lipid panel -Cardiac monitoring -Echo -Carotid Dopplers -Risk factor modification -Allow permissive hypertension up to 220/120 -PT/OT/SLP -Neurology has been consulted; appreciate recs  ESRD on HD MWF -Last dialysis session was 2/17.  Potassium 4.6.  Bicarb 22. -Chest [facility] done at [facility] with small left pleural effusion.  Does not appear grossly volume overloaded on exam. -Consult nephrology in the morning  Chronic normocytic anemia -Likely related to end-stage renal disease.  Hemoglobin 11.8, stable.  Hypertension -Allow permissive hypertension up to 220/120 in the setting of acute CVA.  Paroxysmal atrial fibrillation -CHA2DS2Vasc 6.  Per cardiology documentation, not on anticoagulation  due to history of anemia in the past from GI bleed.  Currently rate controlled.  Hold home Coreg at this time to allow permissive hypertension.  Unable to safely order home medications at this time as pharmacy medication reconciliation is pending.  DVT prophylaxis: Subcutaneous heparin Code Status: Patient wishes to be full code. Family Communication: Daughter and husband at bedside. Disposition Plan: Anticipate discharge in 1 to 2 days. Consults called: Neurology (Dr. Cheral Marker) Admission status: Observation, telemetry   Shela Leff MD [facility] Pager [phone]  If 7PM-7AM, please contact night-coverage [email or web address] Password [facility]  01/03/2019, 4:24 AM

## 2019-01-03 ENCOUNTER — Observation Stay (HOSPITAL_COMMUNITY): Payer: PPO

## 2019-01-03 ENCOUNTER — Observation Stay (HOSPITAL_BASED_OUTPATIENT_CLINIC_OR_DEPARTMENT_OTHER): Payer: PPO

## 2019-01-03 DIAGNOSIS — I639 Cerebral infarction, unspecified: Secondary | ICD-10-CM

## 2019-01-03 DIAGNOSIS — I361 Nonrheumatic tricuspid (valve) insufficiency: Secondary | ICD-10-CM

## 2019-01-03 DIAGNOSIS — N186 End stage renal disease: Secondary | ICD-10-CM

## 2019-01-03 DIAGNOSIS — Z992 Dependence on renal dialysis: Secondary | ICD-10-CM

## 2019-01-03 DIAGNOSIS — I10 Essential (primary) hypertension: Secondary | ICD-10-CM

## 2019-01-03 DIAGNOSIS — I480 Paroxysmal atrial fibrillation: Secondary | ICD-10-CM

## 2019-01-03 LAB — LIPID PANEL
CHOL/HDL RATIO: 3.1 ratio
Cholesterol: 155 mg/dL (ref 0–200)
HDL: 50 mg/dL (ref >40)
LDL Cholesterol: 90 mg/dL (ref 0–99)
Triglycerides: 74 mg/dL (ref <150)
VLDL: 15 mg/dL (ref 0–40)

## 2019-01-03 LAB — RENAL FUNCTION PANEL
Albumin: 3.1 g/dL — ABNORMAL LOW (ref 3.5–5.0)
Anion gap: 16 — ABNORMAL HIGH (ref 5–15)
BUN: 86 mg/dL — ABNORMAL HIGH (ref 8–23)
CO2: 22 mmol/L (ref 22–32)
Calcium: 7.3 mg/dL — ABNORMAL LOW (ref 8.9–10.3)
Chloride: 96 mmol/L — ABNORMAL LOW (ref 98–111)
Creatinine, Ser: 10.1 mg/dL — ABNORMAL HIGH (ref 0.44–1.00)
GFR calc Af Amer: 4 mL/min — ABNORMAL LOW (ref >60)
GFR calc non Af Amer: 3 mL/min — ABNORMAL LOW (ref >60)
Glucose, Bld: 129 mg/dL — ABNORMAL HIGH (ref 70–99)
Phosphorus: 8.9 mg/dL — ABNORMAL HIGH (ref 2.5–4.6)
Potassium: 4.6 mmol/L (ref 3.5–5.1)
Sodium: 134 mmol/L — ABNORMAL LOW (ref 135–145)

## 2019-01-03 LAB — CBC
HCT: 37.7 % (ref 36.0–46.0)
HEMATOCRIT: 37.5 % (ref 36.0–46.0)
Hemoglobin: 11.8 g/dL — ABNORMAL LOW (ref 12.0–15.0)
Hemoglobin: 11.7 g/dL — ABNORMAL LOW (ref 12.0–15.0)
MCH: 28.7 pg (ref 26.0–34.0)
MCH: 28.2 pg (ref 26.0–34.0)
MCHC: 31.5 g/dL (ref 30.0–36.0)
MCHC: 31 g/dL (ref 30.0–36.0)
MCV: 91.2 fL (ref 80.0–100.0)
MCV: 90.8 fL (ref 80.0–100.0)
NRBC: 0 % (ref 0.0–0.2)
PLATELETS: 104 10*3/uL — AB (ref 150–400)
Platelets: 123 10*3/uL — ABNORMAL LOW (ref 150–400)
RBC: 4.11 MIL/uL (ref 3.87–5.11)
RBC: 4.15 MIL/uL (ref 3.87–5.11)
RDW: 15.9 % — AB (ref 11.5–15.5)
RDW: 15.9 % — ABNORMAL HIGH (ref 11.5–15.5)
WBC: 5.6 10*3/uL (ref 4.0–10.5)
WBC: 4.6 10*3/uL (ref 4.0–10.5)
nRBC: 0 % (ref 0.0–0.2)

## 2019-01-03 LAB — COMPREHENSIVE METABOLIC PANEL
ALT: 20 U/L (ref 0–44)
AST: 20 U/L (ref 15–41)
Albumin: 3.1 g/dL — ABNORMAL LOW (ref 3.5–5.0)
Alkaline Phosphatase: 51 U/L (ref 38–126)
Anion gap: 17 — ABNORMAL HIGH (ref 5–15)
BUN: 68 mg/dL — AB (ref 8–23)
CALCIUM: 7.5 mg/dL — AB (ref 8.9–10.3)
CHLORIDE: 97 mmol/L — AB (ref 98–111)
CO2: 22 mmol/L (ref 22–32)
Creatinine, Ser: 8.45 mg/dL — ABNORMAL HIGH (ref 0.44–1.00)
GFR, EST AFRICAN AMERICAN: 5 mL/min — AB (ref >60)
GFR, EST NON AFRICAN AMERICAN: 4 mL/min — AB (ref >60)
Glucose, Bld: 111 mg/dL — ABNORMAL HIGH (ref 70–99)
Potassium: 4.6 mmol/L (ref 3.5–5.1)
Sodium: 136 mmol/L (ref 135–145)
Total Bilirubin: 0.7 mg/dL (ref 0.3–1.2)
Total Protein: 5.6 g/dL — ABNORMAL LOW (ref 6.5–8.1)

## 2019-01-03 LAB — PROTIME-INR
INR: 1.27
Prothrombin Time: 15.8 seconds — ABNORMAL HIGH (ref 11.4–15.2)

## 2019-01-03 LAB — GLUCOSE, CAPILLARY: Glucose-Capillary: 119 mg/dL — ABNORMAL HIGH (ref 70–99)

## 2019-01-03 LAB — TROPONIN I

## 2019-01-03 LAB — APTT: APTT: 35 s (ref 24–36)

## 2019-01-03 LAB — ECHOCARDIOGRAM COMPLETE

## 2019-01-03 MED ORDER — ATORVASTATIN CALCIUM 40 MG PO TABS
40.0000 mg | ORAL_TABLET | Freq: Every day | ORAL | Status: DC
  Administered 2019-01-03: 40 mg via ORAL
  Filled 2019-01-03: qty 1

## 2019-01-03 MED ORDER — CLOPIDOGREL BISULFATE 75 MG PO TABS
75.0000 mg | ORAL_TABLET | Freq: Every day | ORAL | Status: DC
  Administered 2019-01-03 – 2019-01-04 (×2): 75 mg via ORAL
  Filled 2019-01-03 (×2): qty 1

## 2019-01-03 MED ORDER — CHLORHEXIDINE GLUCONATE CLOTH 2 % EX PADS: 6.0000 | MEDICATED_PAD | Freq: Every day | CUTANEOUS | Status: DC

## 2019-01-03 MED ORDER — ASPIRIN EC 81 MG PO TBEC
81.0000 mg | DELAYED_RELEASE_TABLET | Freq: Every day | ORAL | Status: DC
  Administered 2019-01-04: 81 mg via ORAL
  Filled 2019-01-03: qty 1

## 2019-01-03 MED ORDER — CARVEDILOL 12.5 MG PO TABS
25.0000 mg | ORAL_TABLET | Freq: Two times a day (BID) | ORAL | Status: DC
  Administered 2019-01-03 – 2019-01-04 (×3): 25 mg via ORAL
  Filled 2019-01-03 (×3): qty 2

## 2019-01-03 NOTE — Progress Notes (Signed)
Occupational Therapy Evaluation Patient Details Name: Alexis Washington MRN: [id number] DOB: Apr 25, 1932 Today's Date: 01/03/2019    History of Present Illness Alexis Washington is a 85 y.o. female with a history of ESRD on dialysis, paroxysmal atrial fibrillation, hypertension, hypothyroidism, and GERD who was transferred to [facility] 01/02/19 for management of ischemic stroke. MRI showed acute 13 mm left frontal white matter small vessel infarct with moderate to severe small vessel changes.   Clinical Impression   Patient presenting with decreased I in self care, balance, functional mobility/transfers, safety awareness, endurance, and strength. Patient reports being mod I PTA. She lives with her husband and performs self care tasks at mod I. She does not drive but her and husband perform IADL tasks together. Patient currently functioning at min - mod A. She describes utilize B platform RW for ambulating at home secondary to elbows at 90  Degrees from nail patella syndrome. Patient will benefit from acute OT to increase overall independence in the areas of ADLs, functional mobility, safety awareness in order to safely discharge home with caregiver. Per pt her daughter is able to help husband provide 24/7 Supervision and assist as needed.    Follow Up Recommendations  Home health OT;Supervision/Assistance - 24 hour    Equipment Recommendations  None recommended by OT    Recommendations for Other Services       Precautions / Restrictions Precautions Precautions: Fall Restrictions Weight Bearing Restrictions: No      Mobility Bed Mobility Overal bed mobility: Needs Assistance Bed Mobility: Supine to Sit     Supine to sit: Min assist     General bed mobility comments: min A for trunk elevation and min cuing for hand placement and technique  Transfers Overall transfer level: Needs assistance Equipment used: None Transfers: Stand Pivot Transfers;Sit to/from Stand Sit to Stand: Mod  assist Stand pivot transfers: Min assist            Balance Overall balance assessment: Needs assistance Sitting-balance support: Feet supported Sitting balance-Leahy Scale: Fair     Standing balance support: During functional activity Standing balance-Leahy Scale: Poor Standing balance comment: min A from therapist           ADL either performed or assessed with clinical judgement   ADL Overall ADL's : Needs assistance/impaired     Grooming: Wash/dry hands;Wash/dry face;Oral care;Applying deodorant;Sitting;Set up   Upper Body Bathing: Set up;Sitting   Lower Body Bathing: Minimal assistance;Sit to/from stand   Upper Body Dressing : Set up;Sitting   Lower Body Dressing: Minimal assistance;Sit to/from stand   Toilet Transfer: Minimal assistance;Moderate assistance Toilet Transfer Details (indicate cue type and reason): mod lifting assistance Toileting- Clothing Manipulation and Hygiene: Sit to/from stand;Moderate assistance               Vision Baseline Vision/History: Wears glasses Wears Glasses: At all times Patient Visual Report: No change from baseline              Pertinent Vitals/Pain Pain Assessment: Faces Pain Score: 1  Faces Pain Scale: No hurt     Hand Dominance Right   Extremity/Trunk Assessment Upper Extremity Assessment Upper Extremity Assessment: Generalized weakness;RUE deficits/detail;LUE deficits/detail RUE Deficits / Details: 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  LUE Deficits / Details: 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    Lower Extremity Assessment Lower Extremity Assessment: Defer to PT evaluation   Cervical / Trunk Assessment Cervical / Trunk Assessment:  Kyphotic   Communication Communication Communication: No difficulties   Cognition Arousal/Alertness: Awake/alert Behavior During Therapy: WFL for tasks assessed/performed Overall Cognitive Status: Within  Functional Limits for tasks assessed                         Home Living Family/patient expects to be discharged to:: Private residence Living Arrangements: Spouse/significant other Available Help at Discharge: Family;Available 24 hours/day Type of Home: House Home Access: Stairs to enter [employer] of Steps: 2   Home Layout: One level     Bathroom Shower/Tub: Teacher, early years/pre: Standard     Home Equipment: Environmental consultant - 4 wheels;Shower seat;Grab bars - tub/shower   Additional Comments: daughter lives across the street and is available 24/7 per pt report  Lives With: Significant other    Prior Functioning/Environment Level of Independence: Independent with assistive device(s)                 OT Problem List: Decreased strength;Decreased knowledge of use of DME or AE;Decreased activity tolerance;Impaired balance (sitting and/or standing);Decreased safety awareness      OT Treatment/Interventions: Self-care/ADL training;Balance training;Therapeutic exercise;Therapeutic activities;Energy conservation;Neuromuscular education;DME and/or AE instruction;Patient/family education;Manual therapy    OT Goals(Current goals can be found in the care plan section) Acute Rehab OT Goals Patient Stated Goal: to go home OT Goal Formulation: With patient Time For Goal Achievement: 01/17/19 Potential to Achieve Goals: Good ADL Goals Pt Will Perform Upper Body Bathing: with supervision Pt Will Perform Lower Body Bathing: with supervision Pt Will Perform Upper Body Dressing: with supervision Pt Will Perform Lower Body Dressing: with supervision Pt Will Transfer to Toilet: with supervision Pt Will Perform Toileting - Clothing Manipulation and hygiene: with supervision  OT Frequency: Min 2X/week   Barriers to D/C: Other (comment)  none known at this time          AM-PAC OT "6 Clicks" Daily Activity     Outcome Measure Help from another person eating  meals?: None Help from another person taking care of personal grooming?: A Little Help from another person toileting, which includes using toliet, bedpan, or urinal?: A Lot Help from another person bathing (including washing, rinsing, drying)?: A Little Help from another person to put on and taking off regular upper body clothing?: A Little Help from another person to put on and taking off regular lower body clothing?: A Little 6 Click Score: 18   End of Session Nurse Communication: Mobility status  Activity Tolerance: Patient tolerated treatment well Patient left: in chair;with call bell/phone within reach;with chair alarm set  OT Visit Diagnosis: Hemiplegia and hemiparesis Hemiplegia - Right/Left: Right Hemiplegia - dominant/non-dominant: Dominant Hemiplegia - caused by: Cerebral infarction                Time: 1324-4010 OT Time Calculation (min): 23 min Charges:  OT General Charges $OT Visit: 1 Visit OT Evaluation $OT Eval Moderate Complexity: 1 Mod OT Treatments $Self Care/Home Management : 8-22 mins   Berdella Bacot P, MS, OTR/L 01/03/2019, 3:19 PM

## 2019-01-03 NOTE — Evaluation (Signed)
Physical Therapy Evaluation Patient Details Name: Alexis Washington MRN: [id number] DOB: August 04, 1932 Today's Date: 01/03/2019   History of Present Illness  Alexis Washington is a 85 y.o. female with a history of ESRD on dialysis, paroxysmal atrial fibrillation, hypertension, hypothyroidism, and GERD who was transferred to [facility] 01/02/19 for management of ischemic stroke. MRI showed acute 13 mm left frontal white matter small vessel infarct with moderate to severe small vessel changes.  Clinical Impression  Pt admitted with/for s/s of stroke.  Pt currently need minimal assist of 1 to 2 persons.  .  Pt currently limited functionally due to the problems listed below.  (see problems list.)  Pt will benefit from PT to maximize function and safety to be able to get home safely with available assist.      Follow Up Recommendations Home health PT;Supervision/Assistance - 24 hour;Supervision for mobility/OOB    Equipment Recommendations  None recommended by PT    Recommendations for Other Services       Precautions / Restrictions Precautions Precautions: Fall Restrictions Weight Bearing Restrictions: No      Mobility  Bed Mobility Overal bed mobility: Needs Assistance Bed Mobility: Supine to Sit     Supine to sit: Min assist     General bed mobility comments: once upright, scoot to EOB pivoting on either elbow without further assist  Transfers Overall transfer level: Needs assistance Equipment used: None Transfers: Sit to/from Stand Sit to Stand: Min assist;+2 physical assistance;+2 safety/equipment Stand pivot transfers: Min assist       General transfer comment: pt came forward okay, but needed boost assist due to weaker right LE  Ambulation/Gait Ambulation/Gait assistance: Min assist;+2 physical assistance Gait Distance (Feet): 15 Feet   Gait Pattern/deviations: Step-to pattern;Step-through pattern;Decreased step length - left;Decreased stance time - right;Decreased step length  - right;Decreased stride length     General Gait Details: mildly hemiparetic on the right LE.  weak-kneed.  Expect that she will do much better with platform RW/  Stairs            Wheelchair Mobility    Modified Rankin (Stroke Patients Only) Modified Rankin (Stroke Patients Only) Pre-Morbid Rankin Score: No significant disability Modified Rankin: Moderately severe disability     Balance Overall balance assessment: Needs assistance Sitting-balance support: Feet supported Sitting balance-Leahy Scale: Fair     Standing balance support: During functional activity Standing balance-Leahy Scale: Poor Standing balance comment: min A from therapist                             Pertinent Vitals/Pain Pain Assessment: Faces Pain Score: 1  Faces Pain Scale: No hurt    Home Living Family/patient expects to be discharged to:: Private residence Living Arrangements: Spouse/significant other Available Help at Discharge: Family;Available 24 hours/day Type of Home: House Home Access: Stairs to enter   [employer] of Steps: 2 Home Layout: One level Home Equipment: Walker - 4 wheels;Shower seat;Grab bars - tub/shower Additional Comments: daughter lives across the street and is available 24/7 per pt report    Prior Function Level of Independence: Independent with assistive device(s)               Hand Dominance   Dominant Hand: Right    Extremity/Trunk Assessment   Upper Extremity Assessment Upper Extremity Assessment: Generalized weakness;RUE deficits/detail;LUE deficits/detail RUE Deficits / Details: 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  LUE Deficits /  Details: 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     Lower Extremity Assessment Lower Extremity Assessment: Overall WFL for tasks assessed(R LE is notably weaker than L LE with generalize weakness)    Cervical / Trunk Assessment Cervical /  Trunk Assessment: Kyphotic  Communication   Communication: No difficulties  Cognition Arousal/Alertness: Awake/alert Behavior During Therapy: WFL for tasks assessed/performed Overall Cognitive Status: Within Functional Limits for tasks assessed                                        General Comments      Exercises     Assessment/Plan    PT Assessment All further PT needs can be met in the next venue of care  PT Problem List Decreased strength;Decreased activity tolerance;Decreased mobility;Decreased balance       PT Treatment Interventions      PT Goals (Current goals can be found in the Care Plan section)  Acute Rehab PT Goals Patient Stated Goal: to go home PT Goal Formulation: With patient Time For Goal Achievement: 01/10/19 Potential to Achieve Goals: Good    Frequency     Barriers to discharge        Co-evaluation               AM-PAC PT "6 Clicks" Mobility  Outcome Measure Help needed turning from your back to your side while in a flat bed without using bedrails?: A Little Help needed moving from lying on your back to sitting on the side of a flat bed without using bedrails?: A Little Help needed moving to and from a bed to a chair (including a wheelchair)?: A Little Help needed standing up from a chair using your arms (e.g., wheelchair or bedside chair)?: A Little Help needed to walk in hospital room?: A Little Help needed climbing 3-5 steps with a railing? : A Lot 6 Click Score: 17    End of Session   Activity Tolerance: Patient tolerated treatment well Patient left: in bed;with call bell/phone within reach;with bed alarm set Nurse Communication: Mobility status PT Visit Diagnosis: Unsteadiness on feet (R26.81);Other abnormalities of gait and mobility (R26.89);Muscle weakness (generalized) (M62.81);Hemiplegia and hemiparesis Hemiplegia - Right/Left: Right Hemiplegia - dominant/non-dominant: Dominant Hemiplegia - caused by:  Cerebral infarction    Time: 1587-2761 PT Time Calculation (min) (ACUTE ONLY): 24 min   Charges:   PT Evaluation $PT Eval Low Complexity: 1 Low PT Treatments $Gait Training: 8-22 mins        01/03/2019  Donnella Sham, PT Acute Rehabilitation Services [phone]  (pager) [phone]  (office)  Tessie Fass Jaymin Waln 01/03/2019, 6:11 PM

## 2019-01-03 NOTE — Plan of Care (Signed)
  Problem: Education: Goal: Knowledge of secondary prevention will improve Outcome: Progressing Goal: Knowledge of patient specific risk factors addressed and post discharge goals established will improve Outcome: Progressing Goal: Individualized Educational Video(s) Outcome: Progressing   

## 2019-01-03 NOTE — Progress Notes (Addendum)
STROKE TEAM PROGRESS NOTE  HPI: Alexis Washington is an 85 y.o. female with ESRD on HD who presents in transfer from [place] ED for stroke management. Symptoms first noticed on 2/19 on awakening with feeling that her right foot was difficult to raise up off the floor when ambulating, in addition to trouble moving the leg while attempting to ambulating. She had had difficulty with balance and ambulation the day before. She has no prior history of stroke, but takes 81 mg ASA daily.   Head CT showed no acute abnormality. There was extensive periventricular white matter hypodense signal, increased relative to the prior study from 2010.   MRI was then obtained, revealing an acute left frontal lobe white matter small vessel ischemic infarction on a background of moderate to severe chronic small vessel ischemic changes. INTERVAL HISTORY Her daughter is on the phone. Dr. Leonie Man spoke to her about dx and tx plan. Pt in the bed. Just back from vascular lab.   Vitals:   01/03/19 0032 01/03/19 0232 01/03/19 0432 01/03/19 0632  BP: (!) 151/63 (!) 158/68 (!) 175/66 (!) 158/61  Pulse: 73 75 73 75  Resp: $Remo'16 18 20 18  'mzNFz$ Temp: 97.7 F (36.5 C) 98.5 F (36.9 C) (!) 97.5 F (36.4 C) 98.3 F (36.8 C)  TempSrc: Oral Oral Axillary Oral  SpO2: 95% 94% 98% 98%    CBC:  Recent Labs  Lab 01/02/19 2236  WBC 5.6  HGB 11.8*  HCT 37.5  MCV 91.2  PLT 104*    Basic Metabolic Panel:  Recent Labs  Lab 01/02/19 2236  NA 136  K 4.6  CL 97*  CO2 22  GLUCOSE 111*  BUN 68*  CREATININE 8.45*  CALCIUM 7.5*   Lipid Panel:     Component Value Date/Time   CHOL 155 01/03/2019 0645   TRIG 74 01/03/2019 0645   HDL 50 01/03/2019 0645   CHOLHDL 3.1 01/03/2019 0645   VLDL 15 01/03/2019 0645   LDLCALC 90 01/03/2019 0645   HgbA1c: No results found for: HGBA1C Urine Drug Screen: No results found for: LABOPIA, COCAINSCRNUR, LABBENZ, AMPHETMU, THCU, LABBARB  Alcohol Level No results found for: ETH  IMAGING No  results found.  PHYSICAL EXAM Pleasant elderly Caucasian lady not in distress. . Afebrile. Head is nontraumatic. Neck is supple without bruit.    Cardiac exam no murmur or gallop. Lungs are clear to auscultation. Distal pulses are well felt. Neurological Exam ;  Awake  Alert oriented x 3. Normal speech and language.eye movements full without nystagmus.fundi were not visualized. Vision acuity and fields appear normal. Hearing is normal. Palatal movements are normal. Face symmetric. Tongue midline. Normal strength, tone, reflexes and coordination except limited moments and bilateral elbows and shoulders due to fixed flexion contractures.mild weakness of right hip flexors and ankle dorsiflexors 4+/5. Normal sensation. Gait deferred.   ASSESSMENT/PLAN [name] is a 85 y.o. female with history of ESRD on HD presenting to [facility] with R leg weakness and difficulty ambulating.   Stroke:  left frontal infarct secondary to small vessel disease  in setting of known AF not on anticoagulation due to GI hemorrhage  CT head [place])  No acute stroke. Extensive white matter disease.    MRI  Oval Linsey) L frontal white matter infarct. Mod to severe small vessel disease  TCD to look at intracranial vasculature  pending   Carotid Doppler  B ICA 1-39% stenosis, VAs antegrade   2D Echo  pending   LDL 90  HgbA1c pending   Heparin 5000 units sq tid for VTE prophylaxis  aspirin 81 mg daily prior to admission, now on aspirin 325 mg daily. Given mild stroke, recommend EC aspirin 81 mg and plavix 75 mg daily x 3 weeks, then aspirin 325 coated alone. Orders adjusted.   Therapy recommendations:  pending   Disposition:  pending   Atrial Fibrillation  Home anticoagulation:  none   Not on AC d/t hx GIB last summer, no source found, but not stable enough for colonoscopy  Not an AC candidate d/t this  Hypertension  Stable . Ok to BP goal normotensive  Hyperlipidemia  Home meds:  No  statin  Now on lipitor 40  LDL 90, goal < 70  Continue statin at discharge  Other Stroke Risk Factors  Advanced age  Other Active Problems  ESRD on HD MWF [place] with nail-patella syndrome  Hospital day # [place], MSN, APRN, ANVP-BC, AGPCNP-BC Advanced Practice Stroke Nurse [place] for Schedule & Pager information 01/03/2019 10:40 AM  I have personally obtained history,examined this patient, reviewed notes, independently viewed imaging studies, participated in medical decision making and plan of care.ROS completed by me personally and pertinent positives fully documented  I have made any additions or clarifications directly to the above note. Agree with note above. She presented with right leg weakness due to small left frontal lacunar infarct likely from small vessel disease even though she has history of atrial fibrillation. She has not been on anticoagulation due to history of GI hemorrhage last year. Recommend coated aspirin 81 mg along with Plavix 75 mg for 3 weeks followed by aspirin alone. Continue ongoing stroke workup and aggressive risk factor modification. Physical occupational therapy and rehabilitation consults. Long discussion with the patient and with her daughter whom I spoke to over the phone and answered questions. Discussed with Dr. Evert Kohl. Greater than 50% time during this 35 minute visit was spent on counseling and coordination of care about her stroke and answering questions  Antony Contras, MD Medical Director [place] Pager: [phone] 01/03/2019 2:00 PM  To contact Stroke Continuity provider, please refer to [email or web address] After hours, contact General Neurology

## 2019-01-03 NOTE — Progress Notes (Signed)
Transcranial Doppler completed. Results pending.  01/03/2019 3:07 PM Maudry Mayhew, MHA, RVT, RDCS, RDMS

## 2019-01-03 NOTE — Progress Notes (Signed)
Peripheral IV removed from patient's left hand without complication. Patient tolerated removal well. Unable to document the removal in LDA because insertion was not documented when it was placed.

## 2019-01-03 NOTE — Consult Note (Addendum)
[facility] Renal Consultation Note    Indication for Consultation:  Management of ESRD/hemodialysis, anemia, hypertension/volume, and secondary hyperparathyroidism. PCP:  HPI: Alexis Washington is a 85 y.o. female with a history of ESRD on dialysis, paroxysmal atrial fibrillation, hypertension, hypothyroidism, and GERD who was transferred to [facility] yesterday for management of ischemic stroke. Patient reports she work up yesterday and felt like her right leg was "glued to the floor." She was not on full anticoagulation (for A-fib) due to history of GI bleed but took baby aspirin. She presented to [facility] ED and was transferred here for further work up of ischemic stroke. Normally dialyzes MWF at [facility] but missed her treatment yesterday. On presentation, blood pressure was elevated but vital signs were otherwise stable. K 4.6, Ca 7.5, Albumin 3.1, Hgb 11.8 and Plt 104. Head CT showed no acute intracranial pathology but MRI showed acute 13 mm left frontal white matter small vessel infarct with moderate to severe small vessel changes. Neuro was consulted and started Plavix in addition to aspirin, and ordered echocardiogram and carotid dopplers.   Today, patient reports she is having some nausea and abdominal heaviness. She believes she will feel better after dialysis. She denies any SOB/dyspnea, chest pain, palpitations, peripheral edema, abdominal pain or diarrhea. Reports ongoing R leg weakness but denies any further neurological symptoms. She is concerned about missing her dialysis yesterday. CXR reportedly showed COPD changes with small L pleural effusion and L basilar atelectasis.   Past Medical History:  Diagnosis Date  . Anemia in chronic renal disease    ESRD- Sees Dr Hyman Hopes  . Arthritis   . Benign lipomatous neoplasm of kidney   . Complication of anesthesia 1998   "blood pressure dropped low during surgery and after surgery it went up really high."   . Diverticulitis   . ESRD (end  stage renal disease) on dialysis [facility])    "MWF; [place]" (11/08/2017)  . GERD (gastroesophageal reflux disease)   . Heart murmur    PCP- said it is nothing to be concerned  . HOH (hard of hearing)   . Hyperkalemia   . Hyperphosphatemia   . Hypertension   . Hypertensive kidney disease   . Hypothyroidism   . Nail-patella syndrome   . PONV (postoperative nausea and vomiting)   . Proteinuria   . Shortness of breath dyspnea    Past Surgical History:  Procedure Laterality Date  . ABDOMINAL HYSTERECTOMY    . APPENDECTOMY    . AV FISTULA PLACEMENT Right 08/10/2015   Procedure: Right Arm Arteriovenous Brachio-cephalic Fistula ;  Surgeon: Fransisco Hertz, MD;  Location: [facility] OR;  Service: Vascular;  Laterality: Right;  . BASCILIC VEIN TRANSPOSITION Right 12/15/2015   Procedure: SECOND STAGE Right Arm BRACHIAL VEIN TRANSPOSITION;  Surgeon: Fransisco Hertz, MD;  Location: [facility] OR;  Service: Vascular;  Laterality: Right;  . CATARACT EXTRACTION W/ INTRAOCULAR LENS  IMPLANT, BILATERAL    . CHOLECYSTECTOMY  1998  . COLONOSCOPY    . ENTEROSCOPY N/A 04/17/2018   Procedure: ENTEROSCOPY;  Surgeon: Charlott Rakes, MD;  Location: [facility] ENDOSCOPY;  Service: Endoscopy;  Laterality: N/A;  . ESOPHAGOGASTRODUODENOSCOPY (EGD) WITH PROPOFOL N/A 04/14/2018   Procedure: ESOPHAGOGASTRODUODENOSCOPY (EGD) WITH PROPOFOL;  Surgeon: Bernette Redbird, MD;  Location: [facility] ENDOSCOPY;  Service: Endoscopy;  Laterality: N/A;  . FISTULA SUPERFICIALIZATION Right 08/10/2015   Procedure: Right First Stage Brachial Transposition;  Surgeon: Fransisco Hertz, MD;  Location: [facility] OR;  Service: Vascular;  Laterality: Right;  . GIVENS  CAPSULE STUDY N/A 04/14/2018   Procedure: GIVENS CAPSULE STUDY;  Surgeon: Ronald Lobo, MD;  Location: [facility] ENDOSCOPY;  Service: Endoscopy;  Laterality: N/A;  . HOT HEMOSTASIS N/A 04/17/2018   Procedure: HOT HEMOSTASIS (ARGON PLASMA COAGULATION/BICAP);  Surgeon: Wilford Corner, MD;  Location: [place];   Service: Endoscopy;  Laterality: N/A;  . INSERTION OF DIALYSIS CATHETER N/A 08/10/2015   Procedure: INSERTION OF Right Internal Jugular DIALYSIS CATHETER;  Surgeon: Conrad Go[place] Creek, MD;  Location: [place];  Service: Vascular;  Laterality: N/A;  . IR THORACENTESIS ASP PLEURAL SPACE W/IMG GUIDE  05/11/2017  . TONSILLECTOMY     Family History  Problem Relation Age of Onset  . Hypertension Mother   . Heart disease Father        before age 52  . Hyperlipidemia Father   . Hypertension Father   . Heart attack Father    Social History:  reports that she has never smoked. She has never used smokeless tobacco. She reports that she does not drink alcohol or use drugs.  ROS: As per HPI otherwise negative.  Physical Exam: Vitals:   01/03/19 0632 01/03/19 0832 01/03/19 1136 01/03/19 1231  BP: (!) 158/61 (!) 147/64 (!) 178/67 (!) 175/58  Pulse: 75 73 73 72  Resp: $Remo'18 20 16 20  'ldlyR$ Temp: 98.3 F (36.8 C) 98.3 F (36.8 C) 98.4 F (36.9 C) 98.3 F (36.8 C)  TempSrc: Oral Oral Oral Oral  SpO2: 98% 98% 95% 93%     General: Well developed, well nourished, in no acute distress. Head: NCAT, sclera non-icteric, mucus membranes are moist. Lungs: CTA without wheezes, rales, or rhonchi. Breathing is unlabored. Heart: RRR with normal S1, S2. No murmurs, rubs, or gallops appreciated. Abdomen: Soft, non-tender, non-distended. Normoactive bowel sounds. No rebound/guarding. No obvious abdominal masses. + Belching Lower extremities: No edema or ischemic changes, no open wounds. Neuro: Alert and oriented X 3. + R leg weakness Psych:  Responds to questions appropriately with a normal affect. Dialysis Access: RUE AVF, + thrill  Allergies  Allergen Reactions  . Codeine Nausea And Vomiting  . Novocain [Procaine] Palpitations   Prior to Admission medications   Medication Sig Start Date End Date Taking? Authorizing Provider  acetaminophen (TYLENOL) 500 MG tablet Take 500 mg by mouth every 8 (eight) hours as needed  for mild pain.   Yes [provider]  aspirin EC 81 MG tablet Take 81 mg by mouth daily.   Yes [provider]  calcium acetate (PHOSLO) 667 MG capsule Take 1,334 mg by mouth 3 (three) times daily.  04/21/17  Yes [provider]  carvedilol (COREG) 25 MG tablet Take 1 tablet (25 mg total) by mouth 2 (two) times daily. 11/01/18 01/30/19 Yes Revankar, Reita Cliche, MD  Chlorpheniramine-Acetaminophen (CORICIDIN HBP COLD/FLU PO) Take 1 tablet by mouth as needed (cold symptoms).   Yes [provider]  docusate sodium (COLACE) 100 MG capsule Take 1 capsule (100 mg total) by mouth 2 (two) times daily as needed for mild constipation. 05/12/17  Yes Amin, Ankit Chirag, MD  fluticasone (FLONASE) 50 MCG/ACT nasal spray Place 1 spray into both nostrils as needed for allergies or rhinitis.   Yes [provider]  folic acid-vitamin b complex-vitamin c-selenium-zinc (DIALYVITE) 3 MG TABS tablet Take 1 tablet by mouth daily.   Yes [provider]  levothyroxine (SYNTHROID, LEVOTHROID) 150 MCG tablet Take 150 mcg by mouth daily.  08/28/17  Yes [provider]  Lidocaine-Prilocaine, Bulk, 2.5-2.5 % CREA Apply 1  application topically as needed (before dialysis).   Yes [provider]  nitroGLYCERIN (NITROSTAT) 0.4 MG SL tablet Place 1 tablet (0.4 mg total) under the tongue every 5 (five) minutes as needed. 11/01/18 01/30/19 Yes Revankar, Reita Cliche, MD  omeprazole (PRILOSEC) 40 MG capsule Take 40 mg by mouth daily.   Yes [provider]  OVER THE COUNTER MEDICATION Place 1 drop into both eyes daily as needed (allergy symptoms). Allergy Relief eye drops   Yes [provider]  polyethylene glycol (MIRALAX / GLYCOLAX) packet Take 17 g by mouth daily as needed. Patient taking differently: Take 17 g by mouth daily as needed for mild constipation.  05/12/17  Yes Amin, Jeanella Flattery, MD  Probiotic Product (DIGESTIVE ADVANTAGE) CAPS Take 1 capsule by  mouth daily.   Yes [provider]  raloxifene (EVISTA) 60 MG tablet Take 60 mg by mouth daily. 04/24/17  Yes [provider]   Current Facility-Administered Medications  Medication Dose Route Frequency Provider Last Rate Last Dose  . acetaminophen (TYLENOL) tablet 650 mg  650 mg Oral Q4H PRN Shela Leff, MD       Or  . acetaminophen (TYLENOL) solution 650 mg  650 mg Per Tube Q4H PRN Shela Leff, MD       Or  . acetaminophen (TYLENOL) suppository 650 mg  650 mg Rectal Q4H PRN Shela Leff, MD      . Derrill Memo ON 01/04/2019] aspirin EC tablet 81 mg  81 mg Oral Daily Biby, Sharon L, NP      . atorvastatin (LIPITOR) tablet 40 mg  40 mg Oral q1800 Shela Leff, MD      . clopidogrel (PLAVIX) tablet 75 mg  75 mg Oral Daily Burnetta Sabin L, NP      . heparin injection 5,000 Units  5,000 Units Subcutaneous Q8H Shela Leff, MD   5,000 Units at 01/03/19 [phone]  . senna-docusate (Senokot-S) tablet 1 tablet  1 tablet Oral QHS PRN Shela Leff, MD      . simethicone (MYLICON) chewable tablet 80 mg  80 mg Oral QID PRN Shela Leff, MD   80 mg at 01/02/19 2326   Labs: Basic Metabolic Panel: Recent Labs  Lab 01/02/19 2236  NA 136  K 4.6  CL 97*  CO2 22  GLUCOSE 111*  BUN 68*  CREATININE 8.45*  CALCIUM 7.5*   Liver Function Tests: Recent Labs  Lab 01/02/19 2236  AST 20  ALT 20  ALKPHOS 51  BILITOT 0.7  PROT 5.6*  ALBUMIN 3.1*   CBC: Recent Labs  Lab 01/02/19 2236  WBC 5.6  HGB 11.8*  HCT 37.5  MCV 91.2  PLT 104*   Cardiac Enzymes: Recent Labs  Lab 01/02/19 2236  TROPONINI <0.03   CBG: Recent Labs  Lab 01/03/19 0606  GLUCAP 119*   Studies/Results: Vas US Carotid (at [place] Only)  Result Date: 01/03/2019 Carotid Arterial Duplex Study Indications:       CVA and Weakness. Risk Factors:      Hypertension. Other Factors:     ESRD, on dialysis. Comparison Study:  No prior study on file Performing Technologist:  Sharion Dove RVS  Examination Guidelines: A complete evaluation includes B-mode imaging, spectral Doppler, color Doppler, and power Doppler as needed of all accessible portions of each vessel. Bilateral testing is considered an integral part of a complete examination. Limited examinations for reoccurring indications may be performed as noted.  Right Carotid Findings: +----------+--------+--------+--------+------------+---------+  PSV cm/sEDV cm/sStenosisDescribe    Comments  +----------+--------+--------+--------+------------+---------+ CCA Prox  86      7                                     +----------+--------+--------+--------+------------+---------+ CCA Distal60      7                                     +----------+--------+--------+--------+------------+---------+ ICA Prox  65      10              heterogenousShadowing +----------+--------+--------+--------+------------+---------+ ICA Distal96      15                                    +----------+--------+--------+--------+------------+---------+ ECA       122     4                                     +----------+--------+--------+--------+------------+---------+ +----------+--------+-------+--------+-------------------+           PSV cm/sEDV cmsDescribeArm Pressure (mmHG) +----------+--------+-------+--------+-------------------+ [id number]                                        +----------+--------+-------+--------+-------------------+ +---------+--------+--+--------+-+ VertebralPSV cm/s46EDV cm/s8 +---------+--------+--+--------+-+  Left Carotid Findings: +----------+--------+--------+--------+--------+------------------+           PSV cm/sEDV cm/sStenosisDescribeComments           +----------+--------+--------+--------+--------+------------------+ CCA Prox  85      8                       intimal thickening  +----------+--------+--------+--------+--------+------------------+ CCA Distal67      7                       intimal thickening +----------+--------+--------+--------+--------+------------------+ ICA Prox  153     19              calcific                   +----------+--------+--------+--------+--------+------------------+ ICA Mid                                   tortuous           +----------+--------+--------+--------+--------+------------------+ ICA Distal99      18                                         +----------+--------+--------+--------+--------+------------------+ ECA       282     17                                         +----------+--------+--------+--------+--------+------------------+ +----------+--------+--------+--------+-------------------+ SubclavianPSV cm/sEDV cm/sDescribeArm Pressure (mmHG) +----------+--------+--------+--------+-------------------+           109                                         +----------+--------+--------+--------+-------------------+ +---------+--------+--+--------+--+  VertebralPSV cm/s54EDV cm/s10 +---------+--------+--+--------+--+  Summary: Right Carotid: Velocities in the right ICA are consistent with a 1-39% stenosis. Left Carotid: Velocities in the left ICA are consistent with a 1-39% stenosis. Vertebrals:  Bilateral vertebral arteries demonstrate antegrade flow. Subclavians: Normal flow hemodynamics were seen in bilateral subclavian              arteries. *See table(s) above for measurements and observations.  Electronically signed by Antony Contras MD on 01/03/2019 at 11:57:10 AM.    Final     Dialysis Orders: Center:   on MWF. 3.0 K, 2.25 Ca 180NRe Optiflux, Time: 4:15 hours  RUE AVF BFR 350, DFR 800 EDW 52.5kg Heparin: 1300 units IV bolus Calcitriol: 0.34mcg PO 3x week during dialysis Binder: Calcium acetate 2 caps PO TID with meals ESA: none  Recent labs:  12/26/2018: K 4.7, Hgb 11.4.  12/05/2018: Tsat 40.0, Ca 8.2, Phos 5.3, PTH 365.0  Assessment/Plan: 1. R leg weakness: MRI showing left frontal white matter infarct w/mod to severe small vessel disease: Carotid doppler showed B ICA 1-39% stenosis, 2D echo pending. Conservative management per stroke team. Currently on heparin, neuro recommending aspirin and Plavix for 3 weeks then aspirin alone.  2. Atrial fibrillation: Not on anticoagulation due to history of GI bleed. Per primary team. 3.  ESRD:  Dialyzes MWF but last HD on 12/31/2018. Will dialyze today for make up treatment.   4.  Hypertension/volume: BP slightly elevated, goal is normotensive per neuro notes. Continue home medications and UF 2.5L today as tolerated.  5.  Anemia: Hemoglobin ok, not on ESA.  6.  Metabolic bone disease: Corrected calcium ok, phos pending. Continue calcitriol and calcium acetate as binder.  7.  Nutrition:  Albumin low, will start pro-stat supplement.  8. Hyperlipidemia: On statin  Anice Paganini, PA-C 01/03/2019, 12:35 PM  [facility] Pager: [phone]  Pt seen, examined and agree w A/P as above. ESRD pt w/ new CVA R leg weakness and frontal CVA on L per imaging. Will follow.  Will try to get pt dialyzed today if possible (missed HD yest).  [facility] 01/03/2019, 1:54 PM

## 2019-01-03 NOTE — Consult Note (Addendum)
Referring Physician: Dr. Marlowe Sax    Chief Complaint: Stroke  HPI: Alexis Washington is an 85 y.o. female with ESRD on HD who presents in transfer from [facility] ED for stroke management. Symptoms first noticed on 2/19 on awakening with feeling that her right foot was difficult to raise up off the floor when ambulating, in addition to trouble moving the leg while attempting to ambulating. She had had difficulty with balance and ambulation the day before. She has no prior history of stroke, but takes 81 mg ASA daily.   Head CT showed no acute abnormality. There was extensive periventricular white matter hypodense signal, increased relative to the prior study from 2010.   MRI was then obtained, revealing an acute left frontal lobe white matter small vessel ischemic infarction on a background of moderate to severe chronic small vessel ischemic changes.   Past Medical History:  Diagnosis Date  . Anemia in chronic renal disease    ESRD- Sees Dr Justin Mend  . Arthritis   . Benign lipomatous neoplasm of kidney   . Complication of anesthesia 1998   "blood pressure dropped low during surgery and after surgery it went up really high."   . Diverticulitis   . ESRD (end stage renal disease) on dialysis [facility])    "MWF; [place]" (11/08/2017)  . GERD (gastroesophageal reflux disease)   . Heart murmur    PCP- said it is nothing to be concerned  . HOH (hard of hearing)   . Hyperkalemia   . Hyperphosphatemia   . Hypertension   . Hypertensive kidney disease   . Hypothyroidism   . Nail-patella syndrome   . PONV (postoperative nausea and vomiting)   . Proteinuria   . Shortness of breath dyspnea     Past Surgical History:  Procedure Laterality Date  . ABDOMINAL HYSTERECTOMY    . APPENDECTOMY    . AV FISTULA PLACEMENT Right 08/10/2015   Procedure: Right Arm Arteriovenous Brachio-cephalic Fistula ;  Surgeon: Conrad Butler, MD;  Location: [place];  Service: Vascular;  Laterality: Right;  .  BASCILIC VEIN TRANSPOSITION Right 12/15/2015   Procedure: SECOND STAGE Right Arm BRACHIAL VEIN TRANSPOSITION;  Surgeon: Conrad [place], MD;  Location: [place];  Service: Vascular;  Laterality: Right;  . CATARACT EXTRACTION W/ INTRAOCULAR LENS  IMPLANT, BILATERAL    . CHOLECYSTECTOMY  1998  . COLONOSCOPY    . ENTEROSCOPY N/A 04/17/2018   Procedure: ENTEROSCOPY;  Surgeon: Wilford Corner, MD;  Location[facility] ENDOSCOPY;  Service: Endoscopy;  Laterality: N/A;  . ESOPHAGOGASTRODUODENOSCOPY (EGD) WITH PROPOFOL N/A 04/14/2018   Procedure: ESOPHAGOGASTRODUODENOSCOPY (EGD) WITH PROPOFOL;  Surgeon: Ronald Lobo, MD;  Location: [place];  Service: Endoscopy;  Laterality: N/A;  . FISTULA SUPERFICIALIZATION Right 08/10/2015   Procedure: Right First Stage Brachial Transposition;  Surgeon: Conrad Emlyn, MD;  Location: [place];  Service: Vascular;  Laterality: Right;  . GIVENS CAPSULE STUDY N/A 04/14/2018   Procedure: GIVENS CAPSULE STUDY;  Surgeon: Ronald Lobo, MD;  Location: [facility] ENDOSCOPY;  Service: Endoscopy;  Laterality: N/A;  . HOT HEMOSTASIS N/A 04/17/2018   Procedure: HOT HEMOSTASIS (ARGON PLASMA COAGULATION/BICAP);  Surgeon: Wilford Corner, MD;  Location: [place];  Service: Endoscopy;  Laterality: N/A;  . INSERTION OF DIALYSIS CATHETER N/A 08/10/2015   Procedure: INSERTION OF Right Internal Jugular DIALYSIS CATHETER;  Surgeon: Conrad Milton, MD;  Location: [place];  Service: Vascular;  Laterality: N/A;  . IR THORACENTESIS ASP PLEURAL SPACE W/IMG GUIDE  05/11/2017  . TONSILLECTOMY  Family History  Problem Relation Age of Onset  . Hypertension Mother   . Heart disease Father        before age 47  . Hyperlipidemia Father   . Hypertension Father   . Heart attack Father    Social History:  reports that she has never smoked. She has never used smokeless tobacco. She reports that she does not drink alcohol or use drugs.  Allergies:  Allergies  Allergen Reactions  . Codeine Nausea And Vomiting    . Novocain [Procaine] Palpitations    Home Medications:    ROS: No trouble with speech, numbness or tingling. No facial droop.    Physical Examination: Blood pressure (!) 158/68, pulse 75, temperature 98.5 F (36.9 C), temperature source Oral, resp. rate 18, SpO2 94 %.  HEENT: [place]/AT  Lungs: Respirations unlabored Ext: Chronic flexion contractures at elbows bilaterally. Decrease ROM at shoulders bilaterally  Neurologic Examination: Mental Status: Alert, fully oriented, thought content appropriate.  Speech fluent with intact comprehension and naming. Subtle dysarthria. Able to follow all commands without difficulty. Cranial Nerves: II:  Visual fields intact with no extinction to DSS. PERRL.  III,IV, VI: EOMI without nystagmus. No ptosis.  V,VII: No facial droop. Temp sensation equal bilaterally VIII: hearing intact to voice IX,X: No hoarseness or hypophonia. Palate elevates equally XI: Symmetric shoulder shrug XII: midline tongue extension  Motor: RUE: 4/5 in the context of chronic flexion contractures at elbows LUE: 4+/5 in the context of chronic flexion contractures at elbows RLE: 4+/5 LLE: 5/5 Sensory: Temp and light touch intact x 4 without asymmetry. No extinction to DSS.  Deep Tendon Reflexes:  1+ brachioradialis bilaterally. 4+ left patellar (crossed adductor), 2+ right patellar Plantars: Right: downgoing   Left: downgoing Cerebellar: No ataxia with FNF bilaterally Gait: Deferred  Results for orders placed or performed during the hospital encounter of 01/02/19 (from the past 48 hour(s))  CBC     Status: Abnormal   Collection Time: 01/02/19 10:36 PM  Result Value Ref Range   WBC 5.6 4.0 - 10.5 K/uL   RBC 4.11 3.87 - 5.11 MIL/uL   Hemoglobin 11.8 (L) 12.0 - 15.0 g/dL   HCT 37.5 36.0 - 46.0 %   MCV 91.2 80.0 - 100.0 fL   MCH 28.7 26.0 - 34.0 pg   MCHC 31.5 30.0 - 36.0 g/dL   RDW 15.9 (H) 11.5 - 15.5 %   Platelets 104 (L) 150 - 400 K/uL    Comment: REPEATED TO  VERIFY PLATELET COUNT CONFIRMED BY SMEAR Immature Platelet Fraction may be clinically indicated, consider ordering this additional test [id number]    nRBC 0.0 0.0 - 0.2 %    Comment: Performed at [facility], [place] [address] [place], [place] [address]  Protime-INR     Status: Abnormal   Collection Time: 01/02/19 10:36 PM  Result Value Ref Range   Prothrombin Time 15.8 (H) 11.4 - 15.2 seconds   INR 1.27     Comment: Performed at [facility] [address], [place], [place] [address]  APTT     Status: None   Collection Time: 01/02/19 10:36 PM  Result Value Ref Range   aPTT 35 24 - 36 seconds    Comment: Performed at [facility] [address] [place], [place] [address]  Comprehensive metabolic panel     Status: Abnormal   Collection Time: 01/02/19 10:36 PM  Result Value Ref Range   Sodium 136 135 - 145 mmol/L  Potassium 4.6 3.5 - 5.1 mmol/L   Chloride 97 (L) 98 - 111 mmol/L   CO2 22 22 - 32 mmol/L   Glucose, Bld 111 (H) 70 - 99 mg/dL   BUN 68 (H) 8 - 23 mg/dL   Creatinine, Ser 8.45 (H) 0.44 - 1.00 mg/dL   Calcium 7.5 (L) 8.9 - 10.3 mg/dL   Total Protein 5.6 (L) 6.5 - 8.1 g/dL   Albumin 3.1 (L) 3.5 - 5.0 g/dL   AST 20 15 - 41 U/L   ALT 20 0 - 44 U/L   Alkaline Phosphatase 51 38 - 126 U/L   Total Bilirubin 0.7 0.3 - 1.2 mg/dL   GFR calc non Af Amer 4 (L) >60 mL/min   GFR calc Af Amer 5 (L) >60 mL/min   Anion gap 17 (H) 5 - 15    Comment: Performed at [facility], [place] [address] [place], [place] [address]  Troponin I - ONCE - STAT     Status: None   Collection Time: 01/02/19 10:36 PM  Result Value Ref Range   Troponin I <0.03 <0.03 ng/mL    Comment: Performed at [facility], [place] [address], [place], [place] [address]   No results found.  Assessment: 85 y.o. female with acute onset of RLE weakness 1. MRI at OSH revealed an acute left frontal lobe white matter small vessel ischemic infarction on a background of moderate to severe  chronic small vessel ischemic changes. 2. Stroke Risk Factors - HTN 3. Chronic joint deformities, worse in the upper extremities, secondary to nail-patella syndrome 4. ESRD 5. Classifiable as having failed ASA monotherapy for stroke prevention 6. Paroxysmal atrial fibrillation. Not on anticoagulation due to history of GI bleed with anemia.    Recommendations: 1. HgbA1c, fasting lipid panel 2. MRA of the brain without contrast 3. PT consult, OT consult, Speech consult 4. Echocardiogram 5. Carotid dopplers 6. Prophylactic therapy- Add Plavix to ASA. She is not on anticoagulation for her paroxysmal a-fib due to a history of GI bleed with anemia 7. Risk factor modification 8. Telemetry monitoring 9. Frequent neuro checks 10. Overall benefits of statin may be outweighed by risks given the patient's advanced age 8. BP management. Out of permissive HTN time window.    '@Electronically'$  signed: Dr. Kerney Elbe  01/03/2019, 4:08 AM

## 2019-01-03 NOTE — Progress Notes (Signed)
PROGRESS NOTE    Alexis Washington  LEX:[id number] DOB: 08/22/1932 DOA: 01/02/2019 PCP: Rochel Brome, MD   Brief Narrative: Alexis Washington is a 85 y.o. female with medical history significant of ESRD on hemodialysis MWF, paroxysmal atrial fibrillation, hypertension, hypothyroidism, GERD. Patient presented right sided weakness found to have a stroke.   Assessment & Plan:   Principal Problem:   Acute CVA (cerebrovascular accident) ([place]) Active Problems:   ESRD on dialysis ([place])   Hypertension   Anemia   AF (paroxysmal atrial fibrillation) ([place])   Acute CVA MRI from outside hospital significant for acute left frontal small vessel infarct. Neurology consulted. Started on full dose aspirin and Plavix. LDL of 90, started on atorvastatin 40 mg. Hemoglobin A1C pending -May consider discontinuing Lipitor based on neurology recommendations -Transthoracic Echocardiogram, Carotid dopplers per neurology -Aspirin 81 (coated) and Plavix per neurology  ESRD On HD MWF. Did not have HD on Wednesday. -Nephrology consulted and following  Essential hypertension -Continue Coreg  Paroxysmal atrial fibrillation -Continue Coreg  Chronic normocytic anemia In setting of chronic disease. Currently stable.  DVT prophylaxis: Heparin drip Code Status:   Code Status: Full Code Family Communication: None at bedside Disposition Plan: Discharge likely in 24 hours pending stroke workup completion   Consultants:   Neurology  Procedures:   Transthoracic Echocardiogram (pending)  Carotid ultrasound (pending)  Antimicrobials:  None    Subjective: No issues overnight.  Objective: Vitals:   01/03/19 0632 01/03/19 0832 01/03/19 1136 01/03/19 1231  BP: (!) 158/61 (!) 147/64 (!) 178/67 (!) 175/58  Pulse: 75 73 73 72  Resp: $Remo'18 20 16 20  'rBvVy$ Temp: 98.3 F (36.8 C) 98.3 F (36.8 C) 98.4 F (36.9 C) 98.3 F (36.8 C)  TempSrc: Oral Oral Oral Oral  SpO2: 98% 98% 95% 93%    Intake/Output Summary  (Last 24 hours) at 01/03/2019 1357 Last data filed at 01/03/2019 1232 Gross per 24 hour  Intake 180 ml  Output -  Net 180 ml   There were no vitals filed for this visit.  Examination:  General exam: Appears calm and comfortable  Respiratory system: Clear to auscultation. Respiratory effort normal. Cardiovascular system: S1 & S2 heard, RRR. No murmurs, rubs, gallops or clicks. Gastrointestinal system: Abdomen is nondistended, soft and nontender. No organomegaly or masses felt. Normal bowel sounds heard. Central nervous system: Alert and oriented. RLE 4/5 strength compared to 5/5 of LLE Extremities: No edema. No calf tenderness Skin: No cyanosis. No rashes Psychiatry: Judgement and insight appear normal. Mood & affect appropriate.     Data Reviewed: I have personally reviewed following labs and imaging studies  CBC: Recent Labs  Lab 01/02/19 2236  WBC 5.6  HGB 11.8*  HCT 37.5  MCV 91.2  PLT 449*   Basic Metabolic Panel: Recent Labs  Lab 01/02/19 2236  NA 136  K 4.6  CL 97*  CO2 22  GLUCOSE 111*  BUN 68*  CREATININE 8.45*  CALCIUM 7.5*   GFR: CrCl cannot be calculated (Unknown ideal weight.). Liver Function Tests: Recent Labs  Lab 01/02/19 2236  AST 20  ALT 20  ALKPHOS 51  BILITOT 0.7  PROT 5.6*  ALBUMIN 3.1*   No results for input(s): LIPASE, AMYLASE in the last 168 hours. No results for input(s): AMMONIA in the last 168 hours. Coagulation Profile: Recent Labs  Lab 01/02/19 2236  INR 1.27   Cardiac Enzymes: Recent Labs  Lab 01/02/19 2236  TROPONINI <0.03   BNP (last 3 results) No results for  input(s): PROBNP in the last 8760 hours. HbA1C: No results for input(s): HGBA1C in the last 72 hours. CBG: Recent Labs  Lab 01/03/19 0606  GLUCAP 119*   Lipid Profile: Recent Labs    01/03/19 0645  CHOL 155  HDL 50  LDLCALC 90  TRIG 74  CHOLHDL 3.1   Thyroid Function Tests: No results for input(s): TSH, T4TOTAL, FREET4, T3FREE, THYROIDAB  in the last 72 hours. Anemia Panel: No results for input(s): VITAMINB12, FOLATE, FERRITIN, TIBC, IRON, RETICCTPCT in the last 72 hours. Sepsis Labs: No results for input(s): PROCALCITON, LATICACIDVEN in the last 168 hours.  No results found for this or any previous visit (from the past 240 hour(s)).       Radiology Studies: Vas US Carotid (at [facility])  Result Date: 01/03/2019 Carotid Arterial Duplex Study Indications:       CVA and Weakness. Risk Factors:      Hypertension. Other Factors:     ESRD, on dialysis. Comparison Study:  No prior study on file Performing Technologist: Sherren Kerns RVS  Examination Guidelines: A complete evaluation includes B-mode imaging, spectral Doppler, color Doppler, and power Doppler as needed of all accessible portions of each vessel. Bilateral testing is considered an integral part of a complete examination. Limited examinations for reoccurring indications may be performed as noted.  Right Carotid Findings: +----------+--------+--------+--------+------------+---------+           PSV cm/sEDV cm/sStenosisDescribe    Comments  +----------+--------+--------+--------+------------+---------+ CCA Prox  86      7                                     +----------+--------+--------+--------+------------+---------+ CCA Distal60      7                                     +----------+--------+--------+--------+------------+---------+ ICA Prox  65      10              heterogenousShadowing +----------+--------+--------+--------+------------+---------+ ICA Distal96      15                                    +----------+--------+--------+--------+------------+---------+ ECA       122     4                                     +----------+--------+--------+--------+------------+---------+ +----------+--------+-------+--------+-------------------+           PSV cm/sEDV cmsDescribeArm Pressure (mmHG)  +----------+--------+-------+--------+-------------------+ [id number]                                        +----------+--------+-------+--------+-------------------+ +---------+--------+--+--------+-+ VertebralPSV cm/s46EDV cm/s8 +---------+--------+--+--------+-+  Left Carotid Findings: +----------+--------+--------+--------+--------+------------------+           PSV cm/sEDV cm/sStenosisDescribeComments           +----------+--------+--------+--------+--------+------------------+ CCA Prox  85      8                       intimal thickening +----------+--------+--------+--------+--------+------------------+ CCA Distal67      7  intimal thickening +----------+--------+--------+--------+--------+------------------+ ICA Prox  153     19              calcific                   +----------+--------+--------+--------+--------+------------------+ ICA Mid                                   tortuous           +----------+--------+--------+--------+--------+------------------+ ICA Distal99      18                                         +----------+--------+--------+--------+--------+------------------+ ECA       282     17                                         +----------+--------+--------+--------+--------+------------------+ +----------+--------+--------+--------+-------------------+ SubclavianPSV cm/sEDV cm/sDescribeArm Pressure (mmHG) +----------+--------+--------+--------+-------------------+           109                                         +----------+--------+--------+--------+-------------------+ +---------+--------+--+--------+--+ VertebralPSV cm/s54EDV cm/s10 +---------+--------+--+--------+--+  Summary: Right Carotid: Velocities in the right ICA are consistent with a 1-39% stenosis. Left Carotid: Velocities in the left ICA are consistent with a 1-39% stenosis. Vertebrals:  Bilateral vertebral arteries  demonstrate antegrade flow. Subclavians: Normal flow hemodynamics were seen in bilateral subclavian              arteries. *See table(s) above for measurements and observations.  Electronically signed by Antony Contras MD on 01/03/2019 at 11:57:10 AM.    Final         Scheduled Meds: . [START ON 01/04/2019] aspirin EC  81 mg Oral Daily  . atorvastatin  40 mg Oral q1800  . carvedilol  25 mg Oral BID  . [START ON 01/04/2019] Chlorhexidine Gluconate Cloth  6 each Topical Q0600  . clopidogrel  75 mg Oral Daily  . heparin  5,000 Units Subcutaneous Q8H   Continuous Infusions:   LOS: 1 day     Cordelia Poche, MD [facility] 01/03/2019, 1:57 PM  If 7PM-7AM, please contact night-coverage [email or web address]

## 2019-01-03 NOTE — Progress Notes (Signed)
VASCULAR LAB PRELIMINARY  PRELIMINARY  PRELIMINARY  PRELIMINARY  Carotid duplex completed.    Preliminary report:  See CV Proc  Sharion Dove, RVT 01/03/2019, 9:47 AM

## 2019-01-03 NOTE — Progress Notes (Signed)
  Speech Language Pathology Treatment:    Patient Details Name: Alexis Washington MRN: [id number] DOB: 06-19-1932 Today's Date: 01/03/2019 Time: 6389-3734 SLP Time Calculation (min) (ACUTE ONLY): 27 min  Assessment / Plan / Recommendation Clinical Impression  Pt was seen for cognitive-linguistic evaluation today. No family was present to confirm, however pt provided a detailed history, was oriented X4, and based on performance with SLP today she appears at baseline level of cognitive-linguistic functioning. Pt completed generative naming tasks, basic verbal problem solving, as well as sustained and selective attention tasks WFL. Pt required min verbal (category) cues to recall 2/4 word in delayed recall task, however this level of functioning is also reported to be consistent with baseline. Pt has assistance from daughter, husband, and hired help to manage medications, household chores, and other complex ADLs. Given that pt is believed to be at baseline level of cognitive functioning, no ST is indicated at this time. SLP did educate pt regarding options to seek out outpatient ST services if she notices a decline in functioning upon returning home.    HPI HPI: Alexis Washington is a 85 y.o. female with a history of ESRD on dialysis, paroxysmal atrial fibrillation, hypertension, hypothyroidism, and GERD who was transferred to [facility] 01/02/19 for management of ischemic stroke. MRI showed acute 13 mm left frontal white matter small vessel infarct with moderate to severe small vessel changes.      SLP Plan     Patient does not need any further Speech Lanaguage Pathology Services    Recommendations                   Follow up Recommendations: None SLP Visit Diagnosis: Cognitive communication deficit (R41.841)       Jettie Booze, Student SLP                 Jettie Booze 01/03/2019, 2:13 PM

## 2019-01-03 NOTE — Plan of Care (Signed)
Patient states she know she should take all medications as order by her Md and attend dialysis 3 times a week.

## 2019-01-03 NOTE — Progress Notes (Signed)
  Echocardiogram 2D Echocardiogram has been performed.  Matilde Bash 01/03/2019, 10:31 AM

## 2019-01-04 DIAGNOSIS — Z90710 Acquired absence of both cervix and uterus: Secondary | ICD-10-CM | POA: Diagnosis not present

## 2019-01-04 DIAGNOSIS — Z885 Allergy status to narcotic agent status: Secondary | ICD-10-CM | POA: Diagnosis not present

## 2019-01-04 DIAGNOSIS — Z992 Dependence on renal dialysis: Secondary | ICD-10-CM | POA: Diagnosis not present

## 2019-01-04 DIAGNOSIS — E785 Hyperlipidemia, unspecified: Secondary | ICD-10-CM | POA: Diagnosis present

## 2019-01-04 DIAGNOSIS — Q872 Congenital malformation syndromes predominantly involving limbs: Secondary | ICD-10-CM | POA: Diagnosis not present

## 2019-01-04 DIAGNOSIS — R531 Weakness: Secondary | ICD-10-CM | POA: Diagnosis present

## 2019-01-04 DIAGNOSIS — Z7982 Long term (current) use of aspirin: Secondary | ICD-10-CM | POA: Diagnosis not present

## 2019-01-04 DIAGNOSIS — K219 Gastro-esophageal reflux disease without esophagitis: Secondary | ICD-10-CM | POA: Diagnosis present

## 2019-01-04 DIAGNOSIS — I639 Cerebral infarction, unspecified: Secondary | ICD-10-CM | POA: Diagnosis present

## 2019-01-04 DIAGNOSIS — I120 Hypertensive chronic kidney disease with stage 5 chronic kidney disease or end stage renal disease: Secondary | ICD-10-CM | POA: Diagnosis present

## 2019-01-04 DIAGNOSIS — D631 Anemia in chronic kidney disease: Secondary | ICD-10-CM | POA: Diagnosis present

## 2019-01-04 DIAGNOSIS — I739 Peripheral vascular disease, unspecified: Secondary | ICD-10-CM | POA: Diagnosis present

## 2019-01-04 DIAGNOSIS — Z8349 Family history of other endocrine, nutritional and metabolic diseases: Secondary | ICD-10-CM | POA: Diagnosis not present

## 2019-01-04 DIAGNOSIS — J90 Pleural effusion, not elsewhere classified: Secondary | ICD-10-CM | POA: Diagnosis present

## 2019-01-04 DIAGNOSIS — Z8249 Family history of ischemic heart disease and other diseases of the circulatory system: Secondary | ICD-10-CM | POA: Diagnosis not present

## 2019-01-04 DIAGNOSIS — Z888 Allergy status to other drugs, medicaments and biological substances status: Secondary | ICD-10-CM | POA: Diagnosis not present

## 2019-01-04 DIAGNOSIS — J9811 Atelectasis: Secondary | ICD-10-CM | POA: Diagnosis present

## 2019-01-04 DIAGNOSIS — I480 Paroxysmal atrial fibrillation: Secondary | ICD-10-CM | POA: Diagnosis present

## 2019-01-04 DIAGNOSIS — N186 End stage renal disease: Secondary | ICD-10-CM | POA: Diagnosis present

## 2019-01-04 DIAGNOSIS — Z9049 Acquired absence of other specified parts of digestive tract: Secondary | ICD-10-CM | POA: Diagnosis not present

## 2019-01-04 DIAGNOSIS — E039 Hypothyroidism, unspecified: Secondary | ICD-10-CM | POA: Diagnosis present

## 2019-01-04 DIAGNOSIS — N2581 Secondary hyperparathyroidism of renal origin: Secondary | ICD-10-CM | POA: Diagnosis present

## 2019-01-04 DIAGNOSIS — J449 Chronic obstructive pulmonary disease, unspecified: Secondary | ICD-10-CM | POA: Diagnosis present

## 2019-01-04 DIAGNOSIS — H9190 Unspecified hearing loss, unspecified ear: Secondary | ICD-10-CM | POA: Diagnosis present

## 2019-01-04 DIAGNOSIS — E875 Hyperkalemia: Secondary | ICD-10-CM | POA: Diagnosis present

## 2019-01-04 LAB — HEMOGLOBIN A1C
Hgb A1c MFr Bld: 4.3 % — ABNORMAL LOW (ref 4.8–5.6)
MEAN PLASMA GLUCOSE: 77 mg/dL

## 2019-01-04 MED ORDER — ASPIRIN EC 325 MG PO TBEC: 325.0000 mg | DELAYED_RELEASE_TABLET | Freq: Every day | ORAL | Status: DC

## 2019-01-04 MED ORDER — CLOPIDOGREL BISULFATE 75 MG PO TABS: 75.0000 mg | ORAL_TABLET | Freq: Every day | ORAL | Status: DC

## 2019-01-04 MED ORDER — ASPIRIN EC 325 MG PO TBEC: 325.0000 mg | DELAYED_RELEASE_TABLET | Freq: Every day | ORAL | 0 refills | Status: AC

## 2019-01-04 MED ORDER — ASPIRIN EC 81 MG PO TBEC: 81.0000 mg | DELAYED_RELEASE_TABLET | Freq: Every day | ORAL | 0 refills | Status: AC

## 2019-01-04 MED ORDER — ASPIRIN EC 81 MG PO TBEC: 81.0000 mg | DELAYED_RELEASE_TABLET | Freq: Every day | ORAL | Status: DC

## 2019-01-04 MED ORDER — CLOPIDOGREL BISULFATE 75 MG PO TABS: 75.0000 mg | ORAL_TABLET | Freq: Every day | ORAL | 0 refills | Status: AC

## 2019-01-04 NOTE — Plan of Care (Signed)
  Problem: Education: Goal: Knowledge of secondary prevention will improve 01/04/2019 1331 by Linton Flemings, RN Outcome: Adequate for Discharge 01/04/2019 1031 by Linton Flemings, RN Outcome: Progressing Goal: Knowledge of patient specific risk factors addressed and post discharge goals established will improve 01/04/2019 1331 by Linton Flemings, RN Outcome: Adequate for Discharge 01/04/2019 1031 by Linton Flemings, RN Outcome: Progressing Goal: Individualized Educational Video(s) Outcome: Adequate for Discharge

## 2019-01-04 NOTE — Progress Notes (Addendum)
STROKE TEAM PROGRESS NOTE  Patient stable since yesterday. Plans for d.c later today.   Vitals:   01/03/19 2334 01/04/19 0314 01/04/19 0844 01/04/19 1112  BP: (!) 150/56 (!) 148/58 (!) 137/55 (!) 134/46  Pulse: 69 64 68 62  Resp: $Remo'20 18 18 16  'xSkDJ$ Temp: 99.1 F (37.3 C) 98 F (36.7 C) 98.2 F (36.8 C) 98.4 F (36.9 C)  TempSrc: Oral  Oral Oral  SpO2: 94% 99% 95% 93%  Weight:        CBC:  Recent Labs  Lab 01/02/19 2236 01/03/19 1839  WBC 5.6 4.6  HGB 11.8* 11.7*  HCT 37.5 37.7  MCV 91.2 90.8  PLT 104* 123*    Basic Metabolic Panel:  Recent Labs  Lab 01/02/19 2236 01/03/19 1839  NA 136 134*  K 4.6 4.6  CL 97* 96*  CO2 22 22  GLUCOSE 111* 129*  BUN 68* 86*  CREATININE 8.45* 10.10*  CALCIUM 7.5* 7.3*  PHOS  --  8.9*   Lipid Panel:     Component Value Date/Time   CHOL 155 01/03/2019 0645   TRIG 74 01/03/2019 0645   HDL 50 01/03/2019 0645   CHOLHDL 3.1 01/03/2019 0645   VLDL 15 01/03/2019 0645   LDLCALC 90 01/03/2019 0645   HgbA1c:  Lab Results  Component Value Date   HGBA1C 4.3 (L) 01/03/2019   Urine Drug Screen: No results found for: LABOPIA, COCAINSCRNUR, LABBENZ, AMPHETMU, THCU, LABBARB  Alcohol Level No results found for: [place]   Pleasant elderly Caucasian lady not in distress. . Afebrile. Head is nontraumatic. Neck is supple without bruit.    Cardiac exam no murmur or gallop. Lungs are clear to auscultation. Distal pulses are well felt. Neurological Exam ;  Awake  Alert oriented x 3. Normal speech and language.eye movements full without nystagmus.fundi were not visualized. Vision acuity and fields appear normal. Hearing is normal. Palatal movements are normal. Face symmetric. Tongue midline. Normal strength, tone, reflexes and coordination except limited moments and bilateral elbows and shoulders due to fixed flexion contractures.mild weakness of right hip flexors and ankle dorsiflexors 4+/5. Normal sensation. Gait  deferred.   ASSESSMENT/PLAN [name] is a 85 y.o. female with history of ESRD on HD presenting to [facility] with R leg weakness and difficulty ambulating.   Stroke:  left frontal infarct secondary to small vessel disease  in setting of known AF not on anticoagulation due to GI hemorrhage  CT head [place])  No acute stroke. Extensive white matter disease.    MRI  Oval Linsey) L frontal white matter infarct. Mod to severe small vessel disease  TCD to look at intracranial vasculature  pending   Carotid Doppler  B ICA 1-39% stenosis, VAs antegrade   2D Echo  EF > 65%. No source of embolus. RA mildly dilated  LDL 90  HgbA1c 5.3   Heparin 5000 units sq tid for VTE prophylaxis  aspirin 81 mg daily prior to admission, now on aspirin 325 mg daily. Given mild stroke, recommend EC aspirin 81 mg and plavix 75 mg daily x 3 weeks, then aspirin 325 coated alone. Orders adjusted.   Therapy recommendations:  HH PT and HH OT  Disposition: return home today [place] for d/c from stroke standpoint Follow-up Stroke Clinic at [facility] in 4 weeks. Office will call with appointment date and time. Order placed.  Atrial Fibrillation  Home anticoagulation:  none   Not on AC d/t hx GIB last summer, no  source found, but not stable enough for colonoscopy  Not an AC candidate d/t this  Hypertension  Stable . BP goal normotensive  Hyperlipidemia  Home meds:  No statin  Now on lipitor 40  LDL 90, goal < 70  Continue statin at discharge  Other Stroke Risk Factors  Advanced age  Other Active Problems  ESRD on HD MWF [place] with nail-patella syndrome  Hospital day # [place], MSN, APRN, ANVP-BC, AGPCNP-BC Advanced Practice Stroke Nurse [place] for Schedule & Pager information 01/04/2019 11:35 AM  I have personally obtained history,examined this patient, reviewed notes, independently viewed imaging  studies, participated in medical decision making and plan of care.ROS completed by me personally and pertinent positives fully documented  I have made any additions or clarifications directly to the above note. Agree with note above.    Antony Contras, MD Medical Director [facility] St[facility] Pager: [phone] 01/04/2019 1:53 PM   To contact Stroke Continuity provider, please refer to [email or web address] After hours, contact General Neurology

## 2019-01-04 NOTE — Progress Notes (Signed)
Patient is discharged home  With family on home heath. Discharge instruction given to patient's daughter at bedside, education on Plavix and aspirin given,  all questions answered and concerns addressed. Patient/family verbalizes understanding.

## 2019-01-04 NOTE — Discharge Instructions (Addendum)
[place] were in the hospital because of a stroke. Your medications have been adjusted. Please take as prescribed. You will also get home health physical therapy. Please follow-up with the neurologist and your primary care physician.

## 2019-01-04 NOTE — Progress Notes (Signed)
Patient arrived in the room at 2323pm, alert and oriented x4, denied of pain, seems pleasant, VTs are T 99.1, P  69, BP 150/56, R 20, & SO2 94%, resting in a bed, will continue to monitor.

## 2019-01-04 NOTE — NC FL2 (Signed)
[place] MEDICAID FL2 LEVEL OF CARE SCREENING TOOL     IDENTIFICATION  Patient Name: Alexis Washington Birthdate: 1932-10-09 Sex: female Admission Date (Current Location): 01/02/2019  [facility] and [place] Number:  [employer] and Address:  [facility], [place] [address] [place], [place] [address]      Provider Number: [id number]  Attending Physician Name and Address:  Mariel Aloe, MD  Relative Name and Phone Number:       Current Level of Care: Hospital Recommended Level of Care: [place] Prior Approval Number:    Date Approved/Denied:   PASRR Number: [id number]  Discharge Plan: SNF    Current Diagnoses: Patient Active Problem List   Diagnosis Date Noted  . CVA (cerebral vascular accident) ([place]) 01/04/2019  . AF (paroxysmal atrial fibrillation) ([place]) 01/03/2019  . Acute CVA (cerebrovascular accident) ([place]) 01/02/2019  . Duodenal arteriovenous malformation   . Paroxysmal atrial fibrillation with rapid ventricular response ([place])   . Anemia   . GI bleed 04/13/2018  . Hypertensive renal disease 04/04/2018  . Volume overload 11/08/2017  . Acute respiratory failure with hypoxia ([place]) 05/09/2017  . Hypothyroidism 05/09/2017  . Hypertension   . GERD (gastroesophageal reflux disease)   . Anemia in chronic renal disease   . Hypoxia   . Pleural effusion   . Chest pain 01/04/2017  . Pedal edema 01/04/2017  . ESRD on dialysis ([place]) 08/07/2015  . Pre-op evaluation 02/26/2015  . Iron deficiency 02/04/2015  . 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    Orientation RESPIRATION BLADDER Height & Weight     Self, Time, Situation, Place  Normal Continent(Oliguria) Weight: 52.8 kg Height:     BEHAVIORAL SYMPTOMS/MOOD NEUROLOGICAL BOWEL NUTRITION STATUS      Continent Diet(Renal)  AMBULATORY STATUS COMMUNICATION OF NEEDS Skin   Limited Assist Verbally Normal                       Personal  Care Assistance Level of Assistance  Bathing, Feeding, Dressing Bathing Assistance: Limited assistance Feeding assistance: Independent Dressing Assistance: Limited assistance     Functional Limitations Info  Sight, Hearing, Speech Sight Info: Impaired Hearing Info: Impaired Speech Info: Adequate    SPECIAL CARE FACTORS FREQUENCY  PT (By licensed PT), OT (By licensed OT)     PT Frequency: 5x/wk OT Frequency: 5x/wk            Contractures Contractures Info: Not present    Additional Factors Info  Code Status, Allergies Code Status Info: full Allergies Info: Codeine and Novacaine           Current Medications (01/04/2019):  This is the current hospital active medication list Current Facility-Administered Medications  Medication Dose Route Frequency Provider Last Rate Last Dose  . acetaminophen (TYLENOL) tablet 650 mg  650 mg Oral Q4H PRN Shela Leff, MD   650 mg at 01/03/19 2348   Or  . acetaminophen (TYLENOL) solution 650 mg  650 mg Per Tube Q4H PRN Shela Leff, MD       Or  . acetaminophen (TYLENOL) suppository 650 mg  650 mg Rectal Q4H PRN Shela Leff, MD      . Derrill Memo ON 01/26/2019] aspirin EC tablet 325 mg  325 mg Oral Daily Donzetta Starch, NP      . Derrill Memo ON 01/05/2019] aspirin EC tablet 81 mg  81 mg Oral Daily Donzetta Starch, NP      .  atorvastatin (LIPITOR) tablet 40 mg  40 mg Oral q1800 Shela Leff, MD   40 mg at 01/03/19 1708  . carvedilol (COREG) tablet 25 mg  25 mg Oral BID Mariel Aloe, MD   25 mg at 01/04/19 0934  . Chlorhexidine Gluconate Cloth 2 % PADS 6 each  6 each Topical Q0600 Janalee Dane, PA-C      . [START ON 01/05/2019] clopidogrel (PLAVIX) tablet 75 mg  75 mg Oral Daily Burnetta Sabin L, NP      . heparin injection 5,000 Units  5,000 Units Subcutaneous Q8H Shela Leff, MD   5,000 Units at 01/04/19 0559  . senna-docusate (Senokot-S) tablet 1 tablet  1 tablet Oral QHS PRN Shela Leff, MD      .  simethicone (MYLICON) chewable tablet 80 mg  80 mg Oral QID PRN Shela Leff, MD   80 mg at 01/02/19 2326     Discharge Medications: Please see discharge summary for a list of discharge medications.  Relevant Imaging Results:  Relevant Lab Results:   Additional Information SS#: [id number]  Pollie Friar, RN

## 2019-01-04 NOTE — Discharge Summary (Signed)
Physician Discharge Summary  Alexis Washington BSW:[id number] DOB: 18-Oct-1932 DOA: 01/02/2019  PCP: Rochel Brome, MD  Admit date: 01/02/2019 Discharge date: 01/04/2019  Admitted From: Home Disposition: Home  Recommendations for Outpatient Follow-up:  1. Follow up with PCP in 1 week 2. Follow up with neurology in 4 weeks 3. Lipitor called into pharmacy 4. Please obtain BMP/CBC in one week 5. Please follow up on the following pending results: Intracranial doppler  Home Health: PT, OT Equipment/Devices: None  Discharge Condition: Stable CODE STATUS: Full code Diet recommendation: Heart healthy   Brief/Interim Summary:  Admission HPI written by Shela Leff, MD   Chief Complaint: Right leg weakness  HPI: Alexis Washington is a 85 y.o. female with medical history significant of ESRD on hemodialysis MWF, paroxysmal atrial fibrillation, hypertension, hypothyroidism, GERD presenting as a transfer from [place] ED for further management of ischemic stroke.  Patient states when she woke up in the morning on 2/19 her " right foot was glued to the floor."  She had trouble moving her right leg and walking.  Reports having problems with balance and walking from the day prior.  Denies having any headache, slurring of speech, numbness, or tingling.  Family did not notice any drooping of her face.  Denies prior history of stroke.  Denies history of smoking.  States her last dialysis was 2/17.  She takes a baby aspirin daily.  ED Course:   Head CT without contrast reading per radiologist: "No acute intracranial pathology.  No noncontrast evidence of acute stroke or hemorrhage.  Extensive periventricular white matter hypodensity, increased compared to remote prior examination on dated 2010.  Consider MRI to more specifically evaluate for acute diffusion restriction infarction if suspected."  Head MRI without contrast reading per radiologist: "1.  Acute 13 mm left frontal white matter small  vessel infarct. 2.  Moderate to severe chronic small vessel ischemic changes."  Chest x-ray reading per radiologist: "COPD changes with small left pleural effusion and left basilar atelectasis."    Hospital course:  Acute CVA MRI from outside hospital significant for acute left frontal small vessel infarct. Neurology consulted. Started on full dose aspirin and Plavix. LDL of 90, started on atorvastatin 40 mg. Hemoglobin A1C pending. Transthoracic Echocardiogram significant for no thrombus. Carotid doppler with 1-39% stenosis. Lipitor called into pharmacy after discharge. Discharged on aspirin 81 mg and Plavix 75 mg daily for the next three weeks followed by aspirin 325 mg EC alone.  ESRD On HD MWF. Did not have HD on Wednesday. Had HD on on 2/20 evening. Recommend HD on Saturday, 2/22, then resume previous schedule  Essential hypertension Continue Coreg  Paroxysmal atrial fibrillation Continue Coreg  Chronic normocytic anemia In setting of chronic disease. Currently stable.  Discharge Diagnoses:  Principal Problem:   Acute CVA (cerebrovascular accident) [facility]) Active Problems:   ESRD on dialysis ([place])   Hypertension   Anemia   AF (paroxysmal atrial fibrillation) ([place])   CVA (cerebral vascular accident) [facility])    Discharge Instructions  Discharge Instructions    Ambulatory referral to Neurology   Complete by:  As directed    Follow up with stroke clinic NP (Jessica Vanschaick or Cecille Rubin, if both not available, consider Dr. Antony Contras, Dr. Bess Harvest, or Dr. Sarina Ill) at [facility] in about 4 weeks.   Diet - low sodium heart healthy   Complete by:  As directed    Increase activity slowly   Complete by:  As directed  Allergies as of 01/04/2019      Reactions   Codeine Nausea And Vomiting   Novocain [procaine] Palpitations      Medication List    TAKE these medications   acetaminophen 500 MG tablet Commonly known as:   TYLENOL Take 500 mg by mouth every 8 (eight) hours as needed for mild pain.   aspirin EC 81 MG tablet Take 1 tablet (81 mg total) by mouth daily for 21 days. Start taking on:  January 05, 2019 What changed:  Another medication with the same name was added. Make sure you understand how and when to take each.   aspirin EC 325 MG tablet Take 1 tablet (325 mg total) by mouth daily. Start on 02/05/2019 Start taking on:  February 05, 2019 What changed:  You were already taking a medication with the same name, and this prescription was added. Make sure you understand how and when to take each.   calcium acetate 667 MG capsule Commonly known as:  PHOSLO Take 1,334 mg by mouth 3 (three) times daily.   carvedilol 25 MG tablet Commonly known as:  COREG Take 1 tablet (25 mg total) by mouth 2 (two) times daily.   clopidogrel 75 MG tablet Commonly known as:  PLAVIX Take 1 tablet (75 mg total) by mouth daily for 21 days. Start taking on:  January 05, 2019   CORICIDIN HBP COLD/FLU PO Take 1 tablet by mouth as needed (cold symptoms).   DIGESTIVE ADVANTAGE Caps Take 1 capsule by mouth daily.   docusate sodium 100 MG capsule Commonly known as:  COLACE Take 1 capsule (100 mg total) by mouth 2 (two) times daily as needed for mild constipation.   fluticasone 50 MCG/ACT nasal spray Commonly known as:  FLONASE Place 1 spray into both nostrils as needed for allergies or rhinitis.   folic acid-vitamin b complex-vitamin c-selenium-zinc 3 MG Tabs tablet Take 1 tablet by mouth daily.   levothyroxine 150 MCG tablet Commonly known as:  SYNTHROID, LEVOTHROID Take 150 mcg by mouth daily.   Lidocaine-Prilocaine (Bulk) 2.5-2.5 % Crea Apply 1 application topically as needed (before dialysis).   nitroGLYCERIN 0.4 MG SL tablet Commonly known as:  NITROSTAT Place 1 tablet (0.4 mg total) under the tongue every 5 (five) minutes as needed.   omeprazole 40 MG capsule Commonly known as:  PRILOSEC Take 40  mg by mouth daily.   OVER THE COUNTER MEDICATION Place 1 drop into both eyes daily as needed (allergy symptoms). Allergy Relief eye drops   polyethylene glycol packet Commonly known as:  MIRALAX / GLYCOLAX Take 17 g by mouth daily as needed. What changed:  reasons to take this   raloxifene 60 MG tablet Commonly known as:  EVISTA Take 60 mg by mouth daily.     Lipitor 40 mg tablet daily (Called into pharmacy on 01/04/2019 at 5:30 PM)   Follow-up Information    [facility] Follow up in 4 week(s).   Specialty:  Neurology Why:  stroke clinic. office will call with appt date and time. Contact information: [address] [place] [phone]       Rochel Brome, MD. Schedule an appointment as soon as possible for a visit in 1 week(s).   Specialties:  Family Medicine, Interventional Cardiology, Radiology, Anesthesiology Contact information: [address] [place] [address] [phone]          Allergies  Allergen Reactions  . Codeine Nausea And Vomiting  .  Novocain [Procaine] Palpitations    Consultations:  Neurology   Procedures/Studies: Vas US Carotid (at [facility])  Result Date: 01/03/2019 Carotid Arterial Duplex Study Indications:       CVA and Weakness. Risk Factors:      Hypertension. Other Factors:     ESRD, on dialysis. Comparison Study:  No prior study on file Performing Technologist: Sherren Kerns RVS  Examination Guidelines: A complete evaluation includes B-mode imaging, spectral Doppler, color Doppler, and power Doppler as needed of all accessible portions of each vessel. Bilateral testing is considered an integral part of a complete examination. Limited examinations for reoccurring indications may be performed as noted.  Right Carotid Findings: +----------+--------+--------+--------+------------+---------+           PSV cm/sEDV cm/sStenosisDescribe    Comments   +----------+--------+--------+--------+------------+---------+ CCA Prox  86      7                                     +----------+--------+--------+--------+------------+---------+ CCA Distal60      7                                     +----------+--------+--------+--------+------------+---------+ ICA Prox  65      10              heterogenousShadowing +----------+--------+--------+--------+------------+---------+ ICA Distal96      15                                    +----------+--------+--------+--------+------------+---------+ ECA       122     4                                     +----------+--------+--------+--------+------------+---------+ +----------+--------+-------+--------+-------------------+           PSV cm/sEDV cmsDescribeArm Pressure (mmHG) +----------+--------+-------+--------+-------------------+ [id number]                                        +----------+--------+-------+--------+-------------------+ +---------+--------+--+--------+-+ VertebralPSV cm/s46EDV cm/s8 +---------+--------+--+--------+-+  Left Carotid Findings: +----------+--------+--------+--------+--------+------------------+           PSV cm/sEDV cm/sStenosisDescribeComments           +----------+--------+--------+--------+--------+------------------+ CCA Prox  85      8                       intimal thickening +----------+--------+--------+--------+--------+------------------+ CCA Distal67      7                       intimal thickening +----------+--------+--------+--------+--------+------------------+ ICA Prox  153     19              calcific                   +----------+--------+--------+--------+--------+------------------+ ICA Mid                                   tortuous           +----------+--------+--------+--------+--------+------------------+  ICA Distal99      18                                          +----------+--------+--------+--------+--------+------------------+ ECA       282     17                                         +----------+--------+--------+--------+--------+------------------+ +----------+--------+--------+--------+-------------------+ SubclavianPSV cm/sEDV cm/sDescribeArm Pressure (mmHG) +----------+--------+--------+--------+-------------------+           109                                         +----------+--------+--------+--------+-------------------+ +---------+--------+--+--------+--+ VertebralPSV cm/s54EDV cm/s10 +---------+--------+--+--------+--+  Summary: Right Carotid: Velocities in the right ICA are consistent with a 1-39% stenosis. Left Carotid: Velocities in the left ICA are consistent with a 1-39% stenosis. Vertebrals:  Bilateral vertebral arteries demonstrate antegrade flow. Subclavians: Normal flow hemodynamics were seen in bilateral subclavian              arteries. *See table(s) above for measurements and observations.  Electronically signed by Antony Contras MD on 01/03/2019 at 11:57:10 AM.    Final    Vas [place] Transcranial Doppler  Result Date: 01/04/2019  Transcranial Doppler Indications: Stroke. Limitations for diagnostic windows: Unable to insonate left transtemporal window. Unable to insonate occipital window. Performing Technologist: Maudry Mayhew MHA, RDMS, RVT, RDCS  Examination Guidelines: A complete evaluation includes B-mode imaging, spectral Doppler, color Doppler, and power Doppler as needed of all accessible portions of each vessel. Bilateral testing is considered an integral part of a complete examination. Limited examinations for reoccurring indications may be performed as noted.  +----------+-------------+----------+-----------+------------------+ RIGHT TCD Right VM (cm)Depth (cm)Pulsatility     Comment       +----------+-------------+----------+-----------+------------------+ MCA           39.00                 1.91                        +----------+-------------+----------+-----------+------------------+ ACA          -15.00                 1.01                       +----------+-------------+----------+-----------+------------------+ Term ICA      35.00                 1.91                       +----------+-------------+----------+-----------+------------------+ PCA           13.00                  0.6                       +----------+-------------+----------+-----------+------------------+ Opthalmic     24.00                 1.33                       +----------+-------------+----------+-----------+------------------+  ICA siphon                                  Unable to insonate +----------+-------------+----------+-----------+------------------+ Vertebral    -16.00                 1.25                       +----------+-------------+----------+-----------+------------------+  +----------+------------+----------+-----------+------------------+ LEFT TCD  Left VM (cm)Depth (cm)Pulsatility     Comment       +----------+------------+----------+-----------+------------------+ MCA                                        Unable to insonate +----------+------------+----------+-----------+------------------+ ACA                                        Unable to insonate +----------+------------+----------+-----------+------------------+ Term ICA                                   Unable to insonate +----------+------------+----------+-----------+------------------+ PCA                                        Unable to insonate +----------+------------+----------+-----------+------------------+ Opthalmic    20.00                 1.88                       +----------+------------+----------+-----------+------------------+ ICA siphon                                 Unable to insonate +----------+------------+----------+-----------+------------------+  Vertebral                                  Unable to insonate +----------+------------+----------+-----------+------------------+  +-------+-------+------------------+        VM cm/s     Comment       +-------+-------+------------------+ Basilar       Unable to insonate +-------+-------+------------------+ Summary:  Absent left temporal and poor occipital window limits evaluation. Low normal mean flow velocities in identified vessels of anterior and posterior circulation. Globally elevated pulsatility indexes suggestive of diffuse intracranial atherosclerosis likely *See table(s) above for measurements and observations.  Diagnosing physician: Antony Contras MD Electronically signed by Antony Contras MD on 01/04/2019 at 12:59:23 PM.    Final      Transthoracic Echocardiogram (01/03/2019) IMPRESSIONS    1. The left ventricle has hyperdynamic systolic function, with an ejection fraction of >65%. The cavity size was normal. Left ventricular diastolic Doppler parameters are consistent with pseudonormalization.  2. The right ventricle has normal systolic function. The cavity was normal. There is no increase in right ventricular wall thickness.  3. Right atrial size was mildly dilated.  4. The mitral valve is normal in structure. There is moderate mitral annular calcification present.  5. The tricuspid valve is normal in structure.  6. The aortic valve is tricuspid Moderate thickening of  the aortic valve Severe calcifcation of the aortic valve.  7. Non coronary cusp with severe thickness/ calcification.  8. The pulmonic valve was normal in structure.  9. Pulmonary hypertension is mild.   Subjective: No concerns today. Hoping she is getting stronger.  Discharge Exam: Vitals:   01/04/19 0844 01/04/19 1112  BP: (!) 137/55 (!) 134/46  Pulse: 68 62  Resp: 18 16  Temp: 98.2 F (36.8 C) 98.4 F (36.9 C)  SpO2: 95% 93%   Vitals:   01/03/19 2334 01/04/19 0314 01/04/19 0844 01/04/19 1112    BP: (!) 150/56 (!) 148/58 (!) 137/55 (!) 134/46  Pulse: 69 64 68 62  Resp: $Remo'20 18 18 16  'vgDtz$ Temp: 99.1 F (37.3 C) 98 F (36.7 C) 98.2 F (36.8 C) 98.4 F (36.9 C)  TempSrc: Oral  Oral Oral  SpO2: 94% 99% 95% 93%  Weight:        General: Pt is alert, awake, not in acute distress Cardiovascular: RRR, S1/S2 +, no rubs, no gallops Respiratory: CTA bilaterally, no wheezing, no rhonchi Abdominal: Soft, NT, ND, bowel sounds + Extremities: no edema, no cyanosis Neuro: right lower extremity 4/5 compared to left of 5/5    The results of significant diagnostics from this hospitalization (including imaging, microbiology, ancillary and laboratory) are listed below for reference.     Microbiology: No results found for this or any previous visit (from the past 240 hour(s)).   Labs: BNP (last 3 results) No results for input(s): BNP in the last 8760 hours. Basic Metabolic Panel: Recent Labs  Lab 01/02/19 2236 01/03/19 1839  NA 136 134*  K 4.6 4.6  CL 97* 96*  CO2 22 22  GLUCOSE 111* 129*  BUN 68* 86*  CREATININE 8.45* 10.10*  CALCIUM 7.5* 7.3*  PHOS  --  8.9*   Liver Function Tests: Recent Labs  Lab 01/02/19 2236 01/03/19 1839  AST 20  --   ALT 20  --   ALKPHOS 51  --   BILITOT 0.7  --   PROT 5.6*  --   ALBUMIN 3.1* 3.1*   No results for input(s): LIPASE, AMYLASE in the last 168 hours. No results for input(s): AMMONIA in the last 168 hours. CBC: Recent Labs  Lab 01/02/19 2236 01/03/19 1839  WBC 5.6 4.6  HGB 11.8* 11.7*  HCT 37.5 37.7  MCV 91.2 90.8  PLT 104* 123*   Cardiac Enzymes: Recent Labs  Lab 01/02/19 2236  TROPONINI <0.03   BNP: Invalid input(s): POCBNP CBG: Recent Labs  Lab 01/03/19 0606  GLUCAP 119*   Hgb A1c Recent Labs    01/03/19 0645  HGBA1C 4.3*   Lipid Profile Recent Labs    01/03/19 0645  CHOL 155  HDL 50  LDLCALC 90  TRIG 74  CHOLHDL 3.1     SIGNED:   Cordelia Poche, MD [facility] 01/04/2019, 5:24 PM

## 2019-01-04 NOTE — Care Management Note (Signed)
Case Management Note  Patient Details  Name: Alexis Washington MRN: [id number] Date of Birth: Feb 15, 1932  Subjective/Objective:   Pt admitted with a stroke. She is from home with spouse that can provide 24 hour supervision. Daughter also lives across the street.  DME: walker No issues obtaining her meds No issues with transportation.                 Action/Plan: Pt discharging with orders for [facility] services. CM provided choice and they selected [facility]. Dorian Pod with [facility] notified and accepted the referral. Pt has transport home.  Expected Discharge Date:  01/04/19               Expected Discharge Plan:  [place]  In-House Referral:     Discharge planning Services  CM Consult  Post Acute Care Choice:  Home Health Choice offered to:  Patient  DME Arranged:    DME Agency:     HH Arranged:  PT, OT HH Agency:  [facility]  Status of Service:  Completed, signed off  If discussed at [place] of Stay Meetings, dates discussed:    Additional Comments:  Pollie Friar, RN 01/04/2019, 1:17 PM

## 2019-01-04 NOTE — Progress Notes (Signed)
Physical Therapy Treatment Patient Details Name: Alexis Washington MRN: [id number] DOB: October 27, 1932 Today's Date: 01/04/2019    History of Present Illness Alexis Washington is a 85 y.o. female with a history of ESRD on dialysis, paroxysmal atrial fibrillation, hypertension, hypothyroidism, and GERD who was transferred to [facility] 01/02/19 for management of ischemic stroke. MRI showed acute 13 mm left frontal white matter small vessel infarct with moderate to severe small vessel changes.    PT Comments    Family education.  Emphasis on use of the pt's platform rollator, accomplished stairs.  Discussed progression of activity and therapy direction as of discharge.  I have discussed the patient's current level of function related to pt's stroke  with the patient and family.  They acknowledge understanding of this, wish  Pt would chose SNF therapies, but feel they can attempt to provide the level of care the patient will need at home.   Basic education completed this session.    Follow Up Recommendations  Home health PT;Supervision/Assistance - 24 hour;Supervision for mobility/OOB     Equipment Recommendations  None recommended by PT    Recommendations for Other Services       Precautions / Restrictions Precautions Precautions: Fall Restrictions Weight Bearing Restrictions: No    Mobility  Bed Mobility Overal bed mobility: Needs Assistance Bed Mobility: Supine to Sit     Supine to sit: Min assist        Transfers Overall transfer level: Needs assistance   Transfers: Sit to/from Stand Sit to Stand: Min assist;Mod assist Stand pivot transfers: Min assist       General transfer comment: pt needing more assist the lower the surface  Ambulation/Gait Ambulation/Gait assistance: Min assist Gait Distance (Feet): 25 Feet(6 x2) Assistive device: 4-wheeled walker;Bilateral platform walker Gait Pattern/deviations: Step-to pattern;Step-through pattern   Gait velocity interpretation: <1.31  ft/sec, indicative of household ambulator General Gait Details: pt's upright platform rollator does not give pt enough control given her HP gait pattern.   Stairs Stairs: Yes Stairs assistance: Mod assist Stair Management: No rails;One rail Left;Step to pattern;Forwards Number of Stairs: 2 General stair comments: cues and assist for approp technique, but pt still needed mod assist to accomplish the steps.   Wheelchair Mobility    Modified Rankin (Stroke Patients Only) Modified Rankin (Stroke Patients Only) Pre-Morbid Rankin Score: No significant disability Modified Rankin: Moderately severe disability     Balance Overall balance assessment: Needs assistance Sitting-balance support: Feet supported Sitting balance-Leahy Scale: Fair     Standing balance support: During functional activity Standing balance-Leahy Scale: Poor Standing balance comment: min A from therapist                            Cognition Arousal/Alertness: Awake/alert Behavior During Therapy: WFL for tasks assessed/performed Overall Cognitive Status: Within Functional Limits for tasks assessed                                        Exercises      General Comments General comments (skin integrity, edema, etc.): Extensive discussion on regular platform RW vs pt's rollator for improved control, stair instruction, progression of activity and progression that HHPT should go.      Pertinent Vitals/Pain Pain Assessment: Faces Faces Pain Scale: No hurt    Home Living  Prior Function            PT Goals (current goals can now be found in the care plan section) Acute Rehab PT Goals Patient Stated Goal: to go home PT Goal Formulation: With patient Time For Goal Achievement: 01/10/19 Potential to Achieve Goals: Good Progress towards PT goals: Progressing toward goals    Frequency           PT Plan Current plan remains appropriate     Co-evaluation              AM-PAC PT "6 Clicks" Mobility   Outcome Measure  Help needed turning from your back to your side while in a flat bed without using bedrails?: A Little Help needed moving from lying on your back to sitting on the side of a flat bed without using bedrails?: A Little Help needed moving to and from a bed to a chair (including a wheelchair)?: A Little Help needed standing up from a chair using your arms (e.g., wheelchair or bedside chair)?: A Little Help needed to walk in hospital room?: A Little Help needed climbing 3-5 steps with a railing? : A Lot 6 Click Score: 17    End of Session   Activity Tolerance: Patient tolerated treatment well;Patient limited by fatigue Patient left: in bed;with call bell/phone within reach;with bed alarm set Nurse Communication: Mobility status PT Visit Diagnosis: Unsteadiness on feet (R26.81);Other abnormalities of gait and mobility (R26.89);Muscle weakness (generalized) (M62.81);Hemiplegia and hemiparesis Hemiplegia - Right/Left: Right Hemiplegia - dominant/non-dominant: Dominant Hemiplegia - caused by: Cerebral infarction     Time: 1332-1436 PT Time Calculation (min) (ACUTE ONLY): 64 min  Charges:  $Gait Training: 8-22 mins $Therapeutic Activity: 23-37 mins $Self Care/Home Management: 8-22                     01/04/2019  Donnella Sham, PT Acute Rehabilitation Services [phone]  (pager) [phone]  (office)   Alexis Washington 01/04/2019, 2:43 PM

## 2019-01-04 NOTE — NC FL2 (Signed)
[place] MEDICAID FL2 LEVEL OF CARE SCREENING TOOL     IDENTIFICATION  Patient Name: Alexis Washington Birthdate: 16-May-1932 Sex: female Admission Date (Current Location): 01/02/2019  [facility] and [place] Number:  [employer] and Address:  The [place], [place] [address] [place], [place] [address]      Provider Number: [id number]  Attending Physician Name and Address:  Mariel Aloe, MD  Relative Name and Phone Number:       Current Level of Care: Hospital Recommended Level of Care: [place] Prior Approval Number:    Date Approved/Denied:   PASRR Number: [id number]  Discharge Plan: SNF    Current Diagnoses: Patient Active Problem List   Diagnosis Date Noted  . CVA (cerebral vascular accident) ([place]) 01/04/2019  . AF (paroxysmal atrial fibrillation) ([place]) 01/03/2019  . Acute CVA (cerebrovascular accident) ([place]) 01/02/2019  . Duodenal arteriovenous malformation   . Paroxysmal atrial fibrillation with rapid ventricular response ([place])   . Anemia   . GI bleed 04/13/2018  . Hypertensive renal disease 04/04/2018  . Volume overload 11/08/2017  . Acute respiratory failure with hypoxia ([place]) 05/09/2017  . Hypothyroidism 05/09/2017  . Hypertension   . GERD (gastroesophageal reflux disease)   . Anemia in chronic renal disease   . Hypoxia   . Pleural effusion   . Chest pain 01/04/2017  . Pedal edema 01/04/2017  . ESRD on dialysis ([place]) 08/07/2015  . Pre-op evaluation 02/26/2015  . Iron deficiency 02/04/2015  . 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    Orientation RESPIRATION BLADDER Height & Weight     Self, Time, Situation, Place  Normal Continent(Oliguria) Weight: 52.8 kg Height:     BEHAVIORAL SYMPTOMS/MOOD NEUROLOGICAL BOWEL NUTRITION STATUS      Continent Diet(Renal)  AMBULATORY STATUS COMMUNICATION OF NEEDS Skin   Limited Assist Verbally Normal                       Personal  Care Assistance Level of Assistance  Bathing, Feeding, Dressing Bathing Assistance: Limited assistance Feeding assistance: Independent Dressing Assistance: Limited assistance     Functional Limitations Info  Sight, Hearing, Speech Sight Info: Impaired Hearing Info: Impaired Speech Info: Adequate    SPECIAL CARE FACTORS FREQUENCY  PT (By licensed PT), OT (By licensed OT)     PT Frequency: 5x/wk OT Frequency: 5x/wk            Contractures Contractures Info: Not present    Additional Factors Info  Code Status, Allergies Code Status Info: full Allergies Info: Codeine and Novacaine           Current Medications (01/04/2019):  This is the current hospital active medication list Current Facility-Administered Medications  Medication Dose Route Frequency Provider Last Rate Last Dose  . acetaminophen (TYLENOL) tablet 650 mg  650 mg Oral Q4H PRN Shela Leff, MD   650 mg at 01/03/19 2348   Or  . acetaminophen (TYLENOL) solution 650 mg  650 mg Per Tube Q4H PRN Shela Leff, MD       Or  . acetaminophen (TYLENOL) suppository 650 mg  650 mg Rectal Q4H PRN Shela Leff, MD      . Derrill Memo ON 01/26/2019] aspirin EC tablet 325 mg  325 mg Oral Daily Donzetta Starch, NP      . Derrill Memo ON 01/05/2019] aspirin EC tablet 81 mg  81 mg Oral Daily Donzetta Starch, NP      .  atorvastatin (LIPITOR) tablet 40 mg  40 mg Oral q1800 Shela Leff, MD   40 mg at 01/03/19 1708  . carvedilol (COREG) tablet 25 mg  25 mg Oral BID Mariel Aloe, MD   25 mg at 01/04/19 0934  . Chlorhexidine Gluconate Cloth 2 % PADS 6 each  6 each Topical Q0600 Janalee Dane, PA-C      . [START ON 01/05/2019] clopidogrel (PLAVIX) tablet 75 mg  75 mg Oral Daily Burnetta Sabin L, NP      . heparin injection 5,000 Units  5,000 Units Subcutaneous Q8H Shela Leff, MD   5,000 Units at 01/04/19 0559  . senna-docusate (Senokot-S) tablet 1 tablet  1 tablet Oral QHS PRN Shela Leff, MD      .  simethicone (MYLICON) chewable tablet 80 mg  80 mg Oral QID PRN Shela Leff, MD   80 mg at 01/02/19 2326     Discharge Medications: Please see discharge summary for a list of discharge medications.  Relevant Imaging Results:  Relevant Lab Results:   Additional Information Patient is HD MWF at [facility], RN

## 2019-01-04 NOTE — Plan of Care (Signed)
  Problem: Education: Goal: Knowledge of secondary prevention will improve Outcome: Progressing Goal: Knowledge of patient specific risk factors addressed and post discharge goals established will improve Outcome: Progressing

## 2019-01-04 NOTE — Progress Notes (Signed)
[facility] Progress Note  Subjective: doing well, R leg weak, no other c/o's. prob going home today.   Vitals:   01/03/19 2334 01/04/19 0314 01/04/19 0844 01/04/19 1112  BP: (!) 150/56 (!) 148/58 (!) 137/55 (!) 134/46  Pulse: 69 64 68 62  Resp: $Remo'20 18 18 16  'uIXma$ Temp: 99.1 F (37.3 C) 98 F (36.7 C) 98.2 F (36.8 C) 98.4 F (36.9 C)  TempSrc: Oral  Oral Oral  SpO2: 94% 99% 95% 93%  Weight:        Inpatient medications: . [START ON 01/26/2019] aspirin EC  325 mg Oral Daily  . [START ON 01/05/2019] aspirin EC  81 mg Oral Daily  . atorvastatin  40 mg Oral q1800  . carvedilol  25 mg Oral BID  . Chlorhexidine Gluconate Cloth  6 each Topical Q0600  . [START ON 01/05/2019] clopidogrel  75 mg Oral Daily  . heparin  5,000 Units Subcutaneous Q8H    acetaminophen **OR** acetaminophen (TYLENOL) oral liquid 160 mg/5 mL **OR** acetaminophen, senna-docusate, simethicone  Iron/TIBC/Ferritin/ %Sat    Component Value Date/Time   IRON 29 04/16/2018 0816   TIBC 164 (L) 04/16/2018 0816   FERRITIN 780 (H) 04/16/2018 0816   IRONPCTSAT 18 04/16/2018 0816    Exam: General: Well developed, well nourished, in no acute distress. Lungs: CTA without wheezes, rales, or rhonchi. Heart: RRR with normal S1, S2. No murmurs, rubs, or gallops appreciated. Abdomen: Soft, non-tender, non-distended. Normoactive bowel sounds Lower extremities: No edema or ischemic changes, no open wounds. Neuro: Alert and oriented X 3. + R leg weakness Psych:  Responds to questions appropriately with a normal affect. Dialysis Access: RUE AVF, + thrill         Dialysis Orders: Center: [facility]  on MWF. 3.0 K, 2.25 Ca 180NRe Optiflux, Time: 4:15 hours  RUE AVF BFR 350, DFR 800 EDW 52.5kg Heparin: 1300 units IV bolus Calcitriol: 0.77mcg PO 3x week during dialysis Binder: Calcium acetate 2 caps PO TID with meals ESA: none  Recent labs:  12/26/2018: K 4.7, Hgb 11.4. 12/05/2018: Tsat 40.0, Ca 8.2, Phos 5.3,  PTH 365.0  Assessment/Plan: 1. Acute frontal L CVA: w RLE weakness. MRI + L frontal infarct w/mod to severe small vessel disease: Carotid doppler showed B ICA 1-39% stenosis, 2D echo pending. Conservative management per stroke team. Neuro recommending aspirin and Plavix for 3 weeks then aspirin 325 alone.  2. Atrial fibrillation: Not on anticoagulation due to history of GI bleed. Per primary team. 3.  ESRD:  Dialyzes MWF. Had HD here late last night. Does not need HD today, she will run tomorrow (Sat) at outpatient unit if dc'd, here if not.  4.  Hypertension/volume: Continue home medications 5.  Anemia: Hemoglobin ok, not on ESA.  6.  Metabolic bone disease: Corrected calcium ok, phos pending. Continue calcitriol and calcium acetate as binder.  7.  Nutrition:  Albumin low, will start pro-stat supplement.  8. Hyperlipidemia: On statin   [facility] 01/04/2019, 12:52 PM  Recent Labs  Lab 01/02/19 2236 01/03/19 1839  NA 136 134*  K 4.6 4.6  CL 97* 96*  CO2 22 22  GLUCOSE 111* 129*  BUN 68* 86*  CREATININE 8.45* 10.10*  CALCIUM 7.5* 7.3*  PHOS  --  8.9*  ALBUMIN 3.1* 3.1*  INR 1.27  --    Recent Labs  Lab 01/02/19 2236  AST 20  ALT 20  ALKPHOS 51  BILITOT 0.7  PROT 5.6*   Recent  Labs  Lab 01/02/19 2236 01/03/19 1839  WBC 5.6 4.6  HGB 11.8* 11.7*  HCT 37.5 37.7  MCV 91.2 90.8  PLT 104* 123*

## 2019-01-06 DIAGNOSIS — K219 Gastro-esophageal reflux disease without esophagitis: Secondary | ICD-10-CM | POA: Diagnosis not present

## 2019-01-06 DIAGNOSIS — Z7951 Long term (current) use of inhaled steroids: Secondary | ICD-10-CM | POA: Diagnosis not present

## 2019-01-06 DIAGNOSIS — I120 Hypertensive chronic kidney disease with stage 5 chronic kidney disease or end stage renal disease: Secondary | ICD-10-CM | POA: Diagnosis not present

## 2019-01-06 DIAGNOSIS — E039 Hypothyroidism, unspecified: Secondary | ICD-10-CM | POA: Diagnosis not present

## 2019-01-06 DIAGNOSIS — I69341 Monoplegia of lower limb following cerebral infarction affecting right dominant side: Secondary | ICD-10-CM | POA: Diagnosis not present

## 2019-01-06 DIAGNOSIS — N186 End stage renal disease: Secondary | ICD-10-CM | POA: Diagnosis not present

## 2019-01-06 DIAGNOSIS — M1990 Unspecified osteoarthritis, unspecified site: Secondary | ICD-10-CM | POA: Diagnosis not present

## 2019-01-06 DIAGNOSIS — Z9981 Dependence on supplemental oxygen: Secondary | ICD-10-CM | POA: Diagnosis not present

## 2019-01-06 DIAGNOSIS — Z992 Dependence on renal dialysis: Secondary | ICD-10-CM | POA: Diagnosis not present

## 2019-01-06 DIAGNOSIS — Z7982 Long term (current) use of aspirin: Secondary | ICD-10-CM | POA: Diagnosis not present

## 2019-01-06 DIAGNOSIS — I480 Paroxysmal atrial fibrillation: Secondary | ICD-10-CM | POA: Diagnosis not present

## 2019-01-06 DIAGNOSIS — D631 Anemia in chronic kidney disease: Secondary | ICD-10-CM | POA: Diagnosis not present

## 2019-01-12 DIAGNOSIS — N269 Renal sclerosis, unspecified: Secondary | ICD-10-CM | POA: Diagnosis not present

## 2019-01-12 DIAGNOSIS — N186 End stage renal disease: Secondary | ICD-10-CM | POA: Diagnosis not present

## 2019-01-12 DIAGNOSIS — Z992 Dependence on renal dialysis: Secondary | ICD-10-CM | POA: Diagnosis not present

## 2019-01-14 DIAGNOSIS — N2581 Secondary hyperparathyroidism of renal origin: Secondary | ICD-10-CM | POA: Diagnosis not present

## 2019-01-14 DIAGNOSIS — D689 Coagulation defect, unspecified: Secondary | ICD-10-CM | POA: Diagnosis not present

## 2019-01-14 DIAGNOSIS — N186 End stage renal disease: Secondary | ICD-10-CM | POA: Diagnosis not present

## 2019-01-14 DIAGNOSIS — E876 Hypokalemia: Secondary | ICD-10-CM | POA: Diagnosis not present

## 2019-01-14 DIAGNOSIS — D631 Anemia in chronic kidney disease: Secondary | ICD-10-CM | POA: Diagnosis not present

## 2019-01-17 DIAGNOSIS — I69851 Hemiplegia and hemiparesis following other cerebrovascular disease affecting right dominant side: Secondary | ICD-10-CM | POA: Diagnosis not present

## 2019-01-17 DIAGNOSIS — I6389 Other cerebral infarction: Secondary | ICD-10-CM | POA: Diagnosis not present

## 2019-01-17 DIAGNOSIS — N952 Postmenopausal atrophic vaginitis: Secondary | ICD-10-CM | POA: Diagnosis not present

## 2019-01-17 DIAGNOSIS — Z6822 Body mass index (BMI) 22.0-22.9, adult: Secondary | ICD-10-CM | POA: Diagnosis not present

## 2019-02-12 DIAGNOSIS — N269 Renal sclerosis, unspecified: Secondary | ICD-10-CM | POA: Diagnosis not present

## 2019-02-12 DIAGNOSIS — N186 End stage renal disease: Secondary | ICD-10-CM | POA: Diagnosis not present

## 2019-02-12 DIAGNOSIS — Z992 Dependence on renal dialysis: Secondary | ICD-10-CM | POA: Diagnosis not present

## 2019-02-13 DIAGNOSIS — N2581 Secondary hyperparathyroidism of renal origin: Secondary | ICD-10-CM | POA: Diagnosis not present

## 2019-02-13 DIAGNOSIS — E876 Hypokalemia: Secondary | ICD-10-CM | POA: Diagnosis not present

## 2019-02-13 DIAGNOSIS — N186 End stage renal disease: Secondary | ICD-10-CM | POA: Diagnosis not present

## 2019-02-13 DIAGNOSIS — R52 Pain, unspecified: Secondary | ICD-10-CM | POA: Diagnosis not present

## 2019-02-13 DIAGNOSIS — R197 Diarrhea, unspecified: Secondary | ICD-10-CM | POA: Diagnosis not present

## 2019-02-13 DIAGNOSIS — D631 Anemia in chronic kidney disease: Secondary | ICD-10-CM | POA: Diagnosis not present

## 2019-02-13 DIAGNOSIS — D689 Coagulation defect, unspecified: Secondary | ICD-10-CM | POA: Diagnosis not present

## 2019-02-15 DIAGNOSIS — R2681 Unsteadiness on feet: Secondary | ICD-10-CM | POA: Diagnosis not present

## 2019-02-15 DIAGNOSIS — M6281 Muscle weakness (generalized): Secondary | ICD-10-CM | POA: Diagnosis not present

## 2019-02-15 DIAGNOSIS — R5383 Other fatigue: Secondary | ICD-10-CM | POA: Diagnosis not present

## 2019-02-19 ENCOUNTER — Ambulatory Visit (INDEPENDENT_AMBULATORY_CARE_PROVIDER_SITE_OTHER): Payer: PPO | Admitting: Adult Health

## 2019-02-19 ENCOUNTER — Other Ambulatory Visit: Payer: Self-pay

## 2019-02-19 ENCOUNTER — Encounter: Payer: Self-pay | Admitting: Adult Health

## 2019-02-19 DIAGNOSIS — I480 Paroxysmal atrial fibrillation: Secondary | ICD-10-CM | POA: Diagnosis not present

## 2019-02-19 DIAGNOSIS — I639 Cerebral infarction, unspecified: Secondary | ICD-10-CM

## 2019-02-19 DIAGNOSIS — E785 Hyperlipidemia, unspecified: Secondary | ICD-10-CM

## 2019-02-19 DIAGNOSIS — I10 Essential (primary) hypertension: Secondary | ICD-10-CM | POA: Diagnosis not present

## 2019-02-19 DIAGNOSIS — R29898 Other symptoms and signs involving the musculoskeletal system: Secondary | ICD-10-CM

## 2019-02-19 NOTE — Progress Notes (Signed)
[facility] [address] [place]. [place] [address] [phone]       VIRTUAL VISIT FOLLOW UP NOTE  Alexis Washington Date of Birth:  1932-08-23 Medical Record Number:  [id number]   Reason for Referral:  hospital stroke follow up    Virtual Visit via Video Note  I connected with Alexis Washington on 02/19/19 at  3:15 PM EDT by a video enabled telemedicine application located remotely within my own home and verified that I am speaking with the correct person using two identifiers who was located at their own home and is accompanied by her daughter.   I discussed the limitations of evaluation and management by telemedicine and the availability of in person appointments. The patient expressed understanding and agreed to proceed.   CHIEF COMPLAINT:  Chief Complaint  Patient presents with   Follow-up    stroke follow up - residual RLE weakness    HPI: Alexis Washington has initial in office face to face hospital follow up visit scheduled today in regards to left frontal infarct secondary to small vessel disease on 01/02/19 but due to [place] safety concerns, visit transitioned to telemedicine visit via WebEx. History obtained from patient and chart review. Reviewed all radiology images and labs personally.  Alexis Washington is a 85 y.o. female with history of AF not on AC due to hx GI bleed, ESRD on HD who presented to [facility] ED with R leg weakness and difficulty ambulating. CT head obtained at [facility] showed negative for acute stroke. MRI brain obtained at [facility] showed left frontal white matter infarct with moderate to severe small vessel disease. She was transferred to [facility] for further stroke management. TCD showed diffuse intracranial atherosclerosis. Carotid doppler showed bilateral ICA 1-39% and Vas antegrade. 2D echo showed EF >65% without cardiac source of embolus identified. It was recommended to initiate DAPT for 3 weeks then aspirin 325mg  EC alone. LDL 90 and  initiated atorvastatin 40mg  daily. HTN stable. Discharged home in stable condition with recommendations of home health PT/OT.   She states she has been doing well since she returned home.  She does continue to still have residual right leg weakness but believes this has been improving with continued participation of home physical therapy.  She does continue to use her Rollator walker but has been able to take a couple steps on her own.  She has completed 3 weeks DAPT and continues on aspirin 325 mg alone without side effects of bleeding or bruising.  She has continued on atorvastatin but per daughter, PCP decrease dose to 10 mg daily due to potential side effects of muscle weakness.  She denies any side effects of myalgias on current dose.  She does not routinely monitor BP at home as she does have it monitored 3 days weekly at dialysis and typically ranges 1 50-1 60 prior to treatment and 1 30-1 50 after treatment.  She denies personal history of stroke but does endorse family history (mother).  No further concerns at this time.  Denies new or worsening stroke/TIA symptoms.   ROS:   14 system review of systems performed and negative with exception of weakness and walking difficulty  PMH:  Past Medical History:  Diagnosis Date   Anemia in chronic renal disease    ESRD- Sees Dr Justin Mend   Arthritis    Benign lipomatous neoplasm of kidney    Complication of anesthesia 1998   "blood pressure dropped low during surgery and after surgery  it went up really high."    Diverticulitis    ESRD (end stage renal disease) on dialysis ([place])    "MWF; [facility]" (11/08/2017)   GERD (gastroesophageal reflux disease)    Heart murmur    PCP- said it is nothing to be concerned   HOH (hard of hearing)    Hyperkalemia    Hyperphosphatemia    Hypertension    Hypertensive kidney disease    Hypothyroidism    Nail-patella syndrome    PONV (postoperative nausea and vomiting)     Proteinuria    Shortness of breath dyspnea     PSH:  Past Surgical History:  Procedure Laterality Date   ABDOMINAL HYSTERECTOMY     APPENDECTOMY     AV FISTULA PLACEMENT Right 08/10/2015   Procedure: Right Arm Arteriovenous Brachio-cephalic Fistula ;  Surgeon: Conrad Grace, MD;  Location: [place];  Service: Vascular;  Laterality: Right;   [place] Right 12/15/2015   Procedure: SECOND STAGE Right Arm BRACHIAL VEIN TRANSPOSITION;  Surgeon: Conrad [place], MD;  Location: [place];  Service: Vascular;  Laterality: Right;   CATARACT EXTRACTION W/ INTRAOCULAR LENS  IMPLANT, BILATERAL     CHOLECYSTECTOMY  1998   COLONOSCOPY     ENTEROSCOPY N/A 04/17/2018   Procedure: ENTEROSCOPY;  Surgeon: Wilford Corner, MD;  Location: [place];  Service: Endoscopy;  Laterality: N/A;   ESOPHAGOGASTRODUODENOSCOPY (EGD) WITH PROPOFOL N/A 04/14/2018   Procedure: ESOPHAGOGASTRODUODENOSCOPY (EGD) WITH PROPOFOL;  Surgeon: Ronald Lobo, MD;  Location: [place];  Service: Endoscopy;  Laterality: N/A;   FISTULA SUPERFICIALIZATION Right 08/10/2015   Procedure: Right First Stage Brachial Transposition;  Surgeon: Conrad [place], MD;  Location: [place];  Service: Vascular;  Laterality: Right;   GIVENS CAPSULE STUDY N/A 04/14/2018   Procedure: GIVENS CAPSULE STUDY;  Surgeon: Ronald Lobo, MD;  Location: [place];  Service: Endoscopy;  Laterality: N/A;   HOT HEMOSTASIS N/A 04/17/2018   Procedure: HOT HEMOSTASIS (ARGON PLASMA COAGULATION/BICAP);  Surgeon: Wilford Corner, MD;  Location: [place];  Service: Endoscopy;  Laterality: N/A;   INSERTION OF DIALYSIS CATHETER N/A 08/10/2015   Procedure: INSERTION OF Right Internal Jugular DIALYSIS CATHETER;  Surgeon: Conrad , MD;  Location: [facility] OR;  Service: Vascular;  Laterality: N/A;   IR THORACENTESIS ASP PLEURAL SPACE W/IMG GUIDE  05/11/2017   TONSILLECTOMY      Social History:  Social History   Socioeconomic History   Marital  status: Married    Spouse name: Not on file   Number of children: Not on file   Years of education: Not on file   Highest education level: Not on file  Occupational History   Not on file  Social Needs   Financial resource strain: Not on file   Food insecurity:    Worry: Not on file    Inability: Not on file   Transportation needs:    Medical: Not on file    Non-medical: Not on file  Tobacco Use   Smoking status: Never Smoker   Smokeless tobacco: Never Used  Substance and Sexual Activity   Alcohol use: No    Alcohol/week: 0.0 standard drinks   Drug use: No   Sexual activity: Not on file  Lifestyle   Physical activity:    Days per week: Not on file    Minutes per session: Not on file   Stress: Not on file  Relationships   Social connections:    Talks on phone: Not on file  Gets together: Not on file    Attends religious service: Not on file    Active member of club or organization: Not on file    Attends meetings of clubs or organizations: Not on file    Relationship status: Not on file   Intimate partner violence:    Fear of current or ex partner: Not on file    Emotionally abused: Not on file    Physically abused: Not on file    Forced sexual activity: Not on file  Other Topics Concern   Not on file  Social History Narrative   Not on file    Family History:  Family History  Problem Relation Age of Onset   Hypertension Mother    Heart disease Father        before age 53   Hyperlipidemia Father    Hypertension Father    Heart attack Father     Medications:   Current Outpatient Medications on File Prior to Visit  Medication Sig Dispense Refill   acetaminophen (TYLENOL) 500 MG tablet Take 500 mg by mouth every 8 (eight) hours as needed for mild pain.     aspirin EC 325 MG tablet Take 1 tablet (325 mg total) by mouth daily. Start on 02/05/2019 30 tablet 0   calcium acetate (PHOSLO) 667 MG capsule Take 1,334 mg by mouth 3 (three)  times daily.   1   carvedilol (COREG) 25 MG tablet Take 1 tablet (25 mg total) by mouth 2 (two) times daily. 180 tablet 2   Chlorpheniramine-Acetaminophen (CORICIDIN HBP COLD/FLU PO) Take 1 tablet by mouth as needed (cold symptoms).     docusate sodium (COLACE) 100 MG capsule Take 1 capsule (100 mg total) by mouth 2 (two) times daily as needed for mild constipation. 30 capsule 0   fluticasone (FLONASE) 50 MCG/ACT nasal spray Place 1 spray into both nostrils as needed for allergies or rhinitis.     folic acid-vitamin b complex-vitamin c-selenium-zinc (DIALYVITE) 3 MG TABS tablet Take 1 tablet by mouth daily.     levothyroxine (SYNTHROID, LEVOTHROID) 150 MCG tablet Take 150 mcg by mouth daily.   0   Lidocaine-Prilocaine, Bulk, 2.5-2.5 % CREA Apply 1 application topically as needed (before dialysis).     nitroGLYCERIN (NITROSTAT) 0.4 MG SL tablet Place 1 tablet (0.4 mg total) under the tongue every 5 (five) minutes as needed. 25 tablet 11   omeprazole (PRILOSEC) 40 MG capsule Take 40 mg by mouth daily.     OVER THE COUNTER MEDICATION Place 1 drop into both eyes daily as needed (allergy symptoms). Allergy Relief eye drops     polyethylene glycol (MIRALAX / GLYCOLAX) packet Take 17 g by mouth daily as needed. (Patient taking differently: Take 17 g by mouth daily as needed for mild constipation. ) 14 each 0   Probiotic Product (DIGESTIVE ADVANTAGE) CAPS Take 1 capsule by mouth daily.     raloxifene (EVISTA) 60 MG tablet Take 60 mg by mouth daily.  0   No current facility-administered medications on file prior to visit.     Allergies:   Allergies  Allergen Reactions   Codeine Nausea And Vomiting   Novocain [Procaine] Palpitations     Physical Exam  *Limited exam due to visit type*  Depression screen [facility] 2/9 02/19/2019  Decreased Interest 0  Down, Depressed, Hopeless 0  PHQ - 2 Score 0     General: Very pleasant elderly Caucasian female, seated, in no evident distress Head:  head normocephalic and  atraumatic.    Neurologic Exam Mental Status: Awake and fully alert. Oriented to place and time. Recent and remote memory intact. Attention span, concentration and fund of knowledge appropriate. Mood and affect appropriate.  Cranial Nerves:  Visual fields full to confrontation.  HOH bilaterally. Facial sensation intact. Face, tongue, palate moves normally and symmetrically.  Motor: No upper extremity weakness noted per drift assessment.  Drift present within right lower extremity Sensory.:  Light touch sensation intact with assistance of daughter Coordination: Rapid alternating movements normal in upper extremities bilaterally.  Finger-to-nose performed accurately bilaterally and heel-to-shin performed with mild difficulty of RLE Gait and Station: Arises from chair with mild difficulty and needed to use table for assistance. Stance is slightly hunched.  Gait demonstrates slow cautious steps while holding onto objects around her Reflexes: Unable to assess   NIHSS  1 Modified Rankin  2    Diagnostic Data (Labs, Imaging, Testing)  ECHOCARDIOGRAM 01/03/19 IMPRESSIONS  1. The left ventricle has hyperdynamic systolic function, with an ejection fraction of >65%. The cavity size was normal. Left ventricular diastolic Doppler parameters are consistent with pseudonormalization.  2. The right ventricle has normal systolic function. The cavity was normal. There is no increase in right ventricular wall thickness.  3. Right atrial size was mildly dilated.  4. The mitral valve is normal in structure. There is moderate mitral annular calcification present.  5. The tricuspid valve is normal in structure.  6. The aortic valve is tricuspid Moderate thickening of the aortic valve Severe calcifcation of the aortic valve.  7. Non coronary cusp with severe thickness/ calcification.  8. The pulmonic valve was normal in structure.  9. Pulmonary hypertension is mild.  VAS [place] TRANSCRANIAL  DOPPLER 01/03/19 Summary: Absent left temporal and poor occipital window limits evaluation. Low normal mean flow velocities in identified vessels of anterior and posterior circulation. Globally elevated pulsatility indexes suggestive of diffuse intracranial atherosclerosis likely     ASSESSMENT: Ayde Record is a 85 y.o. year old female here with left frontal infarct on 01/03/19 secondary to small vessel disease. Vascular risk factors include atrial fibrillation not on AC due to hx GI bleed, HTN, HLD and ESRD on HD.  She has been stable from a stroke standpoint with residual deficit of RLE weakness but denies new or worsening stroke/TIA symptoms    PLAN:  1. Left frontal infarct : Continue aspirin 325 mg daily  and lipitor  for secondary stroke prevention. Maintain strict control of hypertension with blood pressure goal below 130/90, diabetes with hemoglobin A1c goal below 6.5% and cholesterol with LDL cholesterol (bad cholesterol) goal below 70 mg/dL.  I also advised the patient to eat a healthy diet with plenty of whole grains, cereals, fruits and vegetables, exercise regularly with at least 30 minutes of continuous activity daily and maintain ideal body weight. 2. RLE weakness: Recommend continuation of home therapy along with practicing exercises on her own.  If she is interested in participating in outpatient therapies in the future, she was advised to call office orders can be placed 3. HTN: Advised to continue current treatment regimen.  Advised to continue to monitor at home along with continued follow-up with PCP for management 4. HLD: Advised to continue current treatment regimen.  Due to decrease in dose, it is recommended to obtain lipid panel at follow-up visit with PCP to ensure adequate management on low-dose.  Discussion with patient and daughter regarding that myalgias could be a potential side effect but statins do not specifically  cause muscle weakness.  If LDL greater than 70 on  repeat lab work, it would be recommended to decrease dose at that time 5. Atrial fibrillation: continue to follow with cardiology for ongoing monitoring and management     Follow up in 6 months or call earlier if needed   Greater than 50% of time during this 25 minute visit was spent on counseling, explanation of diagnosis of left frontal infarct, reviewing risk factor management of HLD, HTN and PAF, planning of further management along with potential future management, and discussion with patient and family answering all questions.    Venancio Poisson, AGNP-BC  [facility] [address] [place], [place] [address]  Phone [phone] Fax [phone] Note: This document was prepared with digital dictation and possible smart phrase technology. Any transcriptional errors that result from this process are unintentional.

## 2019-02-24 NOTE — Progress Notes (Signed)
I agree with the above plan 

## 2019-02-28 DIAGNOSIS — N186 End stage renal disease: Secondary | ICD-10-CM | POA: Diagnosis not present

## 2019-02-28 DIAGNOSIS — Z992 Dependence on renal dialysis: Secondary | ICD-10-CM | POA: Diagnosis not present

## 2019-02-28 DIAGNOSIS — Z95828 Presence of other vascular implants and grafts: Secondary | ICD-10-CM | POA: Diagnosis not present

## 2019-02-28 DIAGNOSIS — I132 Hypertensive heart and chronic kidney disease with heart failure and with stage 5 chronic kidney disease, or end stage renal disease: Secondary | ICD-10-CM | POA: Diagnosis not present

## 2019-02-28 DIAGNOSIS — D508 Other iron deficiency anemias: Secondary | ICD-10-CM | POA: Diagnosis not present

## 2019-02-28 DIAGNOSIS — G4733 Obstructive sleep apnea (adult) (pediatric): Secondary | ICD-10-CM | POA: Diagnosis not present

## 2019-02-28 DIAGNOSIS — K219 Gastro-esophageal reflux disease without esophagitis: Secondary | ICD-10-CM | POA: Diagnosis not present

## 2019-02-28 DIAGNOSIS — R5383 Other fatigue: Secondary | ICD-10-CM | POA: Diagnosis not present

## 2019-02-28 DIAGNOSIS — F5101 Primary insomnia: Secondary | ICD-10-CM | POA: Diagnosis not present

## 2019-02-28 DIAGNOSIS — D631 Anemia in chronic kidney disease: Secondary | ICD-10-CM | POA: Diagnosis not present

## 2019-03-11 ENCOUNTER — Inpatient Hospital Stay (HOSPITAL_COMMUNITY)
Admission: AD | Admit: 2019-03-11 | Discharge: 2019-04-15 | DRG: 193 | Disposition: E | Payer: PPO | Source: Other Acute Inpatient Hospital | Attending: Family Medicine | Admitting: Family Medicine

## 2019-03-11 DIAGNOSIS — R069 Unspecified abnormalities of breathing: Secondary | ICD-10-CM | POA: Diagnosis not present

## 2019-03-11 DIAGNOSIS — J189 Pneumonia, unspecified organism: Secondary | ICD-10-CM | POA: Diagnosis not present

## 2019-03-11 DIAGNOSIS — Y95 Nosocomial condition: Secondary | ICD-10-CM | POA: Diagnosis not present

## 2019-03-11 DIAGNOSIS — R1084 Generalized abdominal pain: Secondary | ICD-10-CM | POA: Diagnosis not present

## 2019-03-11 DIAGNOSIS — Z90710 Acquired absence of both cervix and uterus: Secondary | ICD-10-CM | POA: Diagnosis not present

## 2019-03-11 DIAGNOSIS — Z7951 Long term (current) use of inhaled steroids: Secondary | ICD-10-CM | POA: Diagnosis not present

## 2019-03-11 DIAGNOSIS — Z20828 Contact with and (suspected) exposure to other viral communicable diseases: Secondary | ICD-10-CM | POA: Diagnosis not present

## 2019-03-11 DIAGNOSIS — R401 Stupor: Secondary | ICD-10-CM | POA: Diagnosis not present

## 2019-03-11 DIAGNOSIS — Z66 Do not resuscitate: Secondary | ICD-10-CM | POA: Diagnosis present

## 2019-03-11 DIAGNOSIS — I10 Essential (primary) hypertension: Secondary | ICD-10-CM | POA: Diagnosis present

## 2019-03-11 DIAGNOSIS — R0600 Dyspnea, unspecified: Secondary | ICD-10-CM

## 2019-03-11 DIAGNOSIS — Z992 Dependence on renal dialysis: Secondary | ICD-10-CM | POA: Diagnosis not present

## 2019-03-11 DIAGNOSIS — R531 Weakness: Secondary | ICD-10-CM | POA: Diagnosis not present

## 2019-03-11 DIAGNOSIS — E876 Hypokalemia: Secondary | ICD-10-CM | POA: Diagnosis not present

## 2019-03-11 DIAGNOSIS — H9190 Unspecified hearing loss, unspecified ear: Secondary | ICD-10-CM | POA: Diagnosis present

## 2019-03-11 DIAGNOSIS — G4733 Obstructive sleep apnea (adult) (pediatric): Secondary | ICD-10-CM | POA: Diagnosis not present

## 2019-03-11 DIAGNOSIS — R1111 Vomiting without nausea: Secondary | ICD-10-CM | POA: Diagnosis not present

## 2019-03-11 DIAGNOSIS — D631 Anemia in chronic kidney disease: Secondary | ICD-10-CM | POA: Diagnosis not present

## 2019-03-11 DIAGNOSIS — I480 Paroxysmal atrial fibrillation: Secondary | ICD-10-CM | POA: Diagnosis present

## 2019-03-11 DIAGNOSIS — G92 Toxic encephalopathy: Secondary | ICD-10-CM | POA: Diagnosis present

## 2019-03-11 DIAGNOSIS — E039 Hypothyroidism, unspecified: Secondary | ICD-10-CM | POA: Diagnosis present

## 2019-03-11 DIAGNOSIS — E871 Hypo-osmolality and hyponatremia: Secondary | ICD-10-CM | POA: Diagnosis present

## 2019-03-11 DIAGNOSIS — R569 Unspecified convulsions: Secondary | ICD-10-CM | POA: Diagnosis not present

## 2019-03-11 DIAGNOSIS — Z515 Encounter for palliative care: Secondary | ICD-10-CM

## 2019-03-11 DIAGNOSIS — N186 End stage renal disease: Secondary | ICD-10-CM

## 2019-03-11 DIAGNOSIS — R0902 Hypoxemia: Secondary | ICD-10-CM

## 2019-03-11 DIAGNOSIS — I120 Hypertensive chronic kidney disease with stage 5 chronic kidney disease or end stage renal disease: Secondary | ICD-10-CM | POA: Diagnosis present

## 2019-03-11 DIAGNOSIS — M810 Age-related osteoporosis without current pathological fracture: Secondary | ICD-10-CM | POA: Diagnosis not present

## 2019-03-11 DIAGNOSIS — Z9049 Acquired absence of other specified parts of digestive tract: Secondary | ICD-10-CM | POA: Diagnosis not present

## 2019-03-11 DIAGNOSIS — Z79890 Hormone replacement therapy (postmenopausal): Secondary | ICD-10-CM | POA: Diagnosis not present

## 2019-03-11 DIAGNOSIS — J90 Pleural effusion, not elsewhere classified: Secondary | ICD-10-CM | POA: Diagnosis not present

## 2019-03-11 DIAGNOSIS — J9601 Acute respiratory failure with hypoxia: Secondary | ICD-10-CM | POA: Diagnosis not present

## 2019-03-11 DIAGNOSIS — J181 Lobar pneumonia, unspecified organism: Secondary | ICD-10-CM | POA: Diagnosis not present

## 2019-03-11 DIAGNOSIS — R0603 Acute respiratory distress: Secondary | ICD-10-CM | POA: Diagnosis not present

## 2019-03-11 DIAGNOSIS — N269 Renal sclerosis, unspecified: Secondary | ICD-10-CM | POA: Diagnosis not present

## 2019-03-11 DIAGNOSIS — Z8673 Personal history of transient ischemic attack (TIA), and cerebral infarction without residual deficits: Secondary | ICD-10-CM | POA: Diagnosis not present

## 2019-03-11 DIAGNOSIS — K219 Gastro-esophageal reflux disease without esophagitis: Secondary | ICD-10-CM | POA: Diagnosis present

## 2019-03-11 DIAGNOSIS — R0602 Shortness of breath: Secondary | ICD-10-CM | POA: Diagnosis not present

## 2019-03-11 DIAGNOSIS — N2581 Secondary hyperparathyroidism of renal origin: Secondary | ICD-10-CM | POA: Diagnosis not present

## 2019-03-11 DIAGNOSIS — G4089 Other seizures: Secondary | ICD-10-CM | POA: Diagnosis present

## 2019-03-11 DIAGNOSIS — I160 Hypertensive urgency: Secondary | ICD-10-CM | POA: Diagnosis not present

## 2019-03-11 DIAGNOSIS — R05 Cough: Secondary | ICD-10-CM | POA: Diagnosis not present

## 2019-03-11 DIAGNOSIS — Z7189 Other specified counseling: Secondary | ICD-10-CM

## 2019-03-11 DIAGNOSIS — Z7982 Long term (current) use of aspirin: Secondary | ICD-10-CM | POA: Diagnosis not present

## 2019-03-11 DIAGNOSIS — R109 Unspecified abdominal pain: Secondary | ICD-10-CM

## 2019-03-11 DIAGNOSIS — Z79899 Other long term (current) drug therapy: Secondary | ICD-10-CM | POA: Diagnosis not present

## 2019-03-11 DIAGNOSIS — J9690 Respiratory failure, unspecified, unspecified whether with hypoxia or hypercapnia: Secondary | ICD-10-CM | POA: Diagnosis not present

## 2019-03-11 MED ORDER — CALCIUM ACETATE (PHOS BINDER) 667 MG PO CAPS
1334.0000 mg | ORAL_CAPSULE | Freq: Three times a day (TID) | ORAL | Status: DC
  Administered 2019-03-12 – 2019-03-13 (×4): 1334 mg via ORAL
  Filled 2019-03-11 (×5): qty 2

## 2019-03-11 MED ORDER — SODIUM CHLORIDE 0.9 % IV SOLN: 250.0000 mL | INTRAVENOUS | Status: DC | PRN

## 2019-03-11 MED ORDER — HEPARIN SODIUM (PORCINE) 5000 UNIT/ML IJ SOLN
5000.0000 [IU] | Freq: Three times a day (TID) | INTRAMUSCULAR | Status: DC
  Administered 2019-03-12 – 2019-03-15 (×9): 5000 [IU] via SUBCUTANEOUS
  Filled 2019-03-11 (×9): qty 1

## 2019-03-11 MED ORDER — SODIUM CHLORIDE 0.9% FLUSH
3.0000 mL | Freq: Two times a day (BID) | INTRAVENOUS | Status: DC
  Administered 2019-03-12 (×2): 3 mL via INTRAVENOUS
  Administered 2019-03-13: 14:00:00 via INTRAVENOUS
  Administered 2019-03-13 – 2019-03-15 (×4): 3 mL via INTRAVENOUS

## 2019-03-11 MED ORDER — CARVEDILOL 25 MG PO TABS
25.0000 mg | ORAL_TABLET | Freq: Two times a day (BID) | ORAL | Status: DC
  Administered 2019-03-12 – 2019-03-13 (×3): 25 mg via ORAL
  Filled 2019-03-11 (×5): qty 1

## 2019-03-11 MED ORDER — DOCUSATE SODIUM 100 MG PO CAPS
100.0000 mg | ORAL_CAPSULE | Freq: Two times a day (BID) | ORAL | Status: DC | PRN
  Filled 2019-03-11 (×2): qty 1

## 2019-03-11 MED ORDER — PANTOPRAZOLE SODIUM 40 MG PO TBEC
40.0000 mg | DELAYED_RELEASE_TABLET | Freq: Every day | ORAL | Status: DC
  Administered 2019-03-12 – 2019-03-13 (×2): 40 mg via ORAL
  Filled 2019-03-11 (×3): qty 1

## 2019-03-11 MED ORDER — RISAQUAD PO CAPS
1.0000 | ORAL_CAPSULE | Freq: Every day | ORAL | Status: DC
  Administered 2019-03-12 – 2019-03-13 (×2): 1 via ORAL
  Filled 2019-03-11 (×3): qty 1

## 2019-03-11 MED ORDER — ASPIRIN EC 325 MG PO TBEC
325.0000 mg | DELAYED_RELEASE_TABLET | Freq: Every day | ORAL | Status: DC
  Administered 2019-03-12 – 2019-03-13 (×2): 325 mg via ORAL
  Filled 2019-03-11 (×3): qty 1

## 2019-03-11 MED ORDER — ALBUTEROL SULFATE HFA 108 (90 BASE) MCG/ACT IN AERS: 2.0000 | INHALATION_SPRAY | Freq: Four times a day (QID) | RESPIRATORY_TRACT | Status: DC | PRN

## 2019-03-11 MED ORDER — HYDRALAZINE HCL 20 MG/ML IJ SOLN
10.0000 mg | Freq: Four times a day (QID) | INTRAMUSCULAR | Status: DC | PRN
  Administered 2019-03-14: 10 mg via INTRAVENOUS
  Filled 2019-03-11 (×2): qty 1

## 2019-03-11 MED ORDER — RALOXIFENE HCL 60 MG PO TABS
60.0000 mg | ORAL_TABLET | Freq: Every day | ORAL | Status: DC
  Administered 2019-03-12 – 2019-03-13 (×2): 60 mg via ORAL
  Filled 2019-03-11 (×4): qty 1

## 2019-03-11 MED ORDER — RENA-VITE PO TABS
1.0000 | ORAL_TABLET | Freq: Every day | ORAL | Status: DC
  Administered 2019-03-12 – 2019-03-13 (×4): 1 via ORAL
  Filled 2019-03-11 (×5): qty 1

## 2019-03-11 MED ORDER — SODIUM CHLORIDE 0.9% FLUSH
3.0000 mL | INTRAVENOUS | Status: DC | PRN
  Filled 2019-03-11: qty 3

## 2019-03-11 MED ORDER — LIDOCAINE-PRILOCAINE 2.5-2.5 % EX CREA: 1.0000 "application " | TOPICAL_CREAM | CUTANEOUS | Status: DC | PRN

## 2019-03-11 MED ORDER — POLYETHYLENE GLYCOL 3350 17 G PO PACK
17.0000 g | PACK | Freq: Every day | ORAL | Status: DC | PRN
  Filled 2019-03-11: qty 1

## 2019-03-11 MED ORDER — LEVOTHYROXINE SODIUM 75 MCG PO TABS
150.0000 ug | ORAL_TABLET | Freq: Every day | ORAL | Status: DC
  Administered 2019-03-12 – 2019-03-13 (×2): 150 ug via ORAL
  Filled 2019-03-11: qty 6
  Filled 2019-03-11 (×2): qty 2

## 2019-03-11 NOTE — H&P (Signed)
TRH H&P    Patient Demographics:    Alexis Washington, is a 85 y.o. female  MRN: [id number]  DOB - May 28, 1932  Admit Date - 03/08/2019  Referring MD/NP/PA:   [facility] ER,   Outpatient Primary MD for the patient is [name], [name], MD  Patient coming from: ER at [facility] after being sent from dialysis  Chief complaint- dyspnea   HPI:    Alexis Washington  is a 85 y.o. female, w hypertension, hypothyroidism, ESRD on HD (M,W,F), c/o dyspnea while at dialysis today. Pt states she had her regular amount of fluid taken off.  Pt notes dry cough. N/v x1.   Pt denies fever, chills, cp, palp, orthopnea, pnd, abd pain, diarrhea, brbpr, alteration in sense of smell or taste, dysuria.  In ED T 101.0 P 98  R 22 Bp 221/ 91  Pox 100% on 2L [place]  Abg PH 7.550  pCo2 37, po2 64  Na 135, K 3.4,  Bun 23, Creatinne 4.10   Glucose 112 Calcium 8.1,   Ast 48 (high) Alt 34 Alk phos 85, T. Bili 0.5 LDH 548, CPK 70  Trop  <0.01 BNP 23,500 (0-1800)  Wbc  7.7, Hgb 11.1, Plt 180 LYmph 3.7 % low  Ferritin 1060   Covid negative  CXR  Impression: Mild hazy opacification over the left base slightly worse likely small effusion with atelectasis infection in the left base is possible Mild stable cardiomegaly  Ekg nsr at 94, nl axis, nl int, no st-t changes c/w ischemia  Pt received zithroma 513m po x1, Rocephin 1gm iv x1 Labetalol 267miv x1, enalpril 1.2532mv x1  Pt will be admitted for dyspnea secondary to hypertensive urgency and Hcap    Review of systems:    In addition to the HPI above,  No Fever-chills, No Headache, No changes with Vision or hearing, No problems swallowing food or Liquids, No Chest pain,  No Abdominal pain, bowel movements are regular, No Blood in stool or Urine, No dysuria, No new skin rashes or bruises, No new joints pains-aches,  No new weakness, tingling, numbness in any extremity,  No recent weight gain or loss, No polyuria, polydypsia or polyphagia, No significant Mental Stressors.  All other systems reviewed and are negative.    Past History of the following :    Past Medical History:  Diagnosis Date  . Anemia in chronic renal disease    ESRD- Sees Dr WebJustin Mend Arthritis   . Benign lipomatous neoplasm of kidney   . Complication of anesthesia 1998   "blood pressure dropped low during surgery and after surgery it went up really high."   . Diverticulitis   . [facility] stage renal [facility] (11/08/2017)  . GERD (gastroesophageal reflux disease)   . Heart murmur    PCP- said it is nothing to be concerned  . HOH (hard of hearing)   . Hyperkalemia   . Hyperphosphatemia   . Hypertension   . Hypertensive kidney  disease   . Hypothyroidism   . Nail-patella syndrome   . PONV (postoperative nausea and vomiting)   . Proteinuria   . Shortness of breath dyspnea       Past Surgical History:  Procedure Laterality Date  . ABDOMINAL HYSTERECTOMY    . APPENDECTOMY    . AV FISTULA PLACEMENT Right 08/10/2015   Procedure: Right Arm Arteriovenous Brachio-cephalic Fistula ;  Surgeon: Conrad Saraland, MD;  Location: [place];  Service: Vascular;  Laterality: Right;  . BASCILIC VEIN TRANSPOSITION Right 12/15/2015   Procedure: SECOND STAGE Right Arm BRACHIAL VEIN TRANSPOSITION;  Surgeon: Conrad Pageton, MD;  Location: [place];  Service: Vascular;  Laterality: Right;  . CATARACT EXTRACTION W/ INTRAOCULAR LENS  IMPLANT, BILATERAL    . CHOLECYSTECTOMY  1998  . COLONOSCOPY    . ENTEROSCOPY N/A 04/17/2018   Procedure: ENTEROSCOPY;  Surgeon: Wilford Corner, MD;  Location[facility] ENDOSCOPY;  Service: Endoscopy;  Laterality: N/A;  . ESOPHAGOGASTRODUODENOSCOPY (EGD) WITH PROPOFOL N/A 04/14/2018   Procedure: ESOPHAGOGASTRODUODENOSCOPY (EGD) WITH PROPOFOL;  Surgeon: Ronald Lobo, MD;  Location: [place];  Service: Endoscopy;  Laterality: N/A;   . FISTULA SUPERFICIALIZATION Right 08/10/2015   Procedure: Right First Stage Brachial Transposition;  Surgeon: Conrad Suring, MD;  Location: [place];  Service: Vascular;  Laterality: Right;  . GIVENS CAPSULE STUDY N/A 04/14/2018   Procedure: GIVENS CAPSULE STUDY;  Surgeon: Ronald Lobo, MD;  Location: [facility] ENDOSCOPY;  Service: Endoscopy;  Laterality: N/A;  . HOT HEMOSTASIS N/A 04/17/2018   Procedure: HOT HEMOSTASIS (ARGON PLASMA COAGULATION/BICAP);  Surgeon: Wilford Corner, MD;  Location: [place];  Service: Endoscopy;  Laterality: N/A;  . INSERTION OF DIALYSIS CATHETER N/A 08/10/2015   Procedure: INSERTION OF Right Internal Jugular DIALYSIS CATHETER;  Surgeon: Conrad Bell Canyon, MD;  Location: [place];  Service: Vascular;  Laterality: N/A;  . IR THORACENTESIS ASP PLEURAL SPACE W/IMG GUIDE  05/11/2017  . TONSILLECTOMY        Social History:      Social History   Tobacco Use  . Smoking status: Never Smoker  . Smokeless tobacco: Never Used  Substance Use Topics  . Alcohol use: No    Alcohol/week: 0.0 standard drinks       Family History :     Family History  Problem Relation Age of Onset  . Hypertension Mother   . Heart disease Father        before age 62  . Hyperlipidemia Father   . Hypertension Father   . Heart attack Father        Home Medications:   Prior to Admission medications   Medication Sig Start Date End Date Taking? Authorizing Provider  acetaminophen (TYLENOL) 500 MG tablet Take 500 mg by mouth every 8 (eight) hours as needed for mild pain.    [provider]  aspirin EC 325 MG tablet Take 1 tablet (325 mg total) by mouth daily. Start on 02/05/2019 02/05/19   Mariel Aloe, MD  calcium acetate (PHOSLO) 667 MG capsule Take 1,334 mg by mouth 3 (three) times daily.  04/21/17   [provider]  carvedilol (COREG) 25 MG tablet Take 1 tablet (25 mg total) by mouth 2 (two) times daily. 11/01/18 01/30/19  Revankar, Reita Cliche, MD   Chlorpheniramine-Acetaminophen (CORICIDIN HBP COLD/FLU PO) Take 1 tablet by mouth as needed (cold symptoms).    [provider]  docusate sodium (COLACE) 100 MG capsule Take 1 capsule (100 mg total) by mouth 2 (two) times daily  as needed for mild constipation. 05/12/17   Amin, Jeanella Flattery, MD  fluticasone (FLONASE) 50 MCG/ACT nasal spray Place 1 spray into both nostrils as needed for allergies or rhinitis.    [provider]  folic acid-vitamin b complex-vitamin c-selenium-zinc (DIALYVITE) 3 MG TABS tablet Take 1 tablet by mouth daily.    [provider]  levothyroxine (SYNTHROID, LEVOTHROID) 150 MCG tablet Take 150 mcg by mouth daily.  08/28/17   [provider]  Lidocaine-Prilocaine, Bulk, 2.5-2.5 % CREA Apply 1 application topically as needed (before dialysis).    [provider]  nitroGLYCERIN (NITROSTAT) 0.4 MG SL tablet Place 1 tablet (0.4 mg total) under the tongue every 5 (five) minutes as needed. 11/01/18 01/30/19  Revankar, Reita Cliche, MD  omeprazole (PRILOSEC) 40 MG capsule Take 40 mg by mouth daily.    [provider]  OVER THE COUNTER MEDICATION Place 1 drop into both eyes daily as needed (allergy symptoms). Allergy Relief eye drops    [provider]  polyethylene glycol (MIRALAX / GLYCOLAX) packet Take 17 g by mouth daily as needed. Patient taking differently: Take 17 g by mouth daily as needed for mild constipation.  05/12/17   Damita Lack, MD  Probiotic Product (DIGESTIVE ADVANTAGE) CAPS Take 1 capsule by mouth daily.    [provider]  raloxifene (EVISTA) 60 MG tablet Take 60 mg by mouth daily. 04/24/17   [provider]     Allergies:     Allergies  Allergen Reactions  . Codeine Nausea And Vomiting  . Novocain [Procaine] Palpitations     Physical Exam:   Vitals  Blood pressure (!) 140/54, pulse 73, temperature 98.7 F (37.1 C), temperature source Oral, resp. rate 20, height 5' 2"  (1.575  m), weight 54.2 kg, SpO2 99 %.  1.  General: axox3  2. Psychiatric: euthymic  3. Neurologic: cn2-12 intact, reflexes 2+ symmetric, diffuse, with no clonus, motor 5/5 in all 4 ext  4. HEENMT:  Anicteric, pupils 1.63m symmetric, direct, consensual, near intact  5. Respiratory : Slight crackles left base, no wheezing  6. Cardiovascular : rrr s1, s2, 2/6 sem apex  7. Gastrointestinal:  Abd: soft, nt, nd, +bs  8. Skin:  Ext:: no c/c/e, no rash  9.Musculoskeletal:  Good ROM  No adenopathy    Data Review:    CBC No results for input(s): WBC, HGB, HCT, PLT, MCV, MCH, MCHC, RDW, LYMPHSABS, MONOABS, EOSABS, BASOSABS, BANDABS in the last 168 hours.  Invalid input(s): NEUTRABS, BANDSABD ------------------------------------------------------------------------------------------------------------------  No results found for this or any previous visit (from the past 447hour(s)).  Chemistries  No results for input(s): NA, K, CL, CO2, GLUCOSE, BUN, CREATININE, CALCIUM, MG, AST, ALT, ALKPHOS, BILITOT in the last 168 hours.  Invalid input(s): GFRCGP ------------------------------------------------------------------------------------------------------------------  ------------------------------------------------------------------------------------------------------------------ GFR: CrCl cannot be calculated (Patient's most recent lab result is older than the maximum 21 days allowed.). Liver Function Tests: No results for input(s): AST, ALT, ALKPHOS, BILITOT, PROT, ALBUMIN in the last 168 hours. No results for input(s): LIPASE, AMYLASE in the last 168 hours. No results for input(s): AMMONIA in the last 168 hours. Coagulation Profile: No results for input(s): INR, PROTIME in the last 168 hours. Cardiac Enzymes: No results for input(s): CKTOTAL, CKMB, CKMBINDEX, TROPONINI in the last 168 hours. BNP (last 3 results) No results for input(s): PROBNP in the last 8760 hours. HbA1C:  No results for input(s): HGBA1C in the last 72 hours. CBG: No results for input(s): GLUCAP in the last 168 hours.  Lipid Profile: No results for input(s): CHOL, HDL, LDLCALC, TRIG, CHOLHDL, LDLDIRECT in the last 72 hours. Thyroid Function Tests: No results for input(s): TSH, T4TOTAL, FREET4, T3FREE, THYROIDAB in the last 72 hours. Anemia Panel: No results for input(s): VITAMINB12, FOLATE, FERRITIN, TIBC, IRON, RETICCTPCT in the last 72 hours.  --------------------------------------------------------------------------------------------------------------- Urine analysis: No results found for: COLORURINE, APPEARANCEUR, LABSPEC, PHURINE, GLUCOSEU, HGBUR, BILIRUBINUR, KETONESUR, PROTEINUR, UROBILINOGEN, NITRITE, LEUKOCYTESUR    Imaging Results:    No results found.     Assessment & Plan:    Active Problems:   ESRD on dialysis ([place])   Hypertension   Hypothyroidism   Dyspnea  Dyspnea secondary to Hcap, hypertensive urgency Blood culture x2 Check covid  Check urine strep, urine legionella antigen Start vanco iv, cefepime iv pharmacy to dose, appreciate input  Hypertension Hydralazine 27m iv q6h prn sbp >160 Cont carvedilol 256mpo bid  Hypothyroidism Cont Levothyroxine  Gerd Cont PPI  Osteoporosis Cont Evista  Hypokalemia/ Hyponatremia Check cmp in am  ESRD on HD (M, W, F), last dialysis 4/26 Cont Phoslo Please consult nephrology for arrangements in dialysis in AM   DVT Prophylaxis-   heparin - SCDs   AM Labs Ordered, also please review Full Orders  Family Communication: Admission, patients condition and plan of care including tests being ordered have been discussed with the patient  who indicate understanding and agree with the plan and Code Status.  Code Status:  FULL CODE  Admission status: Observation/Inpatient: Based on patients clinical presentation and evaluation of above clinical data, I have made determination that patient meets Inpatient criteria at  this time,  Pt will require iv abx for Hcap, and close monitoring of electrolytes/ BUn/Creatinine due to being on dialysis   Time spent in minutes : 70   JaJani Gravel.D on 03/02/2019 at 10:51 PM

## 2019-03-11 NOTE — Progress Notes (Signed)
Pt came to unit from [facility] ED with AV fistula accessed. Site is clean with no drainage and tubing is taped down. Charge nurse called nurse on call for dialysis.

## 2019-03-12 ENCOUNTER — Other Ambulatory Visit: Payer: Self-pay

## 2019-03-12 ENCOUNTER — Encounter (HOSPITAL_COMMUNITY): Payer: Self-pay

## 2019-03-12 ENCOUNTER — Inpatient Hospital Stay (HOSPITAL_COMMUNITY): Payer: PPO

## 2019-03-12 DIAGNOSIS — R0902 Hypoxemia: Secondary | ICD-10-CM

## 2019-03-12 LAB — CBC WITH DIFFERENTIAL/PLATELET
Abs Immature Granulocytes: 0.02 10*3/uL (ref 0.00–0.07)
Basophils Absolute: 0 10*3/uL (ref 0.0–0.1)
Basophils Relative: 0 %
Eosinophils Absolute: 0.1 10*3/uL (ref 0.0–0.5)
Eosinophils Relative: 1 %
HCT: 32.5 % — ABNORMAL LOW (ref 36.0–46.0)
Hemoglobin: 10.3 g/dL — ABNORMAL LOW (ref 12.0–15.0)
Immature Granulocytes: 0 %
Lymphocytes Relative: 9 %
Lymphs Abs: 0.8 10*3/uL (ref 0.7–4.0)
MCH: 28.4 pg (ref 26.0–34.0)
MCHC: 31.7 g/dL (ref 30.0–36.0)
MCV: 89.5 fL (ref 80.0–100.0)
Monocytes Absolute: 0.5 10*3/uL (ref 0.1–1.0)
Monocytes Relative: 6 %
Neutro Abs: 7.3 10*3/uL (ref 1.7–7.7)
Neutrophils Relative %: 84 %
Platelets: 168 10*3/uL (ref 150–400)
RBC: 3.63 MIL/uL — ABNORMAL LOW (ref 3.87–5.11)
RDW: 16.1 % — ABNORMAL HIGH (ref 11.5–15.5)
WBC: 8.7 10*3/uL (ref 4.0–10.5)
nRBC: 0 % (ref 0.0–0.2)

## 2019-03-12 LAB — COMPREHENSIVE METABOLIC PANEL
ALT: 48 U/L — ABNORMAL HIGH (ref 0–44)
AST: 60 U/L — ABNORMAL HIGH (ref 15–41)
Albumin: 3.1 g/dL — ABNORMAL LOW (ref 3.5–5.0)
Alkaline Phosphatase: 83 U/L (ref 38–126)
Anion gap: 12 (ref 5–15)
BUN: 29 mg/dL — ABNORMAL HIGH (ref 8–23)
CO2: 30 mmol/L (ref 22–32)
Calcium: 8.5 mg/dL — ABNORMAL LOW (ref 8.9–10.3)
Chloride: 96 mmol/L — ABNORMAL LOW (ref 98–111)
Creatinine, Ser: 5.47 mg/dL — ABNORMAL HIGH (ref 0.44–1.00)
GFR calc Af Amer: 8 mL/min — ABNORMAL LOW (ref >60)
GFR calc non Af Amer: 7 mL/min — ABNORMAL LOW (ref >60)
Glucose, Bld: 109 mg/dL — ABNORMAL HIGH (ref 70–99)
Potassium: 3.8 mmol/L (ref 3.5–5.1)
Sodium: 138 mmol/L (ref 135–145)
Total Bilirubin: 0.5 mg/dL (ref 0.3–1.2)
Total Protein: 6 g/dL — ABNORMAL LOW (ref 6.5–8.1)

## 2019-03-12 LAB — CREATININE, SERUM
Creatinine, Ser: 5.62 mg/dL — ABNORMAL HIGH (ref 0.44–1.00)
GFR calc Af Amer: 7 mL/min — ABNORMAL LOW (ref >60)
GFR calc non Af Amer: 6 mL/min — ABNORMAL LOW (ref >60)

## 2019-03-12 LAB — ABO/RH: ABO/RH(D): A POS

## 2019-03-12 LAB — SARS CORONAVIRUS 2 BY RT PCR (HOSPITAL ORDER, PERFORMED IN ~~LOC~~ HOSPITAL LAB): SARS Coronavirus 2: NEGATIVE

## 2019-03-12 LAB — C-REACTIVE PROTEIN: CRP: 7.5 mg/dL — ABNORMAL HIGH (ref <1.0)

## 2019-03-12 MED ORDER — DICLOFENAC SODIUM 1 % TD GEL
2.0000 g | Freq: Two times a day (BID) | TRANSDERMAL | Status: DC
  Administered 2019-03-12 – 2019-03-14 (×5): 2 g via TOPICAL
  Filled 2019-03-12: qty 100

## 2019-03-12 MED ORDER — SODIUM CHLORIDE 0.9 % IV SOLN
2.0000 g | INTRAVENOUS | Status: DC
  Administered 2019-03-13 (×2): 2 g via INTRAVENOUS
  Filled 2019-03-12 (×2): qty 2

## 2019-03-12 MED ORDER — VANCOMYCIN HCL IN DEXTROSE 1-5 GM/200ML-% IV SOLN
1000.0000 mg | Freq: Once | INTRAVENOUS | Status: AC
  Administered 2019-03-12: 1000 mg via INTRAVENOUS
  Filled 2019-03-12: qty 200

## 2019-03-12 MED ORDER — AMLODIPINE BESYLATE 5 MG PO TABS
5.0000 mg | ORAL_TABLET | Freq: Every day | ORAL | Status: DC
  Administered 2019-03-12 – 2019-03-13 (×2): 5 mg via ORAL
  Filled 2019-03-12 (×3): qty 1

## 2019-03-12 MED ORDER — VANCOMYCIN HCL IN DEXTROSE 500-5 MG/100ML-% IV SOLN
500.0000 mg | INTRAVENOUS | Status: DC
  Administered 2019-03-13: 11:00:00 500 mg via INTRAVENOUS
  Filled 2019-03-12 (×2): qty 100

## 2019-03-12 MED ORDER — CHLORHEXIDINE GLUCONATE CLOTH 2 % EX PADS
6.0000 | MEDICATED_PAD | Freq: Every day | CUTANEOUS | Status: DC
  Administered 2019-03-12 – 2019-03-15 (×4): 6 via TOPICAL

## 2019-03-12 MED ORDER — ONDANSETRON HCL 4 MG/2ML IJ SOLN
4.0000 mg | Freq: Four times a day (QID) | INTRAMUSCULAR | Status: DC | PRN
  Administered 2019-03-13 (×2): 4 mg via INTRAVENOUS
  Filled 2019-03-12 (×2): qty 2

## 2019-03-12 MED ORDER — SODIUM CHLORIDE 0.9 % IV SOLN
2.0000 g | Freq: Once | INTRAVENOUS | Status: AC
  Administered 2019-03-12: 2 g via INTRAVENOUS
  Filled 2019-03-12: qty 2

## 2019-03-12 NOTE — Progress Notes (Signed)
PROGRESS NOTE    Alexis Washington  ZSW:[id number] DOB: 04-Mar-1932 DOA: 02/13/2019 PCP: Rochel Brome, MD    Brief Narrative: 85 year old with past medical history significant for hypertension, hypothyroidism, end-stage renal disease on hemodialysis (Monday Wednesday and Friday) who presents complaining of shortness of breath while at dialysis day of admission.  Patient report that she had the usual amount of fluid taken off of during dialysis.  She report cough.  She also report nausea and vomiting x1.  No fever no chest pain. Evaluation in the ED patient was found to be febrile with temperature 101 blood pressure 221/91 oxygen satting 100% on 2 L.  ABG PO2 64. Patient was admitted for hypertensive urgency and healthcare associated pneumonia.  Assessment & Plan:   Active Problems:   ESRD on dialysis [facility])   Hypertension   Hypothyroidism   Dyspnea   Hypokalemia   HCAP (healthcare-associated pneumonia)  1-Dyspnea secondary to healthcare associated pneumonia and hypertensive urgency: Dyspnea has improved today.  Oxygen saturation 94% on room air. Treating for pneumonia and Hypertension.  2-pneumonia: Chest x-ray with left lower opacity. Continue with vancomycin and cefepime. COVID 19- negative.   3-Hypertensive urgency Continue with carvedilol.  PRN hydralazine. Start Norvasc.  4-hypothyroidism: Continue with Synthroid  5-ESRD; on hemodialysis Monday Wednesday and Friday.  Last hemodialysis on 4/26: Nephrology consulted   6-GERD; continue with PPI.   Transaminases; follow trend    Estimated body mass index is 21.86 kg/m as calculated from the following:   Height as of this encounter: $RemoveBeforeD'5\' 2"'[id number]$  (1.575 m).   Weight as of this encounter: 54.2 kg.   DVT prophylaxis: Heparin Code Status: Full code Family Communication: Care discussed with patient Disposition Plan: In the hospital for IV antibiotics and treatment of pneumonia.  Consultants:   Nephrology   Procedures:    Hemodialysis   Antimicrobials:  Vancomycin cefepime   Subjective: Patient seen and examined at.  She is still complaining of shortness of breath, cough.  Objective: Vitals:   03/06/2019 2216  BP: (!) 140/54  Pulse: 73  Resp: 20  Temp: 98.7 F (37.1 C)  TempSrc: Oral  SpO2: 99%  Weight: 54.2 kg  Height: $Remove'5\' 2"'MxGHBmV$  (1.575 m)   No intake or output data in the 24 hours ending 03/12/19 0733 Filed Weights   02/22/2019 2216  Weight: 54.2 kg    Examination:  General exam: Appears calm and comfortable  Respiratory system; crackles at the bases Cardiovascular system: S1 & S2 heard, RRR. No JVD, murmurs, rubs, gallops or clicks. No pedal edema. Gastrointestinal system: Abdomen is nondistended, soft and nontender. No organomegaly or masses felt. Normal bowel sounds heard. Central nervous system: Alert and oriented. No focal neurological deficits. Extremities: Symmetric 5 x 5 power. Skin: No rashes, lesions or ulcers Psychiatry: Judgement and insight appear normal. Mood & affect appropriate.     Data Reviewed: I have personally reviewed following labs and imaging studies  CBC: Recent Labs  Lab 03/12/19 0009  WBC 8.7  NEUTROABS 7.3  HGB 10.3*  HCT 32.5*  MCV 89.5  PLT 322   Basic Metabolic Panel: Recent Labs  Lab 03/12/19 0009  NA 138  K 3.8  CL 96*  CO2 30  GLUCOSE 109*  BUN 29*  CREATININE 5.47*  5.62*  CALCIUM 8.5*   GFR: Estimated Creatinine Clearance: 5.8 mL/min (A) (by C-G formula based on SCr of 5.62 mg/dL (H)). Liver Function Tests: Recent Labs  Lab 03/12/19 0009  AST 60*  ALT 48*  ALKPHOS 83  BILITOT 0.5  PROT 6.0*  ALBUMIN 3.1*   No results for input(s): LIPASE, AMYLASE in the last 168 hours. No results for input(s): AMMONIA in the last 168 hours. Coagulation Profile: No results for input(s): INR, PROTIME in the last 168 hours. Cardiac Enzymes: No results for input(s): CKTOTAL, CKMB, CKMBINDEX, TROPONINI in the last 168 hours. BNP (last 3  results) No results for input(s): PROBNP in the last 8760 hours. HbA1C: No results for input(s): HGBA1C in the last 72 hours. CBG: No results for input(s): GLUCAP in the last 168 hours. Lipid Profile: No results for input(s): CHOL, HDL, LDLCALC, TRIG, CHOLHDL, LDLDIRECT in the last 72 hours. Thyroid Function Tests: No results for input(s): TSH, T4TOTAL, FREET4, T3FREE, THYROIDAB in the last 72 hours. Anemia Panel: No results for input(s): VITAMINB12, FOLATE, FERRITIN, TIBC, IRON, RETICCTPCT in the last 72 hours. Sepsis Labs: No results for input(s): PROCALCITON, LATICACIDVEN in the last 168 hours.  Recent Results (from the past 240 hour(s))  SARS Coronavirus 2 [facility] order, Performed in [facility] hospital lab)     Status: None   Collection Time: 02/13/2019 11:56 PM  Result Value Ref Range Status   SARS Coronavirus 2 NEGATIVE NEGATIVE Final    Comment: (NOTE) If result is NEGATIVE SARS-CoV-2 target nucleic acids are NOT DETECTED. The SARS-CoV-2 RNA is generally detectable in upper and lower  respiratory specimens during the acute phase of infection. The lowest  concentration of SARS-CoV-2 viral copies this assay can detect is 250  copies / mL. A negative result does not preclude SARS-CoV-2 infection  and should not be used as the sole basis for treatment or other  patient management decisions.  A negative result may occur with  improper specimen collection / handling, submission of specimen other  than nasopharyngeal swab, presence of viral mutation(s) within the  areas targeted by this assay, and inadequate number of viral copies  (<250 copies / mL). A negative result must be combined with clinical  observations, patient history, and epidemiological information. If result is POSITIVE SARS-CoV-2 target nucleic acids are DETECTED. The SARS-CoV-2 RNA is generally detectable in upper and lower  respiratory specimens dur ing the acute phase of infection.  Positive  results are  indicative of active infection with SARS-CoV-2.  Clinical  correlation with patient history and other diagnostic information is  necessary to determine patient infection status.  Positive results do  not rule out bacterial infection or co-infection with other viruses. If result is PRESUMPTIVE POSTIVE SARS-CoV-2 nucleic acids MAY BE PRESENT.   A presumptive positive result was obtained on the submitted specimen  and confirmed on repeat testing.  While 2019 novel coronavirus  (SARS-CoV-2) nucleic acids may be present in the submitted sample  additional confirmatory testing may be necessary for epidemiological  and / or clinical management purposes  to differentiate between  SARS-CoV-2 and other Sarbecovirus currently known to infect humans.  If clinically indicated additional testing with an alternate test  methodology [phone]) is advised. The SARS-CoV-2 RNA is generally  detectable in upper and lower respiratory sp ecimens during the acute  phase of infection. The expected result is Negative. Fact Sheet for Patients:  StrictlyIdeas.no Fact Sheet for Healthcare Providers: BankingDealers.co.za This test is not yet approved or cleared by the [place] FDA and has been authorized for detection and/or diagnosis of SARS-CoV-2 by FDA under an Emergency Use Authorization (EUA).  This EUA will remain in effect (meaning this test can be used) for the duration of the COVID-19 declaration  under Section 564(b)(1) of the Act, 21 U.S.C. section 360bbb-3(b)(1), unless the authorization is terminated or revoked sooner. Performed at [facility], [place] [address], [place], [place] [address]          Radiology Studies: No results found.      Scheduled Meds: . acidophilus  1 capsule Oral Daily  . aspirin EC  325 mg Oral Daily  . calcium acetate  1,334 mg Oral TID WC  . carvedilol  25 mg Oral BID WC  . heparin  5,000 Units Subcutaneous  Q8H  . levothyroxine  150 mcg Oral Daily  . multivitamin  1 tablet Oral QHS  . pantoprazole  40 mg Oral Daily  . raloxifene  60 mg Oral Daily  . sodium chloride flush  3 mL Intravenous Q12H  . [START ON 03/13/2019] vancomycin  500 mg Intravenous Q M,W,F-HD   Continuous Infusions: . sodium chloride    . [START ON 03/13/2019] ceFEPime (MAXIPIME) IV       LOS: 1 day    Time spent: 35 minutes.     Elmarie Shiley, MD [facility] Pager [phone]  If 7PM-7AM, please contact night-coverage [email or web address] Password [facility] 03/12/2019, 7:33 AM

## 2019-03-12 NOTE — Progress Notes (Signed)
Text msg sent to doctor on call re: nausea without prn order and Telemetry runs out at midnight does he want to renew?

## 2019-03-12 NOTE — Progress Notes (Signed)
Pharmacy Antibiotic Note  Camden Mazzaferro is a 85 y.o. female admitted on 02/13/2019 with pneumonia.  Pharmacy has been consulted for Vancomycin/Cefepime dosing. Pt has ESRD on HD MWF. Transfer from [place]; anti-biotics at [place] included Ceftriaxone 1g IV and Azithromycin 500 mg tablet x 1.    Plan: Vancomycin 1000 mg IV x 1, then give 500 mg IV qHD MWF Cefepime 2g IV every MWF at 2000 Trend WBC, temp, renal function  F/U infectious work-up Drug levels as indicated   Height: $Remove'5\' 2"'CzasWTk$  (157.5 cm) Weight: 119 lb 8 oz (54.2 kg) IBW/kg (Calculated) : 50.1  Temp (24hrs), Avg:98.7 F (37.1 C), Min:98.7 F (37.1 C), Max:98.7 F (37.1 C)   Allergies  Allergen Reactions  . Codeine Nausea And Vomiting  . Novocain [Procaine] Palpitations     Narda Bonds 03/12/2019 12:18 AM

## 2019-03-12 NOTE — Progress Notes (Addendum)
This Probation officer has been informed by charge nurse that pt's HD needles still atttached to the AVF shunt on the right upper arm. Seen pt in room alert/oriented in no apparent distress. Aspirated and flushed needles without issue.Deaccessed gauge 16 needs from AVF shunt without difficulty, pressured applied,dressing in placed and intact with no bleeding noted at this time. Report given to [facility]

## 2019-03-12 NOTE — Consult Note (Signed)
Renal Service Consult Note [place] 03/12/2019 Sol Blazing Requesting Physician:  Dr Tyrell Antonio  Reason for Consult:  ESRD pt w/ PNA HPI: The patient is a 85 y.o. year-old w/ hx of HTN, HOH, and ESRD on HD MWF came to [facility] ED yesterday after dialysis due to cough, N/V and SOB.  Covid was negative and CXR showed L lower lobe infiltrate. Pt admitted and started on IV abx for PNA.  Transferred to [facility] for ability to do HD. Asked to see for ESRD.    Patient denies any leg swelling or missed HD.  States she feels "much better" today. SOB is better.   ROS  denies CP  no joint pain   no HA  no blurry vision  no rash  no diarrhea  no nausea/ vomiting   Past Medical History  Past Medical History:  Diagnosis Date  . Anemia in chronic renal disease    ESRD- Sees Dr Justin Mend  . Arthritis   . Benign lipomatous neoplasm of kidney   . Complication of anesthesia 1998   "blood pressure dropped low during surgery and after surgery it went up really high."   . Diverticulitis   . ESRD (end stage renal disease) on dialysis [facility])    "MWF; [place]" (11/08/2017)  . GERD (gastroesophageal reflux disease)   . Heart murmur    PCP- said it is nothing to be concerned  . HOH (hard of hearing)   . Hyperkalemia   . Hyperphosphatemia   . Hypertension   . Hypertensive kidney disease   . Hypothyroidism   . Nail-patella syndrome   . PONV (postoperative nausea and vomiting)   . Proteinuria   . Shortness of breath dyspnea    Past Surgical History  Past Surgical History:  Procedure Laterality Date  . ABDOMINAL HYSTERECTOMY    . APPENDECTOMY    . AV FISTULA PLACEMENT Right 08/10/2015   Procedure: Right Arm Arteriovenous Brachio-cephalic Fistula ;  Surgeon: Conrad Onton, MD;  Location: [place];  Service: Vascular;  Laterality: Right;  . BASCILIC VEIN TRANSPOSITION Right 12/15/2015   Procedure: SECOND STAGE Right Arm BRACHIAL VEIN TRANSPOSITION;   Surgeon: Conrad [place], MD;  Location: [place];  Service: Vascular;  Laterality: Right;  . CATARACT EXTRACTION W/ INTRAOCULAR LENS  IMPLANT, BILATERAL    . CHOLECYSTECTOMY  1998  . COLONOSCOPY    . ENTEROSCOPY N/A 04/17/2018   Procedure: ENTEROSCOPY;  Surgeon: Wilford Corner, MD;  Location: [facility] ENDOSCOPY;  Service: Endoscopy;  Laterality: N/A;  . ESOPHAGOGASTRODUODENOSCOPY (EGD) WITH PROPOFOL N/A 04/14/2018   Procedure: ESOPHAGOGASTRODUODENOSCOPY (EGD) WITH PROPOFOL;  Surgeon: Ronald Lobo, MD;  Location: [place];  Service: Endoscopy;  Laterality: N/A;  . FISTULA SUPERFICIALIZATION Right 08/10/2015   Procedure: Right First Stage Brachial Transposition;  Surgeon: Conrad Ely, MD;  Location: [place];  Service: Vascular;  Laterality: Right;  . GIVENS CAPSULE STUDY N/A 04/14/2018   Procedure: GIVENS CAPSULE STUDY;  Surgeon: Ronald Lobo, MD;  Location: [facility] ENDOSCOPY;  Service: Endoscopy;  Laterality: N/A;  . HOT HEMOSTASIS N/A 04/17/2018   Procedure: HOT HEMOSTASIS (ARGON PLASMA COAGULATION/BICAP);  Surgeon: Wilford Corner, MD;  Location: [place];  Service: Endoscopy;  Laterality: N/A;  . INSERTION OF DIALYSIS CATHETER N/A 08/10/2015   Procedure: INSERTION OF Right Internal Jugular DIALYSIS CATHETER;  Surgeon: Conrad Willowbrook, MD;  Location: [place];  Service: Vascular;  Laterality: N/A;  . IR THORACENTESIS ASP PLEURAL SPACE W/IMG GUIDE  05/11/2017  .  TONSILLECTOMY     Family History  Family History  Problem Relation Age of Onset  . Hypertension Mother   . Heart disease Father        before age 60  . Hyperlipidemia Father   . Hypertension Father   . Heart attack Father    Social History  reports that she has never smoked. She has never used smokeless tobacco. She reports that she does not drink alcohol or use drugs. Allergies  Allergies  Allergen Reactions  . Codeine Nausea And Vomiting  . Novocain [Procaine] Palpitations   Home medications Prior to Admission medications    Medication Sig Start Date End Date Taking? Authorizing Provider  aspirin EC 325 MG tablet Take 1 tablet (325 mg total) by mouth daily. Start on 02/05/2019 02/05/19  Yes Mariel Aloe, MD  calcium acetate (PHOSLO) 667 MG capsule Take 1,334 mg by mouth 3 (three) times daily.  04/21/17  Yes [provider]  docusate sodium (COLACE) 100 MG capsule Take 1 capsule (100 mg total) by mouth 2 (two) times daily as needed for mild constipation. 05/12/17  Yes Amin, Jeanella Flattery, MD  folic acid-vitamin b complex-vitamin c-selenium-zinc (DIALYVITE) 3 MG TABS tablet Take 1 tablet by mouth daily.   Yes [provider]  levothyroxine (SYNTHROID, LEVOTHROID) 150 MCG tablet Take 150 mcg by mouth daily.  08/28/17  Yes [provider]  Lidocaine-Prilocaine, Bulk, 2.5-2.5 % CREA Apply 1 application topically as needed (before dialysis).   Yes [provider]  omeprazole (PRILOSEC) 40 MG capsule Take 40 mg by mouth daily.   Yes [provider]  Probiotic Product (DIGESTIVE ADVANTAGE) CAPS Take 1 capsule by mouth daily.   Yes [provider]  raloxifene (EVISTA) 60 MG tablet Take 60 mg by mouth daily. 04/24/17  Yes [provider]  acetaminophen (TYLENOL) 500 MG tablet Take 500 mg by mouth every 8 (eight) hours as needed for mild pain.    [provider]  carvedilol (COREG) 25 MG tablet Take 1 tablet (25 mg total) by mouth 2 (two) times daily. 11/01/18 01/30/19  Revankar, Reita Cliche, MD  Chlorpheniramine-Acetaminophen (CORICIDIN HBP COLD/FLU PO) Take 1 tablet by mouth as needed (cold symptoms).    [provider]  fluticasone (FLONASE) 50 MCG/ACT nasal spray Place 1 spray into both nostrils as needed for allergies or rhinitis.    [provider]  nitroGLYCERIN (NITROSTAT) 0.4 MG SL tablet Place 1 tablet (0.4 mg total) under the tongue every 5 (five) minutes as needed. 11/01/18 01/30/19  Revankar, Reita Cliche, MD  OVER THE COUNTER MEDICATION Place  1 drop into both eyes daily as needed (allergy symptoms). Allergy Relief eye drops    [provider]  polyethylene glycol (MIRALAX / GLYCOLAX) packet Take 17 g by mouth daily as needed. Patient taking differently: Take 17 g by mouth daily as needed for mild constipation.  05/12/17   Damita Lack, MD   Liver Function Tests Recent Labs  Lab 03/12/19 0009  AST 60*  ALT 48*  ALKPHOS 83  BILITOT 0.5  PROT 6.0*  ALBUMIN 3.1*   No results for input(s): LIPASE, AMYLASE in the last 168 hours. CBC Recent Labs  Lab 03/12/19 0009  WBC 8.7  NEUTROABS 7.3  HGB 10.3*  HCT 32.5*  MCV 89.5  PLT 161   Basic Metabolic Panel Recent Labs  Lab 03/12/19 0009  NA 138  K 3.8  CL 96*  CO2 30  GLUCOSE 109*  BUN 29*  CREATININE 5.47*  5.62*  CALCIUM 8.5*   Iron/TIBC/Ferritin/ %Sat    Component Value Date/Time   IRON 29 04/16/2018 0816   TIBC 164 (L) 04/16/2018 0816   FERRITIN 780 (H) 04/16/2018 0816   IRONPCTSAT 18 04/16/2018 0816    Vitals:   02/13/2019 2216 03/12/19 0940  BP: (!) 140/54 (!) 150/62  Pulse: 73 82  Resp: 20 18  Temp: 98.7 F (37.1 C) 98 F (36.7 C)  TempSrc: Oral Oral  SpO2: 99% 94%  Weight: 54.2 kg   Height: $Remove'5\' 2"'IbDEAGI$  (1.575 m)    Exam Gen alert, no distress No rash, cyanosis or gangrene Sclera anicteric, throat clear  No jvd or bruits Chest clear bilat not rales or wheezing RRR no MRG Abd soft ntnd no mass or ascites +bs GU defer MS no joint effusions or deformity Ext no LE or UE edema, no wounds or ulcers Neuro is alert, Ox 3 , nf RUA AVF+bruit    Home meds:  - carvedilol 25 bid  - aspirin 325 qd/ sublingual ntg prn    [place] MWF  4h 5min    350/800  53kg  2/2.25 bath  RUA AVF  P4  Hep 1200  - mircera 75 ug q2w last 4/22 - calcitriol 0.25 ug tiw - auryxia 1 tid ac     Assessment/ Plan: 1. PNA - left LL on CXR.  Pt on IV abx per primary 2. ESRD on HD MWF - plan for HD tomorrow. No vol overload or electrolyte  issues 3. Anemia CKD - Hb 10.3 here, no need esa 4. MBD ckd - cont meds      [facility] 03/12/2019, 11:38 AM

## 2019-03-13 LAB — CBC WITH DIFFERENTIAL/PLATELET
Abs Immature Granulocytes: 0.03 10*3/uL (ref 0.00–0.07)
Basophils Absolute: 0 10*3/uL (ref 0.0–0.1)
Basophils Relative: 0 %
Eosinophils Absolute: 0.1 10*3/uL (ref 0.0–0.5)
Eosinophils Relative: 1 %
HCT: 32.4 % — ABNORMAL LOW (ref 36.0–46.0)
Hemoglobin: 10.2 g/dL — ABNORMAL LOW (ref 12.0–15.0)
Immature Granulocytes: 0 %
Lymphocytes Relative: 9 %
Lymphs Abs: 0.6 10*3/uL — ABNORMAL LOW (ref 0.7–4.0)
MCH: 28 pg (ref 26.0–34.0)
MCHC: 31.5 g/dL (ref 30.0–36.0)
MCV: 89 fL (ref 80.0–100.0)
Monocytes Absolute: 0.5 10*3/uL (ref 0.1–1.0)
Monocytes Relative: 7 %
Neutro Abs: 5.8 10*3/uL (ref 1.7–7.7)
Neutrophils Relative %: 83 %
Platelets: 185 10*3/uL (ref 150–400)
RBC: 3.64 MIL/uL — ABNORMAL LOW (ref 3.87–5.11)
RDW: 15.9 % — ABNORMAL HIGH (ref 11.5–15.5)
WBC: 7.1 10*3/uL (ref 4.0–10.5)
nRBC: 0 % (ref 0.0–0.2)

## 2019-03-13 LAB — COMPREHENSIVE METABOLIC PANEL
ALT: 31 U/L (ref 0–44)
AST: 27 U/L (ref 15–41)
Albumin: 2.9 g/dL — ABNORMAL LOW (ref 3.5–5.0)
Alkaline Phosphatase: 73 U/L (ref 38–126)
Anion gap: 16 — ABNORMAL HIGH (ref 5–15)
BUN: 47 mg/dL — ABNORMAL HIGH (ref 8–23)
CO2: 24 mmol/L (ref 22–32)
Calcium: 8.8 mg/dL — ABNORMAL LOW (ref 8.9–10.3)
Chloride: 93 mmol/L — ABNORMAL LOW (ref 98–111)
Creatinine, Ser: 8.3 mg/dL — ABNORMAL HIGH (ref 0.44–1.00)
GFR calc Af Amer: 5 mL/min — ABNORMAL LOW (ref >60)
GFR calc non Af Amer: 4 mL/min — ABNORMAL LOW (ref >60)
Glucose, Bld: 108 mg/dL — ABNORMAL HIGH (ref 70–99)
Potassium: 3.3 mmol/L — ABNORMAL LOW (ref 3.5–5.1)
Sodium: 133 mmol/L — ABNORMAL LOW (ref 135–145)
Total Bilirubin: 0.5 mg/dL (ref 0.3–1.2)
Total Protein: 5.8 g/dL — ABNORMAL LOW (ref 6.5–8.1)

## 2019-03-13 LAB — GLUCOSE, CAPILLARY: Glucose-Capillary: 91 mg/dL (ref 70–99)

## 2019-03-13 LAB — MRSA PCR SCREENING: MRSA by PCR: NEGATIVE

## 2019-03-13 LAB — INTERLEUKIN-6, PLASMA: Interleukin-6, Plasma: 18.5 pg/mL — ABNORMAL HIGH (ref 0.0–12.2)

## 2019-03-13 LAB — PHOSPHORUS: Phosphorus: 3.9 mg/dL (ref 2.5–4.6)

## 2019-03-13 MED ORDER — VANCOMYCIN HCL IN DEXTROSE 500-5 MG/100ML-% IV SOLN
INTRAVENOUS | Status: AC
  Administered 2019-03-13: 500 mg via INTRAVENOUS
  Filled 2019-03-13: qty 100

## 2019-03-13 MED ORDER — HEPARIN SODIUM (PORCINE) 1000 UNIT/ML DIALYSIS: 1200.0000 [IU] | Freq: Once | INTRAMUSCULAR | Status: DC

## 2019-03-13 MED ORDER — HALOPERIDOL LACTATE 5 MG/ML IJ SOLN
1.0000 mg | Freq: Once | INTRAMUSCULAR | Status: AC
  Administered 2019-03-13: 1 mg via INTRAVENOUS
  Filled 2019-03-13: qty 1

## 2019-03-13 NOTE — Progress Notes (Signed)
Renal Navigator notified OP HD clinic of patient's COVID 19 testing and negative result on 02/20/2019 to ensure continuity of care and safety.  Alphonzo Cruise Renal Navigator [phone]

## 2019-03-13 NOTE — Progress Notes (Signed)
Patient Demographics:    Alexis Washington, is a 85 y.o. female, DOB - [birth year], GQQ:[id number]  Admit date - 03/04/2019   Admitting Physician [name], MD  Outpatient Primary MD for the patient is [name], [name], MD  LOS - 2   No chief complaint on file.       Subjective:    Alexis Washington today has no fevers, no emesis,  No chest pain, confused  Assessment  & Plan :    Active Problems:   ESRD on dialysis [facility])   Hypertension   Hypothyroidism   Dyspnea   Hypokalemia   HCAP (healthcare-associated pneumonia)  Brief Summary:- 85 year old with past medical history significant for HTN, hypothyroidism, ESRD  on hemodialysis (MWF) who presents complaining of shortness of breath while at dialysis day of admission  Plan:- 1)Lt LL PNA--- COVID negative, continue Vanco and cefepime, no fevers or leukocytosis, c/n Bronchiodilators, repeat chest x-ray in a.m.  2) anemia of CKD--- EPO with nephrologist  3) toxic metabolic encephalopathy--- likely due to pneumonia as above #1  4)ESRD--continue HD on Monday Wednesday and Friday, currently close to her dry weight, continue Mircera, Calcitrol and Auryxia   5)HTN--stable, continue amlodipine 5mg  daily, Coreg 25 mg twice daily  6)Hypothyroidism--continue levothyroxine 150 mcg daily  Disposition/Need for in-Hospital Stay- patient unable to be discharged at this time due to pneumonia requiring IV antibiotics, confusion/metabolic encephalopathy  Code Status : Full   Family Communication:   na   Disposition Plan  : TBD  Consults  :  Nephrologist  DVT Prophylaxis  :   - Heparin -   Lab Results  Component Value Date   PLT 185 03/13/2019    Inpatient Medications  Scheduled Meds: . acidophilus  1 capsule Oral Daily  . amLODipine  5 mg Oral Daily  . aspirin EC  325 mg Oral Daily  . calcium acetate  1,334 mg Oral TID WC  . carvedilol  25 mg Oral BID  WC  . Chlorhexidine Gluconate Cloth  6 each Topical Q0600  . diclofenac sodium  2 g Topical BID  . heparin  5,000 Units Subcutaneous Q8H  . levothyroxine  150 mcg Oral Daily  . multivitamin  1 tablet Oral QHS  . pantoprazole  40 mg Oral Daily  . raloxifene  60 mg Oral Daily  . sodium chloride flush  3 mL Intravenous Q12H  . vancomycin  500 mg Intravenous Q M,W,F-HD   Continuous Infusions: . sodium chloride    . ceFEPime (MAXIPIME) IV Stopped (03/13/19 1215)   PRN Meds:.sodium chloride, albuterol, docusate sodium, hydrALAZINE, lidocaine-prilocaine, ondansetron, polyethylene glycol, sodium chloride flush    Anti-infectives (From admission, onward)   Start     Dose/Rate Route Frequency Ordered Stop   03/13/19 2000  ceFEPIme (MAXIPIME) 2 g in sodium chloride 0.9 % 100 mL IVPB     2 g 200 mL/hr over 30 Minutes Intravenous Every M-W-F (2000) 03/12/19 0017     03/13/19 1200  vancomycin (VANCOCIN) IVPB 500 mg/100 ml premix     500 mg 100 mL/hr over 60 Minutes Intravenous Every M-W-F (Hemodialysis) 03/12/19 0017     03/12/19 0030  ceFEPIme (MAXIPIME) 2 g in sodium chloride 0.9 % 100 mL IVPB  2 g 200 mL/hr over 30 Minutes Intravenous  Once 03/12/19 0017 03/12/19 0245   03/12/19 0015  vancomycin (VANCOCIN) IVPB 1000 mg/200 mL premix     1,000 mg 200 mL/hr over 60 Minutes Intravenous  Once 03/12/19 0017 03/12/19 0412        Objective:   Vitals:   03/13/19 1030 03/13/19 1100 03/13/19 1123 03/13/19 1228  BP: (!) 163/67 (!) 171/74 (!) 171/68 (!) 160/68  Pulse: 83 82 90 81  Resp:   20 16  Temp:   98.7 F (37.1 C) 99 F (37.2 C)  TempSrc:   Oral Oral  SpO2:   94% 90%  Weight:   52.9 kg   Height:        Wt Readings from Last 3 Encounters:  03/13/19 52.9 kg  01/03/19 52.8 kg  11/01/18 54.5 kg     Intake/Output Summary (Last 24 hours) at 03/13/2019 1236 Last data filed at 03/13/2019 1123 Gross per 24 hour  Intake -  Output 2001 ml  Net -2001 ml    Physical Exam  Patient is examined daily including today on 03/13/19 , exams remain the same as of yesterday except that has changed   Gen:-Confused, no acute distress HEENT:- [place].AT, No sclera icterus Neck-Supple Neck,No JVD,.  Lungs-diminished in bases no wheezing  CV- S1, S2 normal, regular  Abd-  +ve B.Sounds, Abd Soft, No tenderness,    Extremity/Skin:-  pedal pulses present, RUA AV fistula with positive thrill and bruit Psych-affect is appropriate, oriented x3 Neuro-no new focal deficits, no tremors   Data Review:   Micro Results Recent Results (from the past 240 hour(s))  SARS Coronavirus 2 [facility] order, Performed in [place] hospital lab)     Status: None   Collection Time: 02/15/2019 11:56 PM  Result Value Ref Range Status   SARS Coronavirus 2 NEGATIVE NEGATIVE Final    Comment: (NOTE) If result is NEGATIVE SARS-CoV-2 target nucleic acids are NOT DETECTED. The SARS-CoV-2 RNA is generally detectable in upper and lower  respiratory specimens during the acute phase of infection. The lowest  concentration of SARS-CoV-2 viral copies this assay can detect is 250  copies / mL. A negative result does not preclude SARS-CoV-2 infection  and should not be used as the sole basis for treatment or other  patient management decisions.  A negative result may occur with  improper specimen collection / handling, submission of specimen other  than nasopharyngeal swab, presence of viral mutation(s) within the  areas targeted by this assay, and inadequate number of viral copies  (<250 copies / mL). A negative result must be combined with clinical  observations, patient history, and epidemiological information. If result is POSITIVE SARS-CoV-2 target nucleic acids are DETECTED. The SARS-CoV-2 RNA is generally detectable in upper and lower  respiratory specimens dur ing the acute phase of infection.  Positive  results are indicative of active infection with SARS-CoV-2.  Clinical  correlation with  patient history and other diagnostic information is  necessary to determine patient infection status.  Positive results do  not rule out bacterial infection or co-infection with other viruses. If result is PRESUMPTIVE POSTIVE SARS-CoV-2 nucleic acids MAY BE PRESENT.   A presumptive positive result was obtained on the submitted specimen  and confirmed on repeat testing.  While 2019 novel coronavirus  (SARS-CoV-2) nucleic acids may be present in the submitted sample  additional confirmatory testing may be necessary for epidemiological  and / or clinical management purposes  to differentiate between  SARS-CoV-2 and other Sarbecovirus currently known to infect humans.  If clinically indicated additional testing with an alternate test  methodology [phone]) is advised. The SARS-CoV-2 RNA is generally  detectable in upper and lower respiratory sp ecimens during the acute  phase of infection. The expected result is Negative. Fact Sheet for Patients:  StrictlyIdeas.no Fact Sheet for Healthcare Providers: BankingDealers.co.za This test is not yet approved or cleared by the [place] FDA and has been authorized for detection and/or diagnosis of SARS-CoV-2 by FDA under an Emergency Use Authorization (EUA).  This EUA will remain in effect (meaning this test can be used) for the duration of the COVID-19 declaration under Section 564(b)(1) of the Act, 21 U.S.C. section 360bbb-3(b)(1), unless the authorization is terminated or revoked sooner. Performed at [facility], [place] [address], [place], [place] [address]     Radiology Reports Dg Chest [place] 1 View  Result Date: 03/12/2019 CLINICAL DATA:  Hypoxia, shortness of breath, cough and end-stage renal disease. EXAM: PORTABLE CHEST 1 VIEW COMPARISON:  04/13/2018 FINDINGS: Stable cardiac enlargement. Chronic small left pleural effusion. Additional opacity in the left lower lung may represent  atelectasis or pneumonia. Suggestion of potential mild pulmonary interstitial edema. There also is suggestion of potentially subtle nodular opacities bilaterally in the right upper lobe, right lower perihilar region and left upper perihilar region. Subtle pulmonary nodules are not excluded and follow-up recommended. IMPRESSION: 1. Chronic small left pleural effusion with additional left lower lobe opacity suggestive of atelectasis or pneumonia. 2. Potential mild pulmonary interstitial edema. 3. Suggestion of potential subtle nodular opacities bilaterally. Pulmonary nodules are not excluded and follow-up PA and lateral chest x-ray is recommended. Electronically Signed   By: Aletta Edouard M.D.   On: 03/12/2019 09:09     CBC Recent Labs  Lab 03/12/19 0009 03/13/19 0710  WBC 8.7 7.1  HGB 10.3* 10.2*  HCT 32.5* 32.4*  PLT 168 185  MCV 89.5 89.0  MCH 28.4 28.0  MCHC 31.7 31.5  RDW 16.1* 15.9*  LYMPHSABS 0.8 0.6*  MONOABS 0.5 0.5  EOSABS 0.1 0.1  BASOSABS 0.0 0.0    Chemistries  Recent Labs  Lab 03/12/19 0009 03/13/19 0710  NA 138 133*  K 3.8 3.3*  CL 96* 93*  CO2 30 24  GLUCOSE 109* 108*  BUN 29* 47*  CREATININE 5.47*  5.62* 8.30*  CALCIUM 8.5* 8.8*  AST 60* 27  ALT 48* 31  ALKPHOS 83 73  BILITOT 0.5 0.5   ------------------------------------------------------------------------------------------------------------------ No results for input(s): CHOL, HDL, LDLCALC, TRIG, CHOLHDL, LDLDIRECT in the last 72 hours.  Lab Results  Component Value Date   HGBA1C 4.3 (L) 01/03/2019   ------------------------------------------------------------------------------------------------------------------ No results for input(s): TSH, T4TOTAL, T3FREE, THYROIDAB in the last 72 hours.  Invalid input(s): FREET3 ------------------------------------------------------------------------------------------------------------------ No results for input(s): VITAMINB12, FOLATE, FERRITIN, TIBC, IRON,  RETICCTPCT in the last 72 hours.  Coagulation profile No results for input(s): INR, PROTIME in the last 168 hours.  No results for input(s): DDIMER in the last 72 hours.  Cardiac Enzymes No results for input(s): CKMB, TROPONINI, MYOGLOBIN in the last 168 hours.  Invalid input(s): CK ------------------------------------------------------------------------------------------------------------------    Component Value Date/Time   BNP 2,422.4 (H) 11/08/2017 8182     Roxan Hockey M.D on 03/13/2019 at 12:36 PM  Go to [email or web address] - for contact info  [facility] - Office  [phone]

## 2019-03-13 NOTE — TOC Initial Note (Signed)
Transition of Care [facility]) - Initial/Assessment Note    Patient Details  Name: Alexis Washington MRN: [id number] Date of Birth: 06-01-1932  [facility] of [facility] CM/SW Contact:    Zenon Mayo, RN Phone Number: 03/13/2019, 2:27 PM  Clinical Narrative:                 From home with spouse,  HCAP, ESRD  MWF, Covid negative. NCM tried to speak with patient, she was very sleepy will try to speak with her later.  Expected Discharge Plan: [place] Barriers to Discharge: No Barriers Identified   Patient Goals and CMS Choice Patient states their goals for this hospitalization and ongoing recovery are:: patient sleepy   Choice offered to / list presented to : NA  Expected Discharge Plan and Services Expected Discharge Plan: [place] In-house Referral: NA Discharge Planning Services: CM Consult   Living arrangements for the past 2 months: Single Family Home                 DME Arranged: N/A DME Agency: NA       HH Arranged: NA HH Agency: NA        Prior Living Arrangements/Services Living arrangements for the past 2 months: Single Family Home Lives with:: Spouse                   Activities of Daily Living Home Assistive Devices/Equipment: Environmental consultant (specify type), Shower chair without back, Oxygen ADL Screening (condition at time of admission) Patient's cognitive ability adequate to safely complete daily activities?: Yes Is the patient deaf or have difficulty hearing?: Yes Does the patient have difficulty seeing, even when wearing glasses/contacts?: No Does the patient have difficulty concentrating, remembering, or making decisions?: No Patient able to express need for assistance with ADLs?: Yes Does the patient have difficulty dressing or bathing?: No Independently performs ADLs?: Yes (appropriate for developmental age) Does the patient have difficulty walking or climbing stairs?: No Weakness of Legs: Both Weakness of  Arms/Hands: None  Permission Sought/Granted                  Emotional Assessment   Attitude/Demeanor/Rapport: Unable to Assess Affect (typically observed): Unable to Assess Orientation: : (unable to assess)      Admission diagnosis:  fever cough pneumonia renal failure Patient Active Problem List   Diagnosis Date Noted  . Dyspnea 03/03/2019  . Hypokalemia 02/28/2019  . HCAP (healthcare-associated pneumonia) 02/22/2019  . CVA (cerebral vascular accident) ([place]) 01/04/2019  . AF (paroxysmal atrial fibrillation) ([place]) 01/03/2019  . Acute CVA (cerebrovascular accident) ([place]) 01/02/2019  . Duodenal arteriovenous malformation   . Paroxysmal atrial fibrillation with rapid ventricular response ([place])   . Anemia   . GI bleed 04/13/2018  . Hypertensive renal disease 04/04/2018  . Volume overload 11/08/2017  . Acute respiratory failure with hypoxia ([place]) 05/09/2017  . Hypothyroidism 05/09/2017  . Hypertension   . GERD (gastroesophageal reflux disease)   . Anemia in chronic renal disease   . Hypoxia   . Pleural effusion   . Chest pain 01/04/2017  . Pedal edema 01/04/2017  . ESRD on dialysis ([place]) 08/07/2015  . Pre-op evaluation 02/26/2015  . Iron deficiency 02/04/2015  . 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   PCP:  Rochel Brome, MD Pharmacy:   [facility] [place], [place] [address] [place] [address] Phone: [phone] Fax: [phone]     Social Determinants of Health (SDOH) Interventions    Readmission Risk Interventions Readmission Risk Prevention Plan 03/13/2019  Transportation Screening Complete  Palliative Care Screening Not Applicable  Medication Review (RN Care Manager) Complete  Some recent data might be hidden

## 2019-03-13 NOTE — Plan of Care (Addendum)
Patient alert disoriented x4.  Returned from dialysis, rested well and placed on oxygen 2L. MD aware held meds due to confusion and patient not following commands. Spoke to family daughter expected hypoglycemia, cbg 74. Improved being able to follow commands but still disoriented to name/birthdate. Patient tolerated eating ice cream without difficulty.   Problem: Education: Goal: Knowledge of General Education information will improve Description Including pain rating scale, medication(s)/side effects and non-pharmacologic comfort measures Outcome: Progressing   Problem: Health Behavior/Discharge Planning: Goal: Ability to manage health-related needs will improve Outcome: Progressing   Problem: Clinical Measurements: Goal: Ability to maintain clinical measurements within normal limits will improve Outcome: Progressing Goal: Diagnostic test results will improve Outcome: Progressing Goal: Respiratory complications will improve Outcome: Progressing

## 2019-03-13 NOTE — Progress Notes (Signed)
Spoke with daughter and updated on patient being confused due to pneumonia.

## 2019-03-13 NOTE — Progress Notes (Signed)
[facility] Progress Note  Subjective: confused on HD this am.    Vitals:   03/13/19 1000 03/13/19 1030 03/13/19 1100 03/13/19 1123  BP: (!) 158/63 (!) 163/67 (!) 171/74 (!) 171/68  Pulse: 80 83 82 90  Resp:    20  Temp:    98.7 F (37.1 C)  TempSrc:    Oral  SpO2:    94%  Weight:    52.9 kg  Height:        Inpatient medications: . acidophilus  1 capsule Oral Daily  . amLODipine  5 mg Oral Daily  . aspirin EC  325 mg Oral Daily  . calcium acetate  1,334 mg Oral TID WC  . carvedilol  25 mg Oral BID WC  . Chlorhexidine Gluconate Cloth  6 each Topical Q0600  . diclofenac sodium  2 g Topical BID  . heparin  5,000 Units Subcutaneous Q8H  . levothyroxine  150 mcg Oral Daily  . multivitamin  1 tablet Oral QHS  . pantoprazole  40 mg Oral Daily  . raloxifene  60 mg Oral Daily  . sodium chloride flush  3 mL Intravenous Q12H  . vancomycin  500 mg Intravenous Q M,W,F-HD   . sodium chloride    . ceFEPime (MAXIPIME) IV Stopped (03/13/19 1215)   sodium chloride, albuterol, docusate sodium, hydrALAZINE, lidocaine-prilocaine, ondansetron, polyethylene glycol, sodium chloride flush  Exam: Gen alert, no distress No jvd or bruits Chest clear bilat not rales or wheezing RRR no MRG Abd soft ntnd no mass or ascites +bs Ext no LE or UE edema Neuro is alert, Ox 3 , nf RUA AVF+bruit    Home meds:  - carvedilol 25 bid  - aspirin 325 qd/ sublingual ntg prn    [place] MWF  4h 47min    350/800  53kg  2/2.25 bath  RUA AVF  P4  Hep 1200  - mircera 75 ug q2w last 4/22 - calcitriol 0.25 ug tiw - auryxia 1 tid ac   Assessment/ Plan: 1. PNA - IV abx per primary 2. ESRD: on HD MWF. HD today. At dry wt 3. Anemia CKD - Hb > 10, no need esa 4. MBD ckd - cont meds 5. Confusion - no meds implicated, ?delirium, follow   [facility] 03/13/2019, 12:21 PM  Iron/TIBC/Ferritin/ %Sat    Component Value Date/Time   IRON 29 04/16/2018 0816   TIBC 164 (L) 04/16/2018 0816   FERRITIN 780 (H) 04/16/2018 0816   IRONPCTSAT 18 04/16/2018 0816   Recent Labs  Lab 03/13/19 0710  NA 133*  K 3.3*  CL 93*  CO2 24  GLUCOSE 108*  BUN 47*  CREATININE 8.30*  CALCIUM 8.8*  PHOS 3.9  ALBUMIN 2.9*   Recent Labs  Lab 03/13/19 0710  AST 27  ALT 31  ALKPHOS 73  BILITOT 0.5  PROT 5.8*   Recent Labs  Lab 03/13/19 0710  WBC 7.1  HGB 10.2*  HCT 32.4*  PLT 185

## 2019-03-13 NOTE — Progress Notes (Signed)
Pt having difficulty thinking and talking this morning but has only had a couple hours of sleep last 2 nights. Pt very tired. Pt doesn't want a "sleeping pill." Maybe Benadryl or Melatonin would help and Don't disturb from 2300-0500??

## 2019-03-14 ENCOUNTER — Ambulatory Visit: Payer: PPO | Admitting: Sports Medicine

## 2019-03-14 ENCOUNTER — Inpatient Hospital Stay (HOSPITAL_COMMUNITY): Payer: PPO

## 2019-03-14 DIAGNOSIS — Z992 Dependence on renal dialysis: Secondary | ICD-10-CM | POA: Diagnosis not present

## 2019-03-14 DIAGNOSIS — N269 Renal sclerosis, unspecified: Secondary | ICD-10-CM | POA: Diagnosis not present

## 2019-03-14 DIAGNOSIS — N186 End stage renal disease: Secondary | ICD-10-CM | POA: Diagnosis not present

## 2019-03-14 DIAGNOSIS — E039 Hypothyroidism, unspecified: Secondary | ICD-10-CM

## 2019-03-14 LAB — CBC WITH DIFFERENTIAL/PLATELET
Abs Immature Granulocytes: 0.02 10*3/uL (ref 0.00–0.07)
Basophils Absolute: 0 10*3/uL (ref 0.0–0.1)
Basophils Relative: 0 %
Eosinophils Absolute: 0.1 10*3/uL (ref 0.0–0.5)
Eosinophils Relative: 1 %
HCT: 35.1 % — ABNORMAL LOW (ref 36.0–46.0)
Hemoglobin: 10.8 g/dL — ABNORMAL LOW (ref 12.0–15.0)
Immature Granulocytes: 0 %
Lymphocytes Relative: 10 %
Lymphs Abs: 0.6 10*3/uL — ABNORMAL LOW (ref 0.7–4.0)
MCH: 27.5 pg (ref 26.0–34.0)
MCHC: 30.8 g/dL (ref 30.0–36.0)
MCV: 89.3 fL (ref 80.0–100.0)
Monocytes Absolute: 0.6 10*3/uL (ref 0.1–1.0)
Monocytes Relative: 9 %
Neutro Abs: 4.8 10*3/uL (ref 1.7–7.7)
Neutrophils Relative %: 80 %
Platelets: 176 10*3/uL (ref 150–400)
RBC: 3.93 MIL/uL (ref 3.87–5.11)
RDW: 15.7 % — ABNORMAL HIGH (ref 11.5–15.5)
WBC: 6.1 10*3/uL (ref 4.0–10.5)
nRBC: 0 % (ref 0.0–0.2)

## 2019-03-14 LAB — COMPREHENSIVE METABOLIC PANEL
ALT: 30 U/L (ref 0–44)
AST: 32 U/L (ref 15–41)
Albumin: 3 g/dL — ABNORMAL LOW (ref 3.5–5.0)
Alkaline Phosphatase: 70 U/L (ref 38–126)
Anion gap: 17 — ABNORMAL HIGH (ref 5–15)
BUN: 18 mg/dL (ref 8–23)
CO2: 22 mmol/L (ref 22–32)
Calcium: 8.8 mg/dL — ABNORMAL LOW (ref 8.9–10.3)
Chloride: 95 mmol/L — ABNORMAL LOW (ref 98–111)
Creatinine, Ser: 4.95 mg/dL — ABNORMAL HIGH (ref 0.44–1.00)
GFR calc Af Amer: 9 mL/min — ABNORMAL LOW (ref >60)
GFR calc non Af Amer: 7 mL/min — ABNORMAL LOW (ref >60)
Glucose, Bld: 87 mg/dL (ref 70–99)
Potassium: 3.7 mmol/L (ref 3.5–5.1)
Sodium: 134 mmol/L — ABNORMAL LOW (ref 135–145)
Total Bilirubin: 0.9 mg/dL (ref 0.3–1.2)
Total Protein: 6 g/dL — ABNORMAL LOW (ref 6.5–8.1)

## 2019-03-14 MED ORDER — ACETAMINOPHEN 650 MG RE SUPP
650.0000 mg | RECTAL | Status: DC | PRN
  Administered 2019-03-14: 650 mg via RECTAL
  Filled 2019-03-14: qty 1

## 2019-03-14 MED ORDER — HALOPERIDOL LACTATE 5 MG/ML IJ SOLN
5.0000 mg | Freq: Once | INTRAMUSCULAR | Status: AC
  Administered 2019-03-14: 5 mg via INTRAMUSCULAR
  Filled 2019-03-14: qty 1

## 2019-03-14 MED ORDER — AMOXICILLIN-POT CLAVULANATE 500-125 MG PO TABS
500.0000 mg | ORAL_TABLET | ORAL | Status: DC
  Filled 2019-03-14: qty 1

## 2019-03-14 MED ORDER — CHLORHEXIDINE GLUCONATE CLOTH 2 % EX PADS
6.0000 | MEDICATED_PAD | Freq: Every day | CUTANEOUS | Status: DC
  Administered 2019-03-15: 6 via TOPICAL

## 2019-03-14 NOTE — Progress Notes (Signed)
Paged night shift MD to call patient's daugther at request of daughter

## 2019-03-14 NOTE — Progress Notes (Signed)
Patient Demographics:    Alexis Washington, is a 85 y.o. female, DOB - Oct 20, 1932, VQX:[id number]  Admit date - 03/05/2019   Admitting Physician [name], MD  Outpatient Primary MD for the patient is [name], [name], MD  LOS - 3   No chief complaint on file.       Subjective:    [name] today has no fevers, no emesis,  No chest pain, remains confused, disoriented  Assessment  & Plan :    Active Problems:   ESRD on dialysis [facility])   Hypertension   Hypothyroidism   Dyspnea   Hypokalemia   HCAP (healthcare-associated pneumonia)  Brief Summary:- 85 year old with past medical history significant for HTN, hypothyroidism, ESRD  on hemodialysis (MWF) who presents complaining of shortness of breath while at dialysis day of admission.... Confusion and disorientation persist  Plan:- 1)Lt LL PNA--- COVID negative, auscultatively improving, repeat chest x-ray on 03/14/2019 shows improvement, MRSA screen negative, so discontinue IV  Vanco, de-escalate from IV Cefepime, okay to treat empirically with Augmentin started on 03/14/2019, no fevers or leukocytosis, c/n Bronchiodilators,   2) anemia of CKD--- EPO with nephrologist  3) toxic metabolic encephalopathy--- likely due to pneumonia as above #1...... patient increasingly more confused, etiology unclear, cefepime may have contributed, this has been discontinued,... continue Safety and fall precautions  4)ESRD--continue HD on Monday Wednesday and Friday, currently close to her dry weight, continue Mircera, Calcitrol and Auryxia per nephrology team  5)HTN--stable, continue amlodipine 5mg  daily, Coreg 25 mg twice daily  6)Hypothyroidism--continue levothyroxine 150 mcg daily  Disposition/Need for in-Hospital Stay- patient unable to be discharged at this time due to pneumonia requiring IV antibiotics, worsening confusion/metabolic encephalopathy  Code Status :  Full   Family Communication:   na   Disposition Plan  : TBD  Consults  :  Nephrologist  DVT Prophylaxis  :   - Heparin -   Lab Results  Component Value Date   PLT 176 03/14/2019    Inpatient Medications  Scheduled Meds: . acidophilus  1 capsule Oral Daily  . amLODipine  5 mg Oral Daily  . amoxicillin-clavulanate  500 mg Oral Daily  . aspirin EC  325 mg Oral Daily  . calcium acetate  1,334 mg Oral TID WC  . carvedilol  25 mg Oral BID WC  . Chlorhexidine Gluconate Cloth  6 each Topical Q0600  . [START ON 03/15/2019] Chlorhexidine Gluconate Cloth  6 each Topical Q0600  . diclofenac sodium  2 g Topical BID  . heparin  5,000 Units Subcutaneous Q8H  . levothyroxine  150 mcg Oral Daily  . multivitamin  1 tablet Oral QHS  . pantoprazole  40 mg Oral Daily  . raloxifene  60 mg Oral Daily  . sodium chloride flush  3 mL Intravenous Q12H   Continuous Infusions: . sodium chloride Stopped (03/13/19 1600)   PRN Meds:.sodium chloride, acetaminophen, albuterol, docusate sodium, hydrALAZINE, lidocaine-prilocaine, ondansetron, polyethylene glycol, sodium chloride flush    Anti-infectives (From admission, onward)   Start     Dose/Rate Route Frequency Ordered Stop   03/14/19 2000  amoxicillin-clavulanate (AUGMENTIN) 500-125 MG per tablet 500 mg     500 mg Oral Daily 03/14/19 1125     03/13/19 2000  ceFEPIme (MAXIPIME)  2 g in sodium chloride 0.9 % 100 mL IVPB  Status:  Discontinued     2 g 200 mL/hr over 30 Minutes Intravenous Every M-W-F (2000) 03/12/19 0017 03/14/19 1125   03/13/19 1200  vancomycin (VANCOCIN) IVPB 500 mg/100 ml premix  Status:  Discontinued     500 mg 100 mL/hr over 60 Minutes Intravenous Every M-W-F (Hemodialysis) 03/12/19 0017 03/14/19 1536   03/12/19 0030  ceFEPIme (MAXIPIME) 2 g in sodium chloride 0.9 % 100 mL IVPB     2 g 200 mL/hr over 30 Minutes Intravenous  Once 03/12/19 0017 03/12/19 0245   03/12/19 0015  vancomycin (VANCOCIN) IVPB 1000 mg/200 mL premix      1,000 mg 200 mL/hr over 60 Minutes Intravenous  Once 03/12/19 0017 03/12/19 0412        Objective:   Vitals:   03/13/19 2308 03/13/19 2321 03/14/19 0740 03/14/19 1500  BP: (!) 163/65 (!) 160/67 (!) 169/87 (!) 154/56  Pulse: 84  86   Resp: (!) 24  17   Temp: 98.7 F (37.1 C)  98 F (36.7 C)   TempSrc: Oral  Oral   SpO2: (!) 87% 96% 95%   Weight:      Height:        Wt Readings from Last 3 Encounters:  03/13/19 52.9 kg  01/03/19 52.8 kg  11/01/18 54.5 kg     Intake/Output Summary (Last 24 hours) at 03/14/2019 1620 Last data filed at 03/14/2019 0300 Gross per 24 hour  Intake 200 ml  Output -  Net 200 ml    Physical Exam Patient is examined daily including today on 03/14/19 , exams remain the same as of yesterday except that has changed   Gen:-Confused, no acute distress HEENT:- Miranda.AT, No sclera icterus Neck-Supple Neck,No JVD,.  Lungs-diminished in bases no wheezing  CV- S1, S2 normal, regular  Abd-  +ve B.Sounds, Abd Soft, No tenderness,  ???  Prominent abdomen  Extremity/Skin:-  pedal pulses present, RUA AV fistula with positive thrill and bruit Psych-affect is confused and disoriented  neuro-no new focal deficits, no tremors   Data Review:   Micro Results Recent Results (from the past 240 hour(s))  SARS Coronavirus 2 [facility] order, Performed in [place] hospital lab)     Status: None   Collection Time: 02/20/2019 11:56 PM  Result Value Ref Range Status   SARS Coronavirus 2 NEGATIVE NEGATIVE Final    Comment: (NOTE) If result is NEGATIVE SARS-CoV-2 target nucleic acids are NOT DETECTED. The SARS-CoV-2 RNA is generally detectable in upper and lower  respiratory specimens during the acute phase of infection. The lowest  concentration of SARS-CoV-2 viral copies this assay can detect is 250  copies / mL. A negative result does not preclude SARS-CoV-2 infection  and should not be used as the sole basis for treatment or other  patient management decisions.  A  negative result may occur with  improper specimen collection / handling, submission of specimen other  than nasopharyngeal swab, presence of viral mutation(s) within the  areas targeted by this assay, and inadequate number of viral copies  (<250 copies / mL). A negative result must be combined with clinical  observations, patient history, and epidemiological information. If result is POSITIVE SARS-CoV-2 target nucleic acids are DETECTED. The SARS-CoV-2 RNA is generally detectable in upper and lower  respiratory specimens dur ing the acute phase of infection.  Positive  results are indicative of active infection with SARS-CoV-2.  Clinical  correlation with patient  history and other diagnostic information is  necessary to determine patient infection status.  Positive results do  not rule out bacterial infection or co-infection with other viruses. If result is PRESUMPTIVE POSTIVE SARS-CoV-2 nucleic acids MAY BE PRESENT.   A presumptive positive result was obtained on the submitted specimen  and confirmed on repeat testing.  While 2019 novel coronavirus  (SARS-CoV-2) nucleic acids may be present in the submitted sample  additional confirmatory testing may be necessary for epidemiological  and / or clinical management purposes  to differentiate between  SARS-CoV-2 and other Sarbecovirus currently known to infect humans.  If clinically indicated additional testing with an alternate test  methodology [id number]) is advised. The SARS-CoV-2 RNA is generally  detectable in upper and lower respiratory sp ecimens during the acute  phase of infection. The expected result is Negative. Fact Sheet for Patients:  StrictlyIdeas.no Fact Sheet for Healthcare Providers: BankingDealers.co.za This test is not yet approved or cleared by the [place] FDA and has been authorized for detection and/or diagnosis of SARS-CoV-2 by FDA under an Emergency Use  Authorization (EUA).  This EUA will remain in effect (meaning this test can be used) for the duration of the COVID-19 declaration under Section 564(b)(1) of the Act, 21 U.S.C. section 360bbb-3(b)(1), unless the authorization is terminated or revoked sooner. Performed at [facility], [place] [address], [place], [place] [address]   MRSA PCR Screening     Status: None   Collection Time: 03/13/19  5:21 PM  Result Value Ref Range Status   MRSA by PCR NEGATIVE NEGATIVE Final    Comment:        The GeneXpert MRSA Assay (FDA approved for NASAL specimens only), is one component of a comprehensive MRSA colonization surveillance program. It is not intended to diagnose MRSA infection nor to guide or monitor treatment for MRSA infections. Performed at [facility], [place] [address], [place], [place] [address]     Radiology Reports Dg Chest 1 View  Result Date: 03/14/2019 CLINICAL DATA:  Shortness of breath. EXAM: CHEST  1 VIEW COMPARISON:  03/12/2019 FINDINGS: Stable cardiac enlargement. Consolidation within the left lower lobe appears improved. There remains a small left pleural effusion. Stable probable chronic lung disease and mild interstitial prominence which may also reflect a component of mild chronic interstitial edema. The suggestion nodular opacities seen on the prior chest x-ray is somewhat less prominent on today's chest x-ray. IMPRESSION: Improvement in left lower lobe consolidation with residual small left pleural effusion. Chronic interstitial prominence which may reflect a component of mild chronic interstitial edema. Electronically Signed   By: Aletta Edouard M.D.   On: 03/14/2019 08:02   Dg Chest Port 1 View  Result Date: 03/12/2019 CLINICAL DATA:  Hypoxia, shortness of breath, cough and end-stage renal disease. EXAM: PORTABLE CHEST 1 VIEW COMPARISON:  04/13/2018 FINDINGS: Stable cardiac enlargement. Chronic small left pleural effusion. Additional opacity in the left  lower lung may represent atelectasis or pneumonia. Suggestion of potential mild pulmonary interstitial edema. There also is suggestion of potentially subtle nodular opacities bilaterally in the right upper lobe, right lower perihilar region and left upper perihilar region. Subtle pulmonary nodules are not excluded and follow-up recommended. IMPRESSION: 1. Chronic small left pleural effusion with additional left lower lobe opacity suggestive of atelectasis or pneumonia. 2. Potential mild pulmonary interstitial edema. 3. Suggestion of potential subtle nodular opacities bilaterally. Pulmonary nodules are not excluded and follow-up PA and lateral chest x-ray is recommended. Electronically Signed  By: Aletta Edouard M.D.   On: 03/12/2019 09:09     CBC Recent Labs  Lab 03/12/19 0009 03/13/19 0710 03/14/19 0413  WBC 8.7 7.1 6.1  HGB 10.3* 10.2* 10.8*  HCT 32.5* 32.4* 35.1*  PLT 168 185 176  MCV 89.5 89.0 89.3  MCH 28.4 28.0 27.5  MCHC 31.7 31.5 30.8  RDW 16.1* 15.9* 15.7*  LYMPHSABS 0.8 0.6* 0.6*  MONOABS 0.5 0.5 0.6  EOSABS 0.1 0.1 0.1  BASOSABS 0.0 0.0 0.0    Chemistries  Recent Labs  Lab 03/12/19 0009 03/13/19 0710 03/14/19 0413  NA 138 133* 134*  K 3.8 3.3* 3.7  CL 96* 93* 95*  CO2 $Re'30 24 22  'UNv$ GLUCOSE 109* 108* 87  BUN 29* 47* 18  CREATININE 5.47*  5.62* 8.30* 4.95*  CALCIUM 8.5* 8.8* 8.8*  AST 60* 27 32  ALT 48* 31 30  ALKPHOS 83 73 70  BILITOT 0.5 0.5 0.9   ------------------------------------------------------------------------------------------------------------------ No results for input(s): CHOL, HDL, LDLCALC, TRIG, CHOLHDL, LDLDIRECT in the last 72 hours.  Lab Results  Component Value Date   HGBA1C 4.3 (L) 01/03/2019   ------------------------------------------------------------------------------------------------------------------ No results for input(s): TSH, T4TOTAL, T3FREE, THYROIDAB in the last 72 hours.  Invalid input(s): FREET3  ------------------------------------------------------------------------------------------------------------------ No results for input(s): VITAMINB12, FOLATE, FERRITIN, TIBC, IRON, RETICCTPCT in the last 72 hours.  Coagulation profile No results for input(s): INR, PROTIME in the last 168 hours.  No results for input(s): DDIMER in the last 72 hours.  Cardiac Enzymes No results for input(s): CKMB, TROPONINI, MYOGLOBIN in the last 168 hours.  Invalid input(s): CK ------------------------------------------------------------------------------------------------------------------    Component Value Date/Time   BNP 2,422.4 (H) 11/08/2017 5643     Roxan Hockey M.D on 03/14/2019 at 4:20 PM  Go to [email or web address] - for contact info  [facility] - Office  [phone]

## 2019-03-14 NOTE — Progress Notes (Signed)
Pt disoriented x 4. Yelling out "Baby" and nonsensical words and noises. Refused morning pills. Pills then crushed in pudding. Pt took one bite and spit meds all over rn. Refusing anymore PO intake. Pt holding head in pain. BP elevated and IV hydralazine given. No pain medications ordered. Emokpae MD paged and made aware of situation. Per callback rectal tylenol to be ordered and given. Safety precautions remain in effect with bed alarm on and floor mats in place.

## 2019-03-14 NOTE — Progress Notes (Signed)
Daily Patient Note Received report at 1600 on miss [place] fr[place], [place]. Patient notably delirious upon assessment, remains to moan and continues to be restless. Noted to have not voided throughout the day, straight cath with (+)238m output. Voltaren gel applied to back. Performed patient massage with body lotion to ease discomfort. Music therapy utilized for delirium. Will continue to monitor carefully given level of delirium. Patient care needs met during shift.   Imaging: KUB (03/14/2019): No evidence of ileus, obstruction or free air. Clips in the right upper quadrant consistent with previous cholecystectomy. Amount of fecal matter is within normal limits. Ordinary curvature in degenerative change of the spine is seen.  DC Plan: Discharge when medically optimized.

## 2019-03-14 NOTE — Progress Notes (Signed)
Paged MD make aware that patient is very confuse and disoriented. Pills crushed in apple sauce. When placed up to patient mouth she spit med and apple sauce back out.

## 2019-03-14 NOTE — Care Management Important Message (Signed)
Important Message  Patient Details  Name: Alexis Washington MRN: [id number] Date of Birth: Apr 10, 1932   Medicare Important Message Given:  Yes    Zenon Mayo, RN 03/14/2019, 3:03 PM

## 2019-03-14 NOTE — Progress Notes (Signed)
Paged K.Kirby. "Pt very confused, Dx3. Climbing out of bed and agitated.   Order placed for one time haldol IV.   Pt now resting comfortably. Will continue to monitor.

## 2019-03-14 NOTE — Progress Notes (Signed)
[facility] Progress Note  Subjective: remains confused today, very confused   Vitals:   03/13/19 2308 03/13/19 2321 03/14/19 0740 03/14/19 1500  BP: (!) 163/65 (!) 160/67 (!) 169/87 (!) 154/56  Pulse: 84  86   Resp: (!) 24  17   Temp: 98.7 F (37.1 C)  98 F (36.7 C)   TempSrc: Oral  Oral   SpO2: (!) 87% 96% 95%   Weight:      Height:        Inpatient medications: . acidophilus  1 capsule Oral Daily  . amLODipine  5 mg Oral Daily  . amoxicillin-clavulanate  500 mg Oral Daily  . aspirin EC  325 mg Oral Daily  . calcium acetate  1,334 mg Oral TID WC  . carvedilol  25 mg Oral BID WC  . Chlorhexidine Gluconate Cloth  6 each Topical Q0600  . diclofenac sodium  2 g Topical BID  . heparin  5,000 Units Subcutaneous Q8H  . levothyroxine  150 mcg Oral Daily  . multivitamin  1 tablet Oral QHS  . pantoprazole  40 mg Oral Daily  . raloxifene  60 mg Oral Daily  . sodium chloride flush  3 mL Intravenous Q12H  . vancomycin  500 mg Intravenous Q M,W,F-HD   . sodium chloride Stopped (03/13/19 1600)   sodium chloride, acetaminophen, albuterol, docusate sodium, hydrALAZINE, lidocaine-prilocaine, ondansetron, polyethylene glycol, sodium chloride flush  Exam: Gen alert, no distress, very confused No jvd or bruits Chest clear bilat not rales or wheezing RRR no MRG Abd soft ntnd no mass or ascites +bs Ext no LE or UE edema Neuro is alert, Ox 3 , nf RUA AVF+bruit    Home meds:  - carvedilol 25 bid  - aspirin 325 qd/ sublingual ntg prn    [place] MWF  4h 64min    350/800  53kg  2/2.25 bath  RUA AVF  P4  Hep 1200  - mircera 75 ug q2w last 4/22 - calcitriol 0.25 ug tiw - auryxia 1 tid ac   Assessment/ Plan: 1. PNA - IV abx per primary 2. ESRD: on HD MWF. HD today. At dry wt 3. Anemia CKD - Hb > 10, no need esa 4. MBD ckd - cont meds 5. Confusion/ delirium - ?cefipime, will change/ switch to augmentin   [facility] 03/14/2019,  3:17 PM  Iron/TIBC/Ferritin/ %Sat    Component Value Date/Time   IRON 29 04/16/2018 0816   TIBC 164 (L) 04/16/2018 0816   FERRITIN 780 (H) 04/16/2018 0816   IRONPCTSAT 18 04/16/2018 0816   Recent Labs  Lab 03/13/19 0710 03/14/19 0413  NA 133* 134*  K 3.3* 3.7  CL 93* 95*  CO2 24 22  GLUCOSE 108* 87  BUN 47* 18  CREATININE 8.30* 4.95*  CALCIUM 8.8* 8.8*  PHOS 3.9  --   ALBUMIN 2.9* 3.0*   Recent Labs  Lab 03/14/19 0413  AST 32  ALT 30  ALKPHOS 70  BILITOT 0.9  PROT 6.0*   Recent Labs  Lab 03/14/19 0413  WBC 6.1  HGB 10.8*  HCT 35.1*  PLT 176

## 2019-03-15 ENCOUNTER — Inpatient Hospital Stay (HOSPITAL_COMMUNITY): Payer: PPO

## 2019-03-15 DIAGNOSIS — G92 Toxic encephalopathy: Secondary | ICD-10-CM

## 2019-03-15 DIAGNOSIS — R569 Unspecified convulsions: Secondary | ICD-10-CM

## 2019-03-15 DIAGNOSIS — G4733 Obstructive sleep apnea (adult) (pediatric): Secondary | ICD-10-CM

## 2019-03-15 DIAGNOSIS — Z7189 Other specified counseling: Secondary | ICD-10-CM

## 2019-03-15 DIAGNOSIS — R401 Stupor: Secondary | ICD-10-CM

## 2019-03-15 DIAGNOSIS — R0603 Acute respiratory distress: Secondary | ICD-10-CM

## 2019-03-15 LAB — CBC WITH DIFFERENTIAL/PLATELET
Abs Immature Granulocytes: 0.05 10*3/uL (ref 0.00–0.07)
Basophils Absolute: 0 10*3/uL (ref 0.0–0.1)
Basophils Relative: 0 %
Eosinophils Absolute: 0 10*3/uL (ref 0.0–0.5)
Eosinophils Relative: 0 %
HCT: 36.2 % (ref 36.0–46.0)
Hemoglobin: 11.2 g/dL — ABNORMAL LOW (ref 12.0–15.0)
Immature Granulocytes: 1 %
Lymphocytes Relative: 9 %
Lymphs Abs: 0.6 10*3/uL — ABNORMAL LOW (ref 0.7–4.0)
MCH: 27.7 pg (ref 26.0–34.0)
MCHC: 30.9 g/dL (ref 30.0–36.0)
MCV: 89.6 fL (ref 80.0–100.0)
Monocytes Absolute: 0.6 10*3/uL (ref 0.1–1.0)
Monocytes Relative: 9 %
Neutro Abs: 5.6 10*3/uL (ref 1.7–7.7)
Neutrophils Relative %: 81 %
Platelets: 190 10*3/uL (ref 150–400)
RBC: 4.04 MIL/uL (ref 3.87–5.11)
RDW: 16 % — ABNORMAL HIGH (ref 11.5–15.5)
WBC: 6.9 10*3/uL (ref 4.0–10.5)
nRBC: 0 % (ref 0.0–0.2)

## 2019-03-15 LAB — COMPREHENSIVE METABOLIC PANEL
ALT: 29 U/L (ref 0–44)
AST: 27 U/L (ref 15–41)
Albumin: 3 g/dL — ABNORMAL LOW (ref 3.5–5.0)
Alkaline Phosphatase: 72 U/L (ref 38–126)
Anion gap: 19 — ABNORMAL HIGH (ref 5–15)
BUN: 38 mg/dL — ABNORMAL HIGH (ref 8–23)
CO2: 21 mmol/L — ABNORMAL LOW (ref 22–32)
Calcium: 9 mg/dL (ref 8.9–10.3)
Chloride: 98 mmol/L (ref 98–111)
Creatinine, Ser: 7.89 mg/dL — ABNORMAL HIGH (ref 0.44–1.00)
GFR calc Af Amer: 5 mL/min — ABNORMAL LOW (ref >60)
GFR calc non Af Amer: 4 mL/min — ABNORMAL LOW (ref >60)
Glucose, Bld: 87 mg/dL (ref 70–99)
Potassium: 3.9 mmol/L (ref 3.5–5.1)
Sodium: 138 mmol/L (ref 135–145)
Total Bilirubin: 1.2 mg/dL (ref 0.3–1.2)
Total Protein: 6 g/dL — ABNORMAL LOW (ref 6.5–8.1)

## 2019-03-15 LAB — BLOOD GAS, ARTERIAL
Acid-Base Excess: 2.4 mmol/L — ABNORMAL HIGH (ref 0.0–2.0)
Bicarbonate: 26.3 mmol/L (ref 20.0–28.0)
Drawn by: 257081
O2 Content: 6 L/min
O2 Saturation: 93.6 %
Patient temperature: 98.6
pCO2 arterial: 39.5 mmHg (ref 32.0–48.0)
pH, Arterial: 7.438 (ref 7.350–7.450)
pO2, Arterial: 70.7 mmHg — ABNORMAL LOW (ref 83.0–108.0)

## 2019-03-15 LAB — AMMONIA: Ammonia: 28 umol/L (ref 9–35)

## 2019-03-15 MED ORDER — SODIUM CHLORIDE 0.9 % IV SOLN: 250.0000 mL | INTRAVENOUS | Status: DC | PRN

## 2019-03-15 MED ORDER — GLYCOPYRROLATE 0.2 MG/ML IJ SOLN
0.2000 mg | INTRAMUSCULAR | Status: DC | PRN
  Administered 2019-03-15: 0.2 mg via INTRAVENOUS
  Filled 2019-03-15: qty 1

## 2019-03-15 MED ORDER — LEVETIRACETAM IN NACL 1000 MG/100ML IV SOLN
1000.0000 mg | Freq: Two times a day (BID) | INTRAVENOUS | Status: DC
  Administered 2019-03-15: 1000 mg via INTRAVENOUS
  Filled 2019-03-15 (×2): qty 100

## 2019-03-15 MED ORDER — LORAZEPAM 2 MG/ML IJ SOLN
INTRAMUSCULAR | Status: AC
  Administered 2019-03-15: 2 mg via INTRAVENOUS
  Filled 2019-03-15: qty 1

## 2019-03-15 MED ORDER — HEPARIN SODIUM (PORCINE) 1000 UNIT/ML DIALYSIS
1200.0000 [IU] | Freq: Once | INTRAMUSCULAR | Status: AC
  Administered 2019-03-15: 1200 [IU] via INTRAVENOUS_CENTRAL

## 2019-03-15 MED ORDER — BISACODYL 10 MG RE SUPP: 10.0000 mg | Freq: Every day | RECTAL | Status: DC | PRN

## 2019-03-15 MED ORDER — SODIUM CHLORIDE 0.9% FLUSH
3.0000 mL | Freq: Two times a day (BID) | INTRAVENOUS | Status: DC
  Administered 2019-03-15 – 2019-03-16 (×3): 3 mL via INTRAVENOUS

## 2019-03-15 MED ORDER — CHLORHEXIDINE GLUCONATE CLOTH 2 % EX PADS: 6.0000 | MEDICATED_PAD | Freq: Every day | CUTANEOUS | Status: DC

## 2019-03-15 MED ORDER — ONDANSETRON 4 MG PO TBDP: 4.0000 mg | ORAL_TABLET | Freq: Four times a day (QID) | ORAL | Status: DC | PRN

## 2019-03-15 MED ORDER — IPRATROPIUM-ALBUTEROL 0.5-2.5 (3) MG/3ML IN SOLN
3.0000 mL | Freq: Four times a day (QID) | RESPIRATORY_TRACT | Status: DC
  Administered 2019-03-15 – 2019-03-16 (×3): 3 mL via RESPIRATORY_TRACT
  Filled 2019-03-15 (×3): qty 3

## 2019-03-15 MED ORDER — GLYCOPYRROLATE 0.2 MG/ML IJ SOLN: 0.2000 mg | INTRAMUSCULAR | Status: DC | PRN

## 2019-03-15 MED ORDER — ONDANSETRON HCL 4 MG/2ML IJ SOLN: 4.0000 mg | Freq: Four times a day (QID) | INTRAMUSCULAR | Status: DC | PRN

## 2019-03-15 MED ORDER — SODIUM CHLORIDE 0.9% FLUSH: 3.0000 mL | INTRAVENOUS | Status: DC | PRN

## 2019-03-15 MED ORDER — LORAZEPAM 2 MG/ML IJ SOLN: 1.0000 mg | INTRAMUSCULAR | Status: DC | PRN

## 2019-03-15 MED ORDER — LORAZEPAM 2 MG/ML PO CONC: 1.0000 mg | ORAL | Status: DC | PRN

## 2019-03-15 MED ORDER — LORAZEPAM 2 MG/ML IJ SOLN
2.0000 mg | Freq: Once | INTRAMUSCULAR | Status: DC
  Administered 2019-03-15: 2 mg via INTRAVENOUS

## 2019-03-15 MED ORDER — LORAZEPAM 2 MG/ML IJ SOLN: 1.0000 mg | Freq: Once | INTRAMUSCULAR | Status: DC

## 2019-03-15 MED ORDER — HEPARIN SODIUM (PORCINE) 1000 UNIT/ML IJ SOLN
INTRAMUSCULAR | Status: AC
  Administered 2019-03-15: 1200 [IU] via INTRAVENOUS_CENTRAL
  Filled 2019-03-15: qty 2

## 2019-03-15 MED ORDER — MORPHINE SULFATE (PF) 2 MG/ML IV SOLN
2.0000 mg | INTRAVENOUS | Status: DC | PRN
  Administered 2019-03-15 – 2019-03-16 (×4): 2 mg via INTRAVENOUS
  Filled 2019-03-15 (×4): qty 1

## 2019-03-15 MED ORDER — SODIUM CHLORIDE 0.9 % IV SOLN
1.5000 g | Freq: Two times a day (BID) | INTRAVENOUS | Status: DC
  Filled 2019-03-15 (×2): qty 1.5

## 2019-03-15 MED ORDER — GLYCOPYRROLATE 1 MG PO TABS: 1.0000 mg | ORAL_TABLET | ORAL | Status: DC | PRN

## 2019-03-15 MED ORDER — LORAZEPAM 1 MG PO TABS: 1.0000 mg | ORAL_TABLET | ORAL | Status: DC | PRN

## 2019-03-15 MED ORDER — ALBUTEROL SULFATE (2.5 MG/3ML) 0.083% IN NEBU: 2.5000 mg | INHALATION_SOLUTION | RESPIRATORY_TRACT | Status: DC | PRN

## 2019-03-15 NOTE — Progress Notes (Signed)
This RN spoke to patient's daughter, Alexis Washington, via phone.  Alexis Washington states she is concerned about her mother after speaking to the physician this morning but she was unable to hear many of the details because of background noise.  I relayed that her mother's breathing status was compromised and that she was working very hard to breathe and that she was responding to pain but would not open her eyes.  This was a definite change for her mother and the daughter was very tearful at the information.  Daughter relayed that she would like to come see patient as she would like to have closure if her mother's condition worsens.  At this point daughter and patient's spouse are agreeable that they would like patient to move from a full code to moving toward performing intubation and utilizing drugs ONLY and NOT performing compressions.  This information was relayed to Dr. Denton Brick.  Family is in route to the hospital at this time.

## 2019-03-15 NOTE — Progress Notes (Signed)
Patient Demographics:    Alexis Washington, is a 85 y.o. female, DOB - 1932-09-27, SWF:[id number]  Admit date - 03/13/2019   Admitting Physician [name], MD  Outpatient Primary MD for the patient is [name], [name], MD  LOS - 4   No chief complaint on file.       Subjective:    [name] today has no fevers, no emesis,   remains confused, disoriented--- patient had episode of unresponsiveness, shaking movements with concerns about possible seizures while in hemodialysis--- she was taken of the estimation, given lorazepam, start CT head without acute findings, EEG with metabolic changes but no definite epileptiform findings--- neurology and pulmonary critical care consult requested---- after conversations with pulmonary critical care and neurology service.... Patient family would like to pursue comfort care measures only, patient is now a DNR/DNI with no aggressive treatment protocols.... Please see documentation from pulmonary critical care attending Dr [name] and nursing documentation   Assessment  & Plan :    Principal Problem:   HCAP (healthcare-associated pneumonia) Active Problems:   ESRD on dialysis ([place])   Hypertension   Acute respiratory failure with hypoxia (HCC)   Hypothyroidism   Paroxysmal atrial fibrillation with rapid ventricular response (HCC)   Dyspnea   Hypokalemia  Brief Summary:- 85 year old with past medical history significant for HTN, hypothyroidism, ESRD  on hemodialysis (MWF) who presents complaining of shortness of breath while at dialysis day of admission.... Confusion and disorientation persist  Plan:- 1) toxic metabolic encephalopathy/possible seizures--- neurology and pulmonary critical care consult appreciated, initially received lorazepam and Keppra, EEG with encephalopathic findings without definite epileptiform findings---  after conversations with pulmonary critical  care and neurology service.... Patient family would like to pursue comfort care measures only, patient is now a DNR/DNI with no aggressive treatment protocols.... Please see documentation from pulmonary critical care attending Dr [name] and nursing documentation  2)Lt LL PNA--- COVID negative, repeat chest x-ray on 03/14/2019 showed improvement, MRSA screen negative, so   IV  Vanco were discontinued, de-escalated from IV Cefepime to Augmentin started on 03/14/2019, no fevers or leukocytosis, c/n Bronchiodilators--- after conversations with pulmonary critical care and neurology service.... Patient family would like to pursue comfort care measures only, patient is now a DNR/DNI with no aggressive treatment protocols.... Please see documentation from pulmonary critical care attending Dr [name] and nursing documentation  3)Anemia of CKD--- comfort care measures only per family request  4)ESRD--previously on HD on Monday Wednesday and Friday, patient was also on Mircera, Calcitrol and Auryxia per nephrology team--- comfort care measures only per family request  5)HTN--previously on amlodipine $RemoveBefor'5mg'[id number]$  daily and  Coreg 25 mg twice daily- comfort care measures only per family request  6)Hypothyroidism--comfort care measures only per family request  Disposition/Need for in-Hospital Stay- patient unable to be discharged at this time due to   worsening confusion/metabolic encephalopathy--comfort care measures only per family request   Code Status : -DNI/DNR- Family Communication:   - comfort care measures only per family request   Disposition Plan  : Await further palliative care input Consults  :  Nephrologist/pulmonary critical care and neurology  DVT Prophylaxis  :   -Comfort care Lab Results  Component Value Date   PLT 190 03/15/2019    Inpatient  Medications  Scheduled Meds:  ipratropium-albuterol  3 mL Nebulization Q6H   sodium chloride flush  3 mL Intravenous Q12H   Continuous  Infusions:  sodium chloride     levETIRAcetam 1,000 mg (03/15/19 1032)   PRN Meds:.sodium chloride, albuterol, bisacodyl, glycopyrrolate **OR** glycopyrrolate **OR** glycopyrrolate, LORazepam **OR** [DISCONTINUED] LORazepam **OR** LORazepam, morphine injection, ondansetron **OR** ondansetron (ZOFRAN) IV, sodium chloride flush    Anti-infectives (From admission, onward)   Start     Dose/Rate Route Frequency Ordered Stop   03/15/19 1230  ampicillin-sulbactam (UNASYN) 1.5 g in sodium chloride 0.9 % 100 mL IVPB  Status:  Discontinued     1.5 g 200 mL/hr over 30 Minutes Intravenous Every 12 hours 03/15/19 1224 03/15/19 1618   03/14/19 2000  amoxicillin-clavulanate (AUGMENTIN) 500-125 MG per tablet 500 mg  Status:  Discontinued     500 mg Oral Daily 03/14/19 1125 03/15/19 1204   03/13/19 2000  ceFEPIme (MAXIPIME) 2 g in sodium chloride 0.9 % 100 mL IVPB  Status:  Discontinued     2 g 200 mL/hr over 30 Minutes Intravenous Every M-W-F (2000) 03/12/19 0017 03/14/19 1125   03/13/19 1200  vancomycin (VANCOCIN) IVPB 500 mg/100 ml premix  Status:  Discontinued     500 mg 100 mL/hr over 60 Minutes Intravenous Every M-W-F (Hemodialysis) 03/12/19 0017 03/14/19 1536   03/12/19 0030  ceFEPIme (MAXIPIME) 2 g in sodium chloride 0.9 % 100 mL IVPB     2 g 200 mL/hr over 30 Minutes Intravenous  Once 03/12/19 0017 03/12/19 0245   03/12/19 0015  vancomycin (VANCOCIN) IVPB 1000 mg/200 mL premix     1,000 mg 200 mL/hr over 60 Minutes Intravenous  Once 03/12/19 0017 03/12/19 0412        Objective:   Vitals:   03/15/19 1005 03/15/19 1154 03/15/19 1339 03/15/19 1619  BP: (!) 180/69 (!) 183/85 (!) 85/49   Pulse: 92 75  82  Resp: (!) 23 18  (!) 28  Temp: 97.7 F (36.5 C)     TempSrc: Oral     SpO2: 91% 97%  98%  Weight:      Height:        Wt Readings from Last 3 Encounters:  03/15/19 52.2 kg  01/03/19 52.8 kg  11/01/18 54.5 kg     Intake/Output Summary (Last 24 hours) at 03/15/2019 1654 Last  data filed at 03/15/2019 0854 Gross per 24 hour  Intake --  Output -211 ml  Net 211 ml    Physical Exam Patient is examined daily including today on 03/15/19 , exams remain the same as of yesterday except that has changed   Gen:-Confused, resting comfortably after lorazepam, no acute distress HEENT:- [place].AT, No sclera icterus Neck-Supple Neck,No JVD,.  Lungs-diminished in bases no wheezing  CV- S1, S2 normal, regular  Abd-  +ve B.Sounds, Abd Soft, No tenderness,  Extremity/Skin:-  pedal pulses present, RUA AV fistula with positive thrill and bruit Psych-lethargic after lorazepam neuro-no new focal deficits, no further seizure type activity  Data Review:   Micro Results Recent Results (from the past 240 hour(s))  SARS Coronavirus 2 [facility] order, Performed in [facility] lab)     Status: None   Collection Time: 02/15/2019 11:56 PM  Result Value Ref Range Status   SARS Coronavirus 2 NEGATIVE NEGATIVE Final    Comment: (NOTE) If result is NEGATIVE SARS-CoV-2 target nucleic acids are NOT DETECTED. The SARS-CoV-2 RNA is generally detectable in upper and lower  respiratory specimens during the  acute phase of infection. The lowest  concentration of SARS-CoV-2 viral copies this assay can detect is 250  copies / mL. A negative result does not preclude SARS-CoV-2 infection  and should not be used as the sole basis for treatment or other  patient management decisions.  A negative result may occur with  improper specimen collection / handling, submission of specimen other  than nasopharyngeal swab, presence of viral mutation(s) within the  areas targeted by this assay, and inadequate number of viral copies  (<250 copies / mL). A negative result must be combined with clinical  observations, patient history, and epidemiological information. If result is POSITIVE SARS-CoV-2 target nucleic acids are DETECTED. The SARS-CoV-2 RNA is generally detectable in upper and lower  respiratory  specimens dur ing the acute phase of infection.  Positive  results are indicative of active infection with SARS-CoV-2.  Clinical  correlation with patient history and other diagnostic information is  necessary to determine patient infection status.  Positive results do  not rule out bacterial infection or co-infection with other viruses. If result is PRESUMPTIVE POSTIVE SARS-CoV-2 nucleic acids MAY BE PRESENT.   A presumptive positive result was obtained on the submitted specimen  and confirmed on repeat testing.  While 2019 novel coronavirus  (SARS-CoV-2) nucleic acids may be present in the submitted sample  additional confirmatory testing may be necessary for epidemiological  and / or clinical management purposes  to differentiate between  SARS-CoV-2 and other Sarbecovirus currently known to infect humans.  If clinically indicated additional testing with an alternate test  methodology [phone]) is advised. The SARS-CoV-2 RNA is generally  detectable in upper and lower respiratory sp ecimens during the acute  phase of infection. The expected result is Negative. Fact Sheet for Patients:  StrictlyIdeas.no Fact Sheet for Healthcare Providers: BankingDealers.co.za This test is not yet approved or cleared by the [place] FDA and has been authorized for detection and/or diagnosis of SARS-CoV-2 by FDA under an Emergency Use Authorization (EUA).  This EUA will remain in effect (meaning this test can be used) for the duration of the COVID-19 declaration under Section 564(b)(1) of the Act, 21 U.S.C. section 360bbb-3(b)(1), unless the authorization is terminated or revoked sooner. Performed at [facility], [place] [address], [place], [place] [address]   MRSA PCR Screening     Status: None   Collection Time: 03/13/19  5:21 PM  Result Value Ref Range Status   MRSA by PCR NEGATIVE NEGATIVE Final    Comment:        The GeneXpert MRSA  Assay (FDA approved for NASAL specimens only), is one component of a comprehensive MRSA colonization surveillance program. It is not intended to diagnose MRSA infection nor to guide or monitor treatment for MRSA infections. Performed at [facility], [place] [address], [place], [place] [address]     Radiology Reports Dg Chest 1 View  Result Date: 03/14/2019 CLINICAL DATA:  Shortness of breath. EXAM: CHEST  1 VIEW COMPARISON:  03/12/2019 FINDINGS: Stable cardiac enlargement. Consolidation within the left lower lobe appears improved. There remains a small left pleural effusion. Stable probable chronic lung disease and mild interstitial prominence which may also reflect a component of mild chronic interstitial edema. The suggestion nodular opacities seen on the prior chest x-ray is somewhat less prominent on today's chest x-ray. IMPRESSION: Improvement in left lower lobe consolidation with residual small left pleural effusion. Chronic interstitial prominence which may reflect a component of mild chronic interstitial edema. Electronically  Signed   By: Aletta Edouard M.D.   On: 03/14/2019 08:02   Ct Head Wo Contrast  Result Date: 03/15/2019 CLINICAL DATA:  Seizure. EXAM: CT HEAD WITHOUT CONTRAST TECHNIQUE: Contiguous axial images were obtained from the base of the skull through the vertex without intravenous contrast. COMPARISON:  None. FINDINGS: Brain: Mild chronic ischemic white matter disease is noted. No mass effect or midline shift is noted. Ventricular size is within normal limits. There is no evidence of mass lesion, hemorrhage or acute infarction. Vascular: No hyperdense vessel or unexpected calcification. Skull: Normal. Negative for fracture or focal lesion. Sinuses/Orbits: No acute finding. Other: None. IMPRESSION: Mild chronic ischemic white matter disease. No acute intracranial abnormality seen. Electronically Signed   By: Marijo Conception M.D.   On: 03/15/2019 09:48   Dg Chest Port 1  View  Result Date: 03/12/2019 CLINICAL DATA:  Hypoxia, shortness of breath, cough and end-stage renal disease. EXAM: PORTABLE CHEST 1 VIEW COMPARISON:  04/13/2018 FINDINGS: Stable cardiac enlargement. Chronic small left pleural effusion. Additional opacity in the left lower lung may represent atelectasis or pneumonia. Suggestion of potential mild pulmonary interstitial edema. There also is suggestion of potentially subtle nodular opacities bilaterally in the right upper lobe, right lower perihilar region and left upper perihilar region. Subtle pulmonary nodules are not excluded and follow-up recommended. IMPRESSION: 1. Chronic small left pleural effusion with additional left lower lobe opacity suggestive of atelectasis or pneumonia. 2. Potential mild pulmonary interstitial edema. 3. Suggestion of potential subtle nodular opacities bilaterally. Pulmonary nodules are not excluded and follow-up PA and lateral chest x-ray is recommended. Electronically Signed   By: Aletta Edouard M.D.   On: 03/12/2019 09:09   Dg Abd 2 Views  Result Date: 03/14/2019 CLINICAL DATA:  Generalized abdominal pain. EXAM: ABDOMEN - 2 VIEW COMPARISON:  05/12/2017 FINDINGS: No evidence of ileus, obstruction or free air. Clips in the right upper quadrant consistent with previous cholecystectomy. Amount of fecal matter is within normal limits. Ordinary curvature in degenerative change of the spine is seen. IMPRESSION: Negative radiography. Electronically Signed   By: Nelson Chimes M.D.   On: 03/14/2019 17:14     CBC Recent Labs  Lab 03/12/19 0009 03/13/19 0710 03/14/19 0413 03/15/19 0446  WBC 8.7 7.1 6.1 6.9  HGB 10.3* 10.2* 10.8* 11.2*  HCT 32.5* 32.4* 35.1* 36.2  PLT 168 185 176 190  MCV 89.5 89.0 89.3 89.6  MCH 28.4 28.0 27.5 27.7  MCHC 31.7 31.5 30.8 30.9  RDW 16.1* 15.9* 15.7* 16.0*  LYMPHSABS 0.8 0.6* 0.6* 0.6*  MONOABS 0.5 0.5 0.6 0.6  EOSABS 0.1 0.1 0.1 0.0  BASOSABS 0.0 0.0 0.0 0.0    Chemistries  Recent Labs   Lab 03/12/19 0009 03/13/19 0710 03/14/19 0413 03/15/19 0446  NA 138 133* 134* 138  K 3.8 3.3* 3.7 3.9  CL 96* 93* 95* 98  CO2 $Re'30 24 22 'DLL$ 21*  GLUCOSE 109* 108* 87 87  BUN 29* 47* 18 38*  CREATININE 5.47*   5.62* 8.30* 4.95* 7.89*  CALCIUM 8.5* 8.8* 8.8* 9.0  AST 60* 27 32 27  ALT 48* $Remov'31 30 29  'KVDfAx$ ALKPHOS 83 73 70 72  BILITOT 0.5 0.5 0.9 1.2   ------------------------------------------------------------------------------------------------------------------ No results for input(s): CHOL, HDL, LDLCALC, TRIG, CHOLHDL, LDLDIRECT in the last 72 hours.  Lab Results  Component Value Date   HGBA1C 4.3 (L) 01/03/2019   ------------------------------------------------------------------------------------------------------------------ No results for input(s): TSH, T4TOTAL, T3FREE, THYROIDAB in the last 72 hours.  Invalid input(s): FREET3 ------------------------------------------------------------------------------------------------------------------  No results for input(s): VITAMINB12, FOLATE, FERRITIN, TIBC, IRON, RETICCTPCT in the last 72 hours.  Coagulation profile No results for input(s): INR, PROTIME in the last 168 hours.  No results for input(s): DDIMER in the last 72 hours.  Cardiac Enzymes No results for input(s): CKMB, TROPONINI, MYOGLOBIN in the last 168 hours.  Invalid input(s): CK ------------------------------------------------------------------------------------------------------------------    Component Value Date/Time   BNP 2,422.4 (H) 11/08/2017 0177     Roxan Hockey M.D on 03/15/2019 at 4:54 PM  Go to [email or web address] - for contact info  [facility] - Office  [phone]

## 2019-03-15 NOTE — Procedures (Signed)
Alexis A. Alexis Laughter, MD     [email or web address]           HISTORY: The patient is a 85 year old female who presents with convulsions and altered mental status concerning for seizures.  MEDICATIONS:  Current Facility-Administered Medications:  .  0.9 %  sodium chloride infusion, 250 mL, Intravenous, PRN, [name], MD, Stopped at 03/13/19 1600 .  acetaminophen (TYLENOL) suppository 650 mg, 650 mg, Rectal, Q4H PRN, Denton Brick, Courage, MD, 650 mg at 03/14/19 0858 .  acidophilus (RISAQUAD) capsule 1 capsule, 1 capsule, Oral, Daily, [name], MD, 1 capsule at 03/13/19 1629 .  albuterol (VENTOLIN HFA) 108 (90 Base) MCG/ACT inhaler 2 puff, 2 puff, Inhalation, Q6H PRN, [name], MD .  amLODipine (NORVASC) tablet 5 mg, 5 mg, Oral, Daily, Regalado, Belkys A, MD, 5 mg at 03/13/19 1629 .  ampicillin-sulbactam (UNASYN) 1.5 g in sodium chloride 0.9 % 100 mL IVPB, 1.5 g, Intravenous, Q12H, Emokpae, Courage, MD .  aspirin EC tablet 325 mg, 325 mg, Oral, Daily, [name], MD, 325 mg at 03/13/19 1628 .  calcium acetate (PHOSLO) capsule 1,334 mg, 1,334 mg, Oral, TID WC, [name], MD, 1,334 mg at 03/13/19 1629 .  carvedilol (COREG) tablet 25 mg, 25 mg, Oral, BID WC, [name], MD, 25 mg at 03/13/19 1628 .  Chlorhexidine Gluconate Cloth 2 % PADS 6 each, 6 each, Topical, Q0600, Roney Jaffe, MD, 6 each at 03/15/19 [phone] .  Chlorhexidine Gluconate Cloth 2 % PADS 6 each, 6 each, Topical, Q0600, Roney Jaffe, MD, 6 each at 03/15/19 [phone] .  diclofenac sodium (VOLTAREN) 1 % transdermal gel 2 g, 2 g, Topical, BID, Regalado, Belkys A, MD, 2 g at 03/14/19 2148 .  docusate sodium (COLACE) capsule 100 mg, 100 mg, Oral, BID PRN, [name], MD .  heparin injection 5,000 Units, 5,000 Units, Subcutaneous, Q8H, [name], MD, 5,000 Units at 03/15/19 [phone] .  hydrALAZINE (APRESOLINE) injection 10 mg, 10 mg, Intravenous, Q6H PRN, [name], MD, 10 mg at 03/14/19 8119 .  levETIRAcetam  (KEPPRA) IVPB 1000 mg/100 mL premix, 1,000 mg, Intravenous, Q12H, Emokpae, Courage, MD, Last Rate: 400 mL/hr at 03/15/19 1032, 1,000 mg at 03/15/19 1032 .  levothyroxine (SYNTHROID) tablet 150 mcg, 150 mcg, Oral, Daily, [name], MD, 150 mcg at 03/13/19 0603 .  lidocaine-prilocaine (EMLA) cream 1 application, 1 application, Topical, PRN, [name], MD .  LORazepam (ATIVAN) injection 1 mg, 1 mg, Intravenous, Once, Emokpae, Courage, MD .  multivitamin (RENA-VIT) tablet 1 tablet, 1 tablet, Oral, QHS, [name], MD, 1 tablet at 03/13/19 2107 .  ondansetron (ZOFRAN) injection 4 mg, 4 mg, Intravenous, Q6H PRN, Kirby-Graham, Karsten Fells, NP, 4 mg at 03/13/19 1748 .  pantoprazole (PROTONIX) EC tablet 40 mg, 40 mg, Oral, Daily, [name], MD, 40 mg at 03/13/19 1628 .  polyethylene glycol (MIRALAX / GLYCOLAX) packet 17 g, 17 g, Oral, Daily PRN, [name], MD .  raloxifene (EVISTA) tablet 60 mg, 60 mg, Oral, Daily, [name], MD, 60 mg at 03/13/19 1629 .  sodium chloride flush (NS) 0.9 % injection 3 mL, 3 mL, Intravenous, Q12H, [name], MD, 3 mL at 03/15/19 1319 .  sodium chloride flush (NS) 0.9 % injection 3 mL, 3 mL, Intravenous, PRN, [name], MD     ANALYSIS: A 16 channel recording using standard 10 20 measurements is conducted for 25 minutes.  The background activity gets as high as 5 Hz.  The recording is replete with generalized delta slowing often  rhythmic.  There is very frequent triphasic waves with central maximum activity.  These are often associated with generalized vertex sharp wave.  Photic stimulation and hyperventilation are not conducted.  There is no focal or lateral slowing.  There is no epileptiform activity is observed.   IMPRESSION: 1.  This recording shows moderate global slowing indicating a moderate global encephalopathy. 2.  Frequent triphasic waves is also noted which is classically seen in hepatic encephalopathy and/or renal encephalopathy. 3.  Generalized rhythmic  delta activity which is typically seen in metabolic processes.      Alexis Washington A. Alexis Washington, M.D.  Diplomate, Tax adviser of Psychiatry and Neurology ( Neurology).

## 2019-03-15 NOTE — Significant Event (Addendum)
Rapid Response Event Note  Overview: Neurologic and Respiratory  Initial Focused Assessment: Called by Renal PA and MD to evaluate patient for respiratory distress and seizure like activity. Upon arrival, patient had a gaze to LT, rhythmic movement in all extremities, appeared to be seizing. Snoring respirations, + sleep apnea/obstruction. TRH MD was called. Ativan 2 mg IV x 1 given, rhythmic movement stopped. Patient was somnolent, only withdrew from pain in all extremities. RR was in he mid 68s, increased WOB noticed as well.   Interventions: -- Ativan 2 mg IV -- STAT ABG -- STAT HEAD CT.  Plan of Care: Patient was brought back to 2W per MD. + Snoring respirations, still had slight gaze. I was concerned that patient might be in status, if so, I felt like the patient was not protecting her airway and might need to be intubated. Neuro MD was consulted. I was called away to a medical emergency at 1005. When I came back at 74, [facility] MD was present on unit. Patient appears worse to me, more labored and she seems to working harder to breathe. VSS. PCCM was consulted. There had some discussions with the family by the nursing staff. Per PCCM MD, will trial patient on BIPAP while waiting for family to come. If the family wishes to proceed with intubation, will transfer to [place], if not will stay on 2W on BIPAP as patient can tolerate. I was called away to another patient at 1159.  Event Summary:  Call Time 0849 Arrival Time 0851 End Time 1200  Keighley Deckman R

## 2019-03-15 NOTE — Consult Note (Signed)
PULMONARY / CRITICAL CARE MEDICINE   NAME:  Alexis Washington, MRN:  [id number], DOB:  1932-08-27, LOS: 4 ADMISSION DATE:  02/24/2019, CONSULTATION DATE: 03/15/2019 REFERRING MD: [facility], CHIEF COMPLAINT: Acute respiratory distress following altered mental status and seizure  BRIEF HISTORY:    End-stage renal disease with new onset of seizures HISTORY OF PRESENT ILLNESS   Alexis Washington is a 85 year old female who was transferred from [facility] on 02/20/2019 for dyspnea following hemodialysis.  She has been treated for suspected pneumonia with cefepime while in hemodialysis on 03/15/2019 developed seizure activity decreased level of consciousness.  Hemodialysis was stopped at that time.  She was transferred to 2 W. after having CT of the head that showed no acute abnormality.  Suspected to be postictal from seizure activity.  She was airway was being blocked by tongue but improved with noninvasive mechanical ventilatory support.  CODE STATUS has been changed to partial she can be intubated given drugs.  No shock and no CPR.  The family is in route at the time of this dictation for questionable full no CODE BLUE with no intubation. SIGNIFICANT PAST MEDICAL HISTORY   End-stage renal disease with dialysis catheter right bicep Hypertension Pleural effusion Hypothyroidism Hypertension   SIGNIFICANT EVENTS:  03/15/2019 seizure STUDIES:   10/15/2019 CT head with no acute abnormality CULTURES:  03/13/2019 SARS coronavirus negative  ANTIBIOTICS:  03/15/2019 Unasyn>>  LINES/TUBES:    CONSULTANTS:  03/15/2019 pulmonary critical care SUBJECTIVE:  85 year old female new onset of altered mental status suspected seizure and inability to protect airway.  Should be left in progressive care unit treated with noninvasive mechanical ventilatory support  CONSTITUTIONAL: BP (!) 180/69 (BP Location: Left Arm)   Pulse 92   Temp 97.7 F (36.5 C) (Oral)   Resp (!) 23   Ht $R'5\' 2"'iD$  (1.575 m)   Wt 52.2 kg   SpO2 91%    BMI 21.05 kg/m   I/O last 3 completed shifts: In: 100 [IV Piggyback:100] Out: -         PHYSICAL EXAM: General: Frail elderly female who is has a decreased level of consciousness grunts and moans when stimulated Neuro: Does not follow commands.  Noted to have rigid upper extremities.  Disconjugate gaze. HEENT: Pupils equal react to light.  Arcus senilis is noted Cardiovascular: Heart sounds are distant Lungs: Decreased breath sounds.  She is blocking her airway but time will utilize noninvasive mechanical ventilatory support to open her airway Abdomen: Nontender Musculoskeletal: Rigid upper extremities Skin: Dry and intact  RESOLVED PROBLEM LIST   ASSESSMENT AND PLAN   Acute respiratory decompensation with congestion and obstructive airway.  In the setting of altered mental status. Plan: Transfer to intensive care unit Attempt to use mechanical noninvasive support to offset obstructive breathing pattern She may need intubation  Decrease in level of consciousness consistent with metabolic encephalopathy status post seizure and postictal phase. Plan: Appreciate neurology's input Transfer to the neuro logical intensive care unit CT of the head showed no acute abnormality  End-stage renal disease with dialysis Monday Wednesdays and Fridays Plan: Per nephrology  Suspected pneumonia questionable aspiration Plan: She was on cefepime and this was felt to be adding to her altered mental status therefore was discontinued and Augmentin started.  We will stop Augmentin due to altered mental status and use Unasyn IV and renal dose.  Hypothyroidism Plan: Synthroid will need to be changed to IV if she does not improve.   SUMMARY OF TODAY'S PLAN:  85 year old female with altered  mental status respiratory distress being transferred to intensive care unit hopefully goals of care as will be established she will not be intubated we will try to put her on noninvasive mechanical  ventilatory support.  Best Practice / Goals of Care / Disposition.   DVT PROPHYLAXIS: Subcu heparin SUP: PPI NUTRITION: P.o. MOBILITY: Bedrest GOALS OF CARE: Partial code intubation medications are okay no CPR or shock FAMILY DISCUSSIONS: 03/15/2019 family is in route to see patient she may be transition to comfort care DISPOSITION transfer to intensive care unit at this time.  LABS  Glucose Recent Labs  Lab 03/13/19 1520  GLUCAP 91    BMET Recent Labs  Lab 03/13/19 0710 03/14/19 0413 03/15/19 0446  NA 133* 134* 138  K 3.3* 3.7 3.9  CL 93* 95* 98  CO2 24 22 21*  BUN 47* 18 38*  CREATININE 8.30* 4.95* 7.89*  GLUCOSE 108* 87 87    Liver Enzymes Recent Labs  Lab 03/13/19 0710 03/14/19 0413 03/15/19 0446  AST 27 32 27  ALT $Re'31 30 29  'oxi$ ALKPHOS 73 70 72  BILITOT 0.5 0.9 1.2  ALBUMIN 2.9* 3.0* 3.0*    Electrolytes Recent Labs  Lab 03/13/19 0710 03/14/19 0413 03/15/19 0446  CALCIUM 8.8* 8.8* 9.0  PHOS 3.9  --   --     CBC Recent Labs  Lab 03/13/19 0710 03/14/19 0413 03/15/19 0446  WBC 7.1 6.1 6.9  HGB 10.2* 10.8* 11.2*  HCT 32.4* 35.1* 36.2  PLT 185 176 190    ABG Recent Labs  Lab 03/15/19 0918  PHART 7.438  PCO2ART 39.5  PO2ART 70.7*    Coag's No results for input(s): APTT, INR in the last 168 hours.  Sepsis Markers No results for input(s): LATICACIDVEN, PROCALCITON, O2SATVEN in the last 168 hours.  Cardiac Enzymes No results for input(s): TROPONINI, PROBNP in the last 168 hours.  PAST MEDICAL HISTORY :   She  has a past medical history of Anemia in chronic renal disease, Arthritis, Benign lipomatous neoplasm of kidney, Complication of anesthesia (1998), Diverticulitis, ESRD (end stage renal disease) on dialysis ([place]), GERD (gastroesophageal reflux disease), Heart murmur, HOH (hard of hearing), Hyperkalemia, Hyperphosphatemia, Hypertension, Hypertensive kidney disease, Hypothyroidism, Nail-patella syndrome, PONV (postoperative nausea and  vomiting), Proteinuria, and Shortness of breath dyspnea.  PAST SURGICAL HISTORY:  She  has a past surgical history that includes Abdominal hysterectomy; Cholecystectomy (1998); Colonoscopy; AV fistula placement (Right, 08/10/2015); Insertion of dialysis catheter (N/A, 08/10/2015); Fistula superficialization (Right, 08/10/2015); Cataract extraction w/ intraocular lens  implant, bilateral; Tonsillectomy; Appendectomy; Bascilic vein transposition (Right, 12/15/2015); IR THORACENTESIS ASP PLEURAL SPACE W/IMG GUIDE (05/11/2017); Esophagogastroduodenoscopy (egd) with propofol (N/A, 04/14/2018); Givens capsule study (N/A, 04/14/2018); enteroscopy (N/A, 04/17/2018); and Hot hemostasis (N/A, 04/17/2018).  Allergies  Allergen Reactions  . Codeine Nausea And Vomiting  . Novocain [Procaine] Palpitations    No current facility-administered medications on file prior to encounter.    Current Outpatient Medications on File Prior to Encounter  Medication Sig  . acetaminophen (TYLENOL) 500 MG tablet Take 500 mg by mouth every 8 (eight) hours as needed for mild pain.  Marland Kitchen aspirin EC 325 MG tablet Take 1 tablet (325 mg total) by mouth daily. Start on 02/05/2019 (Patient taking differently: Take 325 mg by mouth at bedtime. )  . atorvastatin (LIPITOR) 10 MG tablet Take 10 mg by mouth at bedtime.  . calcium acetate (PHOSLO) 667 MG capsule Take 1,334 mg by mouth 3 (three) times daily.   . carvedilol (COREG) 25 MG tablet  Take 1 tablet (25 mg total) by mouth 2 (two) times daily.  . Chlorpheniramine-Acetaminophen (CORICIDIN HBP COLD/FLU PO) Take 1 tablet by mouth as needed (cold symptoms).  . docusate sodium (COLACE) 100 MG capsule Take 1 capsule (100 mg total) by mouth 2 (two) times daily as needed for mild constipation.  . fluticasone (FLONASE) 50 MCG/ACT nasal spray Place 1 spray into both nostrils as needed for allergies or rhinitis.  . folic acid-vitamin b complex-vitamin c-selenium-zinc (DIALYVITE) 3 MG TABS tablet Take 1 tablet  by mouth daily.  Marland Kitchen levothyroxine (SYNTHROID, LEVOTHROID) 150 MCG tablet Take 150 mcg by mouth daily.   . Lidocaine-Prilocaine, Bulk, 2.5-2.5 % CREA Apply 1 application topically as needed (before dialysis).  . nitroGLYCERIN (NITROSTAT) 0.4 MG SL tablet Place 1 tablet (0.4 mg total) under the tongue every 5 (five) minutes as needed.  Marland Kitchen omeprazole (PRILOSEC) 40 MG capsule Take 40 mg by mouth daily.  Marland Kitchen OVER THE COUNTER MEDICATION Place 1 drop into both eyes daily as needed (allergy symptoms). Allergy Relief eye drops  . polyethylene glycol (MIRALAX / GLYCOLAX) packet Take 17 g by mouth daily as needed. (Patient taking differently: Take 17 g by mouth daily as needed for mild constipation. )  . Probiotic Product (DIGESTIVE ADVANTAGE) CAPS Take 1 capsule by mouth daily.  . raloxifene (EVISTA) 60 MG tablet Take 60 mg by mouth daily.    FAMILY HISTORY:   Her family history includes Heart attack in her father; Heart disease in her father; Hyperlipidemia in her father; Hypertension in her father and mother.  SOCIAL HISTORY:  She  reports that she has never smoked. She has never used smokeless tobacco. She reports that she does not drink alcohol or use drugs.  REVIEW OF SYSTEMS:    Not available secondary to altered mental status  Richardson Landry Minor ACNP Maryanna Shape PCCM Pager [phone] till 1 pm If no answer page [phone] 03/15/2019, 11:51 AM

## 2019-03-15 NOTE — Progress Notes (Signed)
   03/15/19 1348  Clinical Encounter Type  Visited With Patient and family together;Health care provider  Visit Type Initial;Spiritual support  Referral From Nurse  Consult/Referral To Chaplain  This chaplain was spiritually present with the Pt. and family as the MD discussed the Pt. diagnosis and health choices with the family.  The chaplain listened as the the MD emphasized quality of life in the Pt. choices.  The family shared the Pt. strong [employer] faith tradition with the chaplain. The chaplain and family joined in prayer together. This chaplain will refer questions on visitation to the Pt. RN-Shannon.  The chaplain will provide F/U spiritual care as needed.

## 2019-03-15 NOTE — Consult Note (Signed)
   [facility] CM Inpatient Consult   03/15/2019  Agape Hardiman 1932/01/18 [id number]   Patient screened for high risk score for unplanned hospitalizations to check if potential [place] Management needs. Patient was hospitalized for endstage renal disease on dialysisand found to have LLL pneumonia.  Patient is lethargic with possible mental status changes noted.  Chart review reveals no disposition noted at this time. Will follow for progression.  Patient with [employer].   Please place a [facility] Care Management consult or for questions contact:   Natividad Brood, RN BSN [facility] Liaison  [phone] business mobile phone Toll free office [phone]  Fax number: [phone] [email or web address]  [email or web address]

## 2019-03-15 NOTE — Progress Notes (Signed)
EEG Completed; Results Pending  

## 2019-03-15 NOTE — Significant Event (Signed)
Rapid Response Event Note  I called for an update at 39, family came, PCCM saw the family, patient was made DNR/DNI, PCT were consulted. Around 1550, I called the nurse for update, per nurse, patient seems like she is not tolerating the BIPAP, the DD went in to talk to family. I suggested that patient be transitioned off BIPAP to NRB and administer morphine for air hungry/WOB.   Camey Edell R

## 2019-03-15 NOTE — Consult Note (Signed)
Requesting Physician: Dr. Theresa Duty    Chief Complaint: Seizures/Unresponsive  History obtained from: Chart  Review and bedside nurse   HPI:                                                                                                                                       Alexis Washington is an 85 y.o. female with with past medical history of end-stage renal disease on dialysis, hypertension who presented to [facility] with shortness of breath, found to have lower lobe pneumonia.  She was being treated with cefepime and have been encephalopathic since today 4/29.  Cefepime was stopped yesterday and she was switched to Augmentin. Today she was undergoing dialysis when she started having generalized convulsions.  Rapid response was called and patient received Ativan.  Rapid response states that she is having jerking of upper and lower extremities with gaze deviation to the left.  She has had no further seizures after receiving Ativan.  Dialysis was aborted and neurology was consulted.  On assessment, patient is lethargic but withdraws briskly to noxious stimulus and groans to pain.  Noted to have labored breathing and critical care consulted for airway management and possible extubation.   Past Medical History:  Diagnosis Date  . Anemia in chronic renal disease    ESRD- Sees Dr Justin Mend  . Arthritis   . Benign lipomatous neoplasm of kidney   . Complication of anesthesia 1998   "blood pressure dropped low during surgery and after surgery it went up really high."   . Diverticulitis   . ESRD (end stage renal disease) on dialysis [facility])    "MWF; [place]" (11/08/2017)  . GERD (gastroesophageal reflux disease)   . Heart murmur    PCP- said it is nothing to be concerned  . HOH (hard of hearing)   . Hyperkalemia   . Hyperphosphatemia   . Hypertension   . Hypertensive kidney disease   . Hypothyroidism   . Nail-patella syndrome   . PONV (postoperative nausea and vomiting)   .  Proteinuria   . Shortness of breath dyspnea     Past Surgical History:  Procedure Laterality Date  . ABDOMINAL HYSTERECTOMY    . APPENDECTOMY    . AV FISTULA PLACEMENT Right 08/10/2015   Procedure: Right Arm Arteriovenous Brachio-cephalic Fistula ;  Surgeon: Conrad Humboldt, MD;  Location: [place];  Service: Vascular;  Laterality: Right;  . BASCILIC VEIN TRANSPOSITION Right 12/15/2015   Procedure: SECOND STAGE Right Arm BRACHIAL VEIN TRANSPOSITION;  Surgeon: Conrad Arnoldsville, MD;  Location: [place];  Service: Vascular;  Laterality: Right;  . CATARACT EXTRACTION W/ INTRAOCULAR LENS  IMPLANT, BILATERAL    . CHOLECYSTECTOMY  1998  . COLONOSCOPY    . ENTEROSCOPY N/A 04/17/2018   Procedure: ENTEROSCOPY;  Surgeon: Wilford Corner, MD;  Location: [facility] ENDOSCOPY;  Service: Endoscopy;  Laterality: N/A;  . ESOPHAGOGASTRODUODENOSCOPY (EGD) WITH  PROPOFOL N/A 04/14/2018   Procedure: ESOPHAGOGASTRODUODENOSCOPY (EGD) WITH PROPOFOL;  Surgeon: Ronald Lobo, MD;  Location: [place];  Service: Endoscopy;  Laterality: N/A;  . FISTULA SUPERFICIALIZATION Right 08/10/2015   Procedure: Right First Stage Brachial Transposition;  Surgeon: Conrad Ivanhoe, MD;  Location: [place];  Service: Vascular;  Laterality: Right;  . GIVENS CAPSULE STUDY N/A 04/14/2018   Procedure: GIVENS CAPSULE STUDY;  Surgeon: Ronald Lobo, MD;  Location: [facility] ENDOSCOPY;  Service: Endoscopy;  Laterality: N/A;  . HOT HEMOSTASIS N/A 04/17/2018   Procedure: HOT HEMOSTASIS (ARGON PLASMA COAGULATION/BICAP);  Surgeon: Wilford Corner, MD;  Location: [place];  Service: Endoscopy;  Laterality: N/A;  . INSERTION OF DIALYSIS CATHETER N/A 08/10/2015   Procedure: INSERTION OF Right Internal Jugular DIALYSIS CATHETER;  Surgeon: Conrad Broomtown, MD;  Location: [place];  Service: Vascular;  Laterality: N/A;  . IR THORACENTESIS ASP PLEURAL SPACE W/IMG GUIDE  05/11/2017  . TONSILLECTOMY      Family History  Problem Relation Age of Onset  . Hypertension Mother   . Heart  disease Father        before age 31  . Hyperlipidemia Father   . Hypertension Father   . Heart attack Father    Social History:  reports that she has never smoked. She has never used smokeless tobacco. She reports that she does not drink alcohol or use drugs.  Allergies:  Allergies  Allergen Reactions  . Codeine Nausea And Vomiting  . Novocain [Procaine] Palpitations    Medications:                                                                                                                        I reviewed home medications   ROS:                                                                                                                                     Limited review of systems as patient is unresponsive   Examination:  General: Appears well-developed and well-nourished.  Psych: Affect appropriate to situation Eyes: No scleral injection HENT: No OP obstrucion Head: Normocephalic.  Cardiovascular: Normal rate and regular rhythm.  Respiratory: Increased effort of breathing, abdominal muscles involved GI: Soft.  No distension. There is no tenderness.  Skin: WDI    Neurological Examination Mental Status: Patient is obtunded, moans to pain and moves all 4 extremities to noxious stimulus.  Attempts to grab my hand while sternal rub.  Eyes are open, but does not track examiner. Cranial Nerves: II: Pupils are equal and reactive III,IV,VI: No forced gaze deviation V,VII: corneal reflex: present  VIII: Face appears grossly symmetric, difficult to assess as she does not follow commands IX,X: gag reflex: not tested  XII: tongue strength unable to test Motor: Localizes in both upper extremities, withdraws briskly bilateral lower extremities with at least 3/ 5 strength.  Sensory: Grimaces to pain, withdraws to noxious stimulus on both sides Deep Tendon Reflexes: Patellar  reflexes 2+ Plantars: Downgoing Cerebellar: Unable to perform     Lab Results: Basic Metabolic Panel: Recent Labs  Lab 03/12/19 0009 03/13/19 0710 03/14/19 0413 03/15/19 0446  NA 138 133* 134* 138  K 3.8 3.3* 3.7 3.9  CL 96* 93* 95* 98  CO2 $Re'30 24 22 'qfI$ 21*  GLUCOSE 109* 108* 87 87  BUN 29* 47* 18 38*  CREATININE 5.47*  5.62* 8.30* 4.95* 7.89*  CALCIUM 8.5* 8.8* 8.8* 9.0  PHOS  --  3.9  --   --     CBC: Recent Labs  Lab 03/12/19 0009 03/13/19 0710 03/14/19 0413 03/15/19 0446  WBC 8.7 7.1 6.1 6.9  NEUTROABS 7.3 5.8 4.8 5.6  HGB 10.3* 10.2* 10.8* 11.2*  HCT 32.5* 32.4* 35.1* 36.2  MCV 89.5 89.0 89.3 89.6  PLT 168 185 176 190    Coagulation Studies: No results for input(s): LABPROT, INR in the last 72 hours.  Imaging: Dg Chest 1 View  Result Date: 03/14/2019 CLINICAL DATA:  Shortness of breath. EXAM: CHEST  1 VIEW COMPARISON:  03/12/2019 FINDINGS: Stable cardiac enlargement. Consolidation within the left lower lobe appears improved. There remains a small left pleural effusion. Stable probable chronic lung disease and mild interstitial prominence which may also reflect a component of mild chronic interstitial edema. The suggestion nodular opacities seen on the prior chest x-ray is somewhat less prominent on today's chest x-ray. IMPRESSION: Improvement in left lower lobe consolidation with residual small left pleural effusion. Chronic interstitial prominence which may reflect a component of mild chronic interstitial edema. Electronically Signed   By: Aletta Edouard M.D.   On: 03/14/2019 08:02   Ct Head Wo Contrast  Result Date: 03/15/2019 CLINICAL DATA:  Seizure. EXAM: CT HEAD WITHOUT CONTRAST TECHNIQUE: Contiguous axial images were obtained from the base of the skull through the vertex without intravenous contrast. COMPARISON:  None. FINDINGS: Brain: Mild chronic ischemic white matter disease is noted. No mass effect or midline shift is noted. Ventricular size is within  normal limits. There is no evidence of mass lesion, hemorrhage or acute infarction. Vascular: No hyperdense vessel or unexpected calcification. Skull: Normal. Negative for fracture or focal lesion. Sinuses/Orbits: No acute finding. Other: None. IMPRESSION: Mild chronic ischemic white matter disease. No acute intracranial abnormality seen. Electronically Signed   By: Marijo Conception M.D.   On: 03/15/2019 09:48   Dg Abd 2 Views  Result Date: 03/14/2019 CLINICAL DATA:  Generalized abdominal pain. EXAM: ABDOMEN - 2 VIEW COMPARISON:  05/12/2017 FINDINGS: No evidence of ileus, obstruction  or free air. Clips in the right upper quadrant consistent with previous cholecystectomy. Amount of fecal matter is within normal limits. Ordinary curvature in degenerative change of the spine is seen. IMPRESSION: Negative radiography. Electronically Signed   By: Nelson Chimes M.D.   On: 03/14/2019 17:14     I have reviewed the above imaging : Head CT : no acute findings   ASSESSMENT AND PLAN   85 y.o. female with with past medical history of end-stage renal disease on dialysis, hypertension who presented to [facility] with shortness of breath, found to have lower lobe pneumonia.  She has been encephalopathic for 2 days initially thought to be related to cefepime.  She has seizures during dialysis.  Received Ativan and has been unresponsive.  Neurology was called and recommended loading with 1 g of Keppra.  A stat EEG was done to rule out nonconvulsive status epilepticus-reviewed EEG showed sharp triphasic waves-favoring severe metabolic encephalopathy rather than in NCSE.  Baseline, patient is active at home and 2 days ago was alert and oriented and able to feed herself. Patient's nurse called discuss goals of care and family wanted partial code which I think is reasonable.  I do think it is reasonable to intubate if needed  for airway protection continue to treat her seizures/encephalopathy.  Seizure Severe  toxic metabolic encephalopathy likely from cefepime/ESRD/Sepsis  Recommendations CCM to evaluate for airway protection Continue Keppra 500 mg twice daily ( renal dose) , additional 500 mg after dialysis EEG shows sharply contoured triphasics likely from severe metabolic encephalopathy, less likely NCSE - May consider repeating EEG if she does not improve Seizure precuations Frequent neurochecks MRI brain when stable if patient does not return to baseline in few days.    This patient is neurologically critically ill due to seizure r/o NCSE, severe metabolic encephalopathy with obtundation.  She is at high risk of neurological worsening from worsening seizures, as well as risk of aspiration, cardiac arrest, respiratory failure.  Care of this patient required reviewing multiple databases, monitoring vital signs, neurological exam, discussion with critical care team and hospitalist, obtain stat EEG, medication decisions.  I spent 40 minutes of critical care time with this patient     [place] Pager Number [phone]
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
## 2019-03-15 NOTE — Progress Notes (Signed)
[facility] Progress Note  Subjective: on dialysis pt was twitching and unrespnosive and having difficulty w/ secreations.  Called RR and they think pt is having seizures. Rinsed back.    Vitals:   03/15/19 0854 03/15/19 0927 03/15/19 1005 03/15/19 1154  BP: (!) 176/77  (!) 180/69 (!) 183/85  Pulse: 96 97 92 75  Resp: (!) 22 (!) 24 (!) 23 18  Temp: 98 F (36.7 C)  97.7 F (36.5 C)   TempSrc: Axillary  Oral   SpO2: 97% 98% 91% 97%  Weight:      Height:        Inpatient medications: . acidophilus  1 capsule Oral Daily  . amLODipine  5 mg Oral Daily  . aspirin EC  325 mg Oral Daily  . calcium acetate  1,334 mg Oral TID WC  . carvedilol  25 mg Oral BID WC  . Chlorhexidine Gluconate Cloth  6 each Topical Q0600  . Chlorhexidine Gluconate Cloth  6 each Topical Q0600  . diclofenac sodium  2 g Topical BID  . heparin  5,000 Units Subcutaneous Q8H  . levothyroxine  150 mcg Oral Daily  . LORazepam  1 mg Intravenous Once  . multivitamin  1 tablet Oral QHS  . pantoprazole  40 mg Oral Daily  . raloxifene  60 mg Oral Daily  . sodium chloride flush  3 mL Intravenous Q12H   . sodium chloride Stopped (03/13/19 1600)  . ampicillin-sulbactam (UNASYN) IV    . levETIRAcetam 1,000 mg (03/15/19 1032)   sodium chloride, acetaminophen, albuterol, docusate sodium, hydrALAZINE, lidocaine-prilocaine, ondansetron, polyethylene glycol, sodium chloride flush  Exam: Gen alert, fixed L upper gaze and jerking of extremities intermittently No jvd or bruits Chest clear bilat no rales or wheezing RRR no MRG Abd soft ntnd no mass or ascites +bs Ext no LE or UE edema Neuro is not responsive, eyes kind of open RUA AVF+bruit    Home meds:  - carvedilol 25 bid  - aspirin 325 qd/ sublingual ntg prn    [facility] MWF  4h 56min    350/800  53kg  2/2.25 bath  RUA AVF  P4  Hep 1200  - mircera 75 ug q2w last 4/22 - calcitriol 0.25 ug tiw - auryxia 1 tid ac   Assessment/  Plan: 1. PNA - IV abx per primary 2. ESRD: on HD MWF. HD today.  3. Anemia CKD - Hb > 10, no need esa 4. MBD ckd - cont meds 5. Confusion/ jerking - possible seizures, RR is evaluating and primary team aware.  Taken off of HD early today. Plan complete HD tomorrow.    [facility] 03/15/2019, 1:21 PM  Iron/TIBC/Ferritin/ %Sat    Component Value Date/Time   IRON 29 04/16/2018 0816   TIBC 164 (L) 04/16/2018 0816   FERRITIN 780 (H) 04/16/2018 0816   IRONPCTSAT 18 04/16/2018 0816   Recent Labs  Lab 03/13/19 0710  03/15/19 0446  NA 133*   < > 138  K 3.3*   < > 3.9  CL 93*   < > 98  CO2 24   < > 21*  GLUCOSE 108*   < > 87  BUN 47*   < > 38*  CREATININE 8.30*   < > 7.89*  CALCIUM 8.8*   < > 9.0  PHOS 3.9  --   --   ALBUMIN 2.9*   < > 3.0*   < > = values in this interval not displayed.  Recent Labs  Lab 03/15/19 0446  AST 27  ALT 29  ALKPHOS 72  BILITOT 1.2  PROT 6.0*   Recent Labs  Lab 03/15/19 0446  WBC 6.9  HGB 11.2*  HCT 36.2  PLT 190

## 2019-03-15 NOTE — Progress Notes (Signed)
Pharmacy Antibiotic Note  Adley Castello is a 85 y.o. female admitted on 02/19/2019 with pneumonia.  Pharmacy has been consulted for Unasyn dosing.  Plan: Unasyn 1.5 grams iv Q 12 hours Pharmacy to sign off  Height: $Remove'5\' 2"'ryPwxgi$  (157.5 cm) Weight: 115 lb 1.3 oz (52.2 kg) IBW/kg (Calculated) : 50.1  Temp (24hrs), Avg:97.8 F (36.6 C), Min:97.6 F (36.4 C), Max:98 F (36.7 C)  Recent Labs  Lab 03/12/19 0009 03/13/19 0710 03/14/19 0413 03/15/19 0446  WBC 8.7 7.1 6.1 6.9  CREATININE 5.47*  5.62* 8.30* 4.95* 7.89*    Estimated Creatinine Clearance: 4.1 mL/min (A) (by C-G formula based on SCr of 7.89 mg/dL (H)).    Allergies  Allergen Reactions  . Codeine Nausea And Vomiting  . Novocain [Procaine] Palpitations    Thank you for allowing pharmacy to be a part of this patient's care.  Tad Moore 03/15/2019 2:15 PM

## 2019-03-15 DEATH — deceased

## 2019-03-16 DIAGNOSIS — Z7189 Other specified counseling: Secondary | ICD-10-CM

## 2019-03-16 DIAGNOSIS — Z515 Encounter for palliative care: Secondary | ICD-10-CM

## 2019-03-16 DIAGNOSIS — R0602 Shortness of breath: Secondary | ICD-10-CM

## 2019-03-16 MED ORDER — HYDROMORPHONE HCL 1 MG/ML IJ SOLN
0.5000 mg | INTRAMUSCULAR | Status: DC | PRN
  Administered 2019-03-17: 0.5 mg via INTRAVENOUS
  Filled 2019-03-16: qty 1

## 2019-03-16 MED ORDER — IPRATROPIUM-ALBUTEROL 0.5-2.5 (3) MG/3ML IN SOLN
3.0000 mL | Freq: Three times a day (TID) | RESPIRATORY_TRACT | Status: DC
  Administered 2019-03-16: 3 mL via RESPIRATORY_TRACT
  Filled 2019-03-16: qty 3

## 2019-03-16 NOTE — Progress Notes (Signed)
PMT no charge note  Received call from the patient's daughter [name], both daughter and husband would like for the patient to be transferred to [facility] residential hospice for continuation of symptom management and end of life care.   Will request CSW assistance.   [facility] Palliative Medicine Team [phone]

## 2019-03-16 NOTE — Progress Notes (Signed)
Patient Demographics:    Alexis Washington, is a 85 y.o. female, DOB - 26-Sep-1932, EUM:[id number]  Admit date - 03/10/2019   Admitting Physician [name], MD  Outpatient Primary MD for the patient is [name], [name], MD  LOS - 5   No chief complaint on file.       Subjective:    Alexis Washington today has no fevers, no emesis, resting comfortably----  Patient is full comfort measures only now awaiting  transfer to [facility] residential hospice for continuation of symptom management and end of life care.    Assessment  & Plan :    Principal Problem:   HCAP (healthcare-associated pneumonia) Active Problems:   ESRD on dialysis ([place])   Hypertension   Acute respiratory failure with hypoxia (HCC)   Hypothyroidism   Paroxysmal atrial fibrillation with rapid ventricular response (HCC)   Dyspnea   Hypokalemia   SOB (shortness of breath)   Palliative care by specialist   Goals of care, counseling/discussion  Brief Summary:- 85 year old with past medical history significant for HTN, hypothyroidism, ESRD  on hemodialysis (MWF) who presents complaining of shortness of breath while at dialysis day of admission.... Patient is full comfort measures only now awaiting  transfer to [facility] residential hospice for continuation of symptom management and end of life care.   Plan:- 1) toxic metabolic encephalopathy/possible seizures--- neurology and pulmonary critical care consult appreciated, initially received lorazepam and Keppra, EEG with encephalopathic findings without definite epileptiform findings---  after conversations with pulmonary critical care and neurology service.... Patient family would like to pursue comfort care measures only, patient is now a DNR/DNI with no aggressive treatment protocols.... Please see documentation from pulmonary critical care attending Dr [name] and  nursing documentation---  Patient is full comfort measures only now awaiting  transfer to [facility] residential hospice for continuation of symptom management and end of life care.    2)Lt LL PNA--- COVID negative, repeat chest x-ray on 03/14/2019 showed improvement, MRSA screen negative, so   IV  Vanco were discontinued, de-escalated from IV Cefepime to Augmentin started on 03/14/2019, no fevers or leukocytosis, c/n Bronchiodilators--- after conversations with pulmonary critical care and neurology service.... Patient family would like to pursue comfort care measures only, patient is now a DNR/DNI with no aggressive treatment protocols.... Please see documentation from pulmonary critical care attending Dr [name] and nursing documentation--- Patient is full comfort measures only now awaiting  transfer to [facility] residential hospice for continuation of symptom management and end of life care.    3)Anemia of CKD--- comfort care measures only per family request  4)ESRD--previously on HD on Monday Wednesday and Friday, patient was also on Mircera, Calcitrol and Auryxia per nephrology team--- comfort care measures only per family request--- Patient is full comfort measures only now awaiting  transfer to [facility] residential hospice for continuation of symptom management and end of life care.    5)HTN--previously on amlodipine $RemoveBefor'5mg'TmOlsrnkXSFD$  daily and  Coreg 25 mg twice daily- comfort care measures only per family request  6)Hypothyroidism--comfort care measures only per family request  Disposition/Need for in-Hospital Stay- patient unable to be discharged at this time due to   worsening confusion/metabolic  encephalopathy--comfort care measures only per family request   Code Status : -DNI/DNR- Family Communication: -  -    comfort care measures only per family request,  Patient is full comfort measures only now awaiting  transfer to [facility]  residential hospice for continuation of symptom management and end of life care.   Disposition Plan  :---  Patient is full comfort measures only now awaiting  transfer to [facility] residential hospice for continuation of symptom management and end of life care.   Consults  :  Nephrologist/Pulmonary critical care , palliative care and neurology  DVT Prophylaxis  :   -Comfort care  Lab Results  Component Value Date   PLT 190 03/15/2019    Inpatient Medications  Scheduled Meds: . ipratropium-albuterol  3 mL Nebulization TID  . sodium chloride flush  3 mL Intravenous Q12H   Continuous Infusions: . sodium chloride     PRN Meds:.sodium chloride, albuterol, bisacodyl, glycopyrrolate **OR** glycopyrrolate **OR** glycopyrrolate, HYDROmorphone (DILAUDID) injection, LORazepam **OR** [DISCONTINUED] LORazepam **OR** LORazepam, ondansetron **OR** ondansetron (ZOFRAN) IV, sodium chloride flush    Anti-infectives (From admission, onward)   Start     Dose/Rate Route Frequency Ordered Stop   03/15/19 1230  ampicillin-sulbactam (UNASYN) 1.5 g in sodium chloride 0.9 % 100 mL IVPB  Status:  Discontinued     1.5 g 200 mL/hr over 30 Minutes Intravenous Every 12 hours 03/15/19 1224 03/15/19 1618   03/14/19 2000  amoxicillin-clavulanate (AUGMENTIN) 500-125 MG per tablet 500 mg  Status:  Discontinued     500 mg Oral Daily 03/14/19 1125 03/15/19 1204   03/13/19 2000  ceFEPIme (MAXIPIME) 2 g in sodium chloride 0.9 % 100 mL IVPB  Status:  Discontinued     2 g 200 mL/hr over 30 Minutes Intravenous Every M-W-F (2000) 03/12/19 0017 03/14/19 1125   03/13/19 1200  vancomycin (VANCOCIN) IVPB 500 mg/100 ml premix  Status:  Discontinued     500 mg 100 mL/hr over 60 Minutes Intravenous Every M-W-F (Hemodialysis) 03/12/19 0017 03/14/19 1536   03/12/19 0030  ceFEPIme (MAXIPIME) 2 g in sodium chloride 0.9 % 100 mL IVPB     2 g 200 mL/hr over 30 Minutes Intravenous  Once 03/12/19 0017 03/12/19 0245    03/12/19 0015  vancomycin (VANCOCIN) IVPB 1000 mg/200 mL premix     1,000 mg 200 mL/hr over 60 Minutes Intravenous  Once 03/12/19 0017 03/12/19 0412        Objective:   Vitals:   03/15/19 1619 03/15/19 2118 03/16/19 0805 03/16/19 1339  BP:      Pulse: 82     Resp: (!) 28     Temp:      TempSrc:      SpO2: 98% 96% 94% 96%  Weight:      Height:        Wt Readings from Last 3 Encounters:  03/15/19 52.2 kg  01/03/19 52.8 kg  11/01/18 54.5 kg    No intake or output data in the 24 hours ending 03/16/19 1659  Physical Exam Patient is examined daily including today on 03/16/19 , exams remain the same as of yesterday except that has changed   Gen:-  resting comfortably ,  no acute distress HEENT:- [place].AT, No sclera icterus Neck-Supple Neck,No JVD,.  Lungs-diminished in bases no wheezing  CV- S1, S2 normal, regular  Abd-  +ve B.Sounds, Abd Soft, No tenderness,  Extremity/Skin:-  pedal pulses present, RUA AV fistula with positive thrill and  bruit Psych-lethargic  neuro-no new focal deficits, no further seizure type activity  Data Review:   Micro Results Recent Results (from the past 240 hour(s))  SARS Coronavirus 2 [facility] order, Performed in [place] hospital lab)     Status: None   Collection Time: 02/20/2019 11:56 PM  Result Value Ref Range Status   SARS Coronavirus 2 NEGATIVE NEGATIVE Final    Comment: (NOTE) If result is NEGATIVE SARS-CoV-2 target nucleic acids are NOT DETECTED. The SARS-CoV-2 RNA is generally detectable in upper and lower  respiratory specimens during the acute phase of infection. The lowest  concentration of SARS-CoV-2 viral copies this assay can detect is 250  copies / mL. A negative result does not preclude SARS-CoV-2 infection  and should not be used as the sole basis for treatment or other  patient management decisions.  A negative result may occur with  improper specimen collection / handling, submission of specimen other  than  nasopharyngeal swab, presence of viral mutation(s) within the  areas targeted by this assay, and inadequate number of viral copies  (<250 copies / mL). A negative result must be combined with clinical  observations, patient history, and epidemiological information. If result is POSITIVE SARS-CoV-2 target nucleic acids are DETECTED. The SARS-CoV-2 RNA is generally detectable in upper and lower  respiratory specimens dur ing the acute phase of infection.  Positive  results are indicative of active infection with SARS-CoV-2.  Clinical  correlation with patient history and other diagnostic information is  necessary to determine patient infection status.  Positive results do  not rule out bacterial infection or co-infection with other viruses. If result is PRESUMPTIVE POSTIVE SARS-CoV-2 nucleic acids MAY BE PRESENT.   A presumptive positive result was obtained on the submitted specimen  and confirmed on repeat testing.  While 2019 novel coronavirus  (SARS-CoV-2) nucleic acids may be present in the submitted sample  additional confirmatory testing may be necessary for epidemiological  and / or clinical management purposes  to differentiate between  SARS-CoV-2 and other Sarbecovirus currently known to infect humans.  If clinically indicated additional testing with an alternate test  methodology [phone]) is advised. The SARS-CoV-2 RNA is generally  detectable in upper and lower respiratory sp ecimens during the acute  phase of infection. The expected result is Negative. Fact Sheet for Patients:  StrictlyIdeas.no Fact Sheet for Healthcare Providers: BankingDealers.co.za This test is not yet approved or cleared by the [place] FDA and has been authorized for detection and/or diagnosis of SARS-CoV-2 by FDA under an Emergency Use Authorization (EUA).  This EUA will remain in effect (meaning this test can be used) for the duration of the  COVID-19 declaration under Section 564(b)(1) of the Act, 21 U.S.C. section 360bbb-3(b)(1), unless the authorization is terminated or revoked sooner. Performed at [facility], [place] [address], [place], [place] [address]   MRSA PCR Screening     Status: None   Collection Time: 03/13/19  5:21 PM  Result Value Ref Range Status   MRSA by PCR NEGATIVE NEGATIVE Final    Comment:        The GeneXpert MRSA Assay (FDA approved for NASAL specimens only), is one component of a comprehensive MRSA colonization surveillance program. It is not intended to diagnose MRSA infection nor to guide or monitor treatment for MRSA infections. Performed at [facility], [place] [address] [place], [place] [address]     Radiology Reports Dg Chest 1 View  Result Date: 03/14/2019 CLINICAL DATA:  Shortness of breath. EXAM: CHEST  1 VIEW COMPARISON:  03/12/2019 FINDINGS: Stable cardiac enlargement. Consolidation within the left lower lobe appears improved. There remains a small left pleural effusion. Stable probable chronic lung disease and mild interstitial prominence which may also reflect a component of mild chronic interstitial edema. The suggestion nodular opacities seen on the prior chest x-ray is somewhat less prominent on today's chest x-ray. IMPRESSION: Improvement in left lower lobe consolidation with residual small left pleural effusion. Chronic interstitial prominence which may reflect a component of mild chronic interstitial edema. Electronically Signed   By: Aletta Edouard M.D.   On: 03/14/2019 08:02   Ct Head Wo Contrast  Result Date: 03/15/2019 CLINICAL DATA:  Seizure. EXAM: CT HEAD WITHOUT CONTRAST TECHNIQUE: Contiguous axial images were obtained from the base of the skull through the vertex without intravenous contrast. COMPARISON:  None. FINDINGS: Brain: Mild chronic ischemic white matter disease is noted. No mass effect or midline shift is noted. Ventricular size is within normal limits.  There is no evidence of mass lesion, hemorrhage or acute infarction. Vascular: No hyperdense vessel or unexpected calcification. Skull: Normal. Negative for fracture or focal lesion. Sinuses/Orbits: No acute finding. Other: None. IMPRESSION: Mild chronic ischemic white matter disease. No acute intracranial abnormality seen. Electronically Signed   By: Marijo Conception M.D.   On: 03/15/2019 09:48   Dg Chest Port 1 View  Result Date: 03/12/2019 CLINICAL DATA:  Hypoxia, shortness of breath, cough and end-stage renal disease. EXAM: PORTABLE CHEST 1 VIEW COMPARISON:  04/13/2018 FINDINGS: Stable cardiac enlargement. Chronic small left pleural effusion. Additional opacity in the left lower lung may represent atelectasis or pneumonia. Suggestion of potential mild pulmonary interstitial edema. There also is suggestion of potentially subtle nodular opacities bilaterally in the right upper lobe, right lower perihilar region and left upper perihilar region. Subtle pulmonary nodules are not excluded and follow-up recommended. IMPRESSION: 1. Chronic small left pleural effusion with additional left lower lobe opacity suggestive of atelectasis or pneumonia. 2. Potential mild pulmonary interstitial edema. 3. Suggestion of potential subtle nodular opacities bilaterally. Pulmonary nodules are not excluded and follow-up PA and lateral chest x-ray is recommended. Electronically Signed   By: Aletta Edouard M.D.   On: 03/12/2019 09:09   Dg Abd 2 Views  Result Date: 03/14/2019 CLINICAL DATA:  Generalized abdominal pain. EXAM: ABDOMEN - 2 VIEW COMPARISON:  05/12/2017 FINDINGS: No evidence of ileus, obstruction or free air. Clips in the right upper quadrant consistent with previous cholecystectomy. Amount of fecal matter is within normal limits. Ordinary curvature in degenerative change of the spine is seen. IMPRESSION: Negative radiography. Electronically Signed   By: Nelson Chimes M.D.   On: 03/14/2019 17:14     CBC Recent Labs   Lab 03/12/19 0009 03/13/19 0710 03/14/19 0413 03/15/19 0446  WBC 8.7 7.1 6.1 6.9  HGB 10.3* 10.2* 10.8* 11.2*  HCT 32.5* 32.4* 35.1* 36.2  PLT 168 185 176 190  MCV 89.5 89.0 89.3 89.6  MCH 28.4 28.0 27.5 27.7  MCHC 31.7 31.5 30.8 30.9  RDW 16.1* 15.9* 15.7* 16.0*  LYMPHSABS 0.8 0.6* 0.6* 0.6*  MONOABS 0.5 0.5 0.6 0.6  EOSABS 0.1 0.1 0.1 0.0  BASOSABS 0.0 0.0 0.0 0.0    Chemistries  Recent Labs  Lab 03/12/19 0009 03/13/19 0710 03/14/19 0413 03/15/19 0446  NA 138 133* 134* 138  K 3.8 3.3* 3.7 3.9  CL 96* 93* 95* 98  CO2 $Re'30 24 22 'mZR$ 21*  GLUCOSE 109* 108* 87 87  BUN 29* 47* 18 38*  CREATININE 5.47*  5.62* 8.30* 4.95* 7.89*  CALCIUM 8.5* 8.8* 8.8* 9.0  AST 60* 27 32 27  ALT 48* $Remov'31 30 29  'pQVnjj$ ALKPHOS 83 73 70 72  BILITOT 0.5 0.5 0.9 1.2   ------------------------------------------------------------------------------------------------------------------ No results for input(s): CHOL, HDL, LDLCALC, TRIG, CHOLHDL, LDLDIRECT in the last 72 hours.  Lab Results  Component Value Date   HGBA1C 4.3 (L) 01/03/2019   ------------------------------------------------------------------------------------------------------------------ No results for input(s): TSH, T4TOTAL, T3FREE, THYROIDAB in the last 72 hours.  Invalid input(s): FREET3 ------------------------------------------------------------------------------------------------------------------ No results for input(s): VITAMINB12, FOLATE, FERRITIN, TIBC, IRON, RETICCTPCT in the last 72 hours.  Coagulation profile No results for input(s): INR, PROTIME in the last 168 hours.  No results for input(s): DDIMER in the last 72 hours.  Cardiac Enzymes No results for input(s): CKMB, TROPONINI, MYOGLOBIN in the last 168 hours.  Invalid input(s): CK ------------------------------------------------------------------------------------------------------------------    Component Value Date/Time   BNP 2,422.4 (H) 11/08/2017 7195     Roxan Hockey M.D on 03/16/2019 at 4:59 PM  Go to [email or web address] - for contact info  [facility] - Office  [phone]

## 2019-03-16 NOTE — Progress Notes (Signed)
   03/16/19 2117  Clinical Encounter Type  Visited With Patient  Visit Type Follow-up;Spiritual support  Referral From Nurse  Consult/Referral To Chaplain  Spiritual Encounters  Spiritual Needs Prayer  This chaplain responded to consult for Pt. and family spiritual care. The chaplain read the chart and spoke to the Pt. RN-[place].  At the time of the visit, family was not with the Pt.  The chaplain prayed with the Pt. in the room.  The chaplain is available for F/U spiritual care as needed.

## 2019-03-16 NOTE — Consult Note (Signed)
Consultation Note Date: 03/16/2019   Patient Name: Alexis Washington  DOB: 1932-01-28  MRN: [id number]  Age / Sex: 85 y.o., female  PCP: Rochel Brome, MD Referring Physician: Roxan Hockey, MD  Reason for Consultation: Establishing goals of care  HPI/Patient Profile: 85 y.o. female   admitted on 03/10/2019    Clinical Assessment and Goals of Care:  85 yo lady who has ESRD and has been on dialysis for about 3 years now, she recently had a stroke, she was sent to the hospital due to dyspnea, possible PNA. It is suspected that while in HD, the patient suffered seizures. She had decreased level of consciousness. Patient was placed on BIPAP, seen by PCCM. Renal colleagues have also been following, she remains admitted to hospital medicine. The patient's family engaged in goals of care discussions with PCCM and other hospital staff, elected for continuation of non invasive ventilatory support and established DNR DNI.   A palliative consult has been requested for ongoing goals of care discussions and for appropriate disposition planning.   The patient is resting in bed, she is supposed to have NRB mask on, she appears with labored breathing, she is essentially unresponsive, does not arouse to voice commands, does not follow commands or verbalize.   Call placed and discussed with daughter Ms furr at [phone]. I introduced myself and palliative care as follows: Palliative medicine is specialized medical care for people living with serious illness. It focuses on providing relief from the symptoms and stress of a serious illness. The goal is to improve quality of life for both the patient and the family.  Ms Alexis Washington is thankful for the information she has received from hospital staff. I updated her on the patient's current condition. Goals, wishes and values important to the patient were discussed. I discussed with her about  appropriate comfort care measures being employed currently.   I discussed with Ms Alexis Washington about residential hospice and full scope of comfort measures. Specifically, we talked about d/c of dialysis and she is agreeable.   See below.   NEXT OF KIN  husband and daughter.   SUMMARY OF RECOMMENDATIONS    DNR DNI No more dialysis Focus to shift towards comfort measures Discussed with daughter on the phone in detail about the type of care that can be provided in a residential hospice, she wishes to discuss further with her father, the patient's husband. I have given her our palliative contact information.  Code Status/Advance Care Planning:  DNR    Symptom Management:    D/C Morphine  Add Dilaudid IV PRN  Continue other comfort measures: patient already on PRN IV Ativan, Robinul, Haldol etc.   Palliative Prophylaxis:   Delirium Protocol  Additional Recommendations (Limitations, Scope, Preferences):  Full Comfort Care  Psycho-social/Spiritual:   Desire for further Chaplaincy support:yes  Additional Recommendations: Education on Hospice  Prognosis:   < 2 weeks  Discharge Planning: likely hospice facility Daughter wishes to discuss further with the patient's husband of 49 years, she will call  us back with decision.     Primary Diagnoses: Present on Admission: . Hypertension . Hypothyroidism . HCAP (healthcare-associated pneumonia) . Acute respiratory failure with hypoxia ([place]) . Paroxysmal atrial fibrillation with rapid ventricular response ([place])   I have reviewed the medical record, interviewed the patient and family, and examined the patient. The following aspects are pertinent.  Past Medical History:  Diagnosis Date  . Anemia in chronic renal disease    ESRD- Sees Dr Justin Mend  . Arthritis   . Benign lipomatous neoplasm of kidney   . Complication of anesthesia 1998   "blood pressure dropped low during surgery and after surgery it went up really high."   .  Diverticulitis   . ESRD (end stage renal disease) on dialysis [facility])    "MWF; [place]" (11/08/2017)  . GERD (gastroesophageal reflux disease)   . Heart murmur    PCP- said it is nothing to be concerned  . HOH (hard of hearing)   . Hyperkalemia   . Hyperphosphatemia   . Hypertension   . Hypertensive kidney disease   . Hypothyroidism   . Nail-patella syndrome   . PONV (postoperative nausea and vomiting)   . Proteinuria   . Shortness of breath dyspnea    Social History   Socioeconomic History  . Marital status: Married    Spouse name: Not on file  . Number of children: Not on file  . Years of education: Not on file  . Highest education level: Not on file  Occupational History  . Not on file  Social Needs  . Financial resource strain: Not on file  . Food insecurity:    Worry: Not on file    Inability: Not on file  . Transportation needs:    Medical: Not on file    Non-medical: Not on file  Tobacco Use  . Smoking status: Never Smoker  . Smokeless tobacco: Never Used  Substance and Sexual Activity  . Alcohol use: No    Alcohol/week: 0.0 standard drinks  . Drug use: No  . Sexual activity: Not on file  Lifestyle  . Physical activity:    Days per week: Not on file    Minutes per session: Not on file  . Stress: Not on file  Relationships  . Social connections:    Talks on phone: Not on file    Gets together: Not on file    Attends religious service: Not on file    Active member of club or organization: Not on file    Attends meetings of clubs or organizations: Not on file    Relationship status: Not on file  Other Topics Concern  . Not on file  Social History Narrative  . Not on file   Family History  Problem Relation Age of Onset  . Hypertension Mother   . Heart disease Father        before age 53  . Hyperlipidemia Father   . Hypertension Father   . Heart attack Father    Scheduled Meds: . ipratropium-albuterol  3 mL Nebulization Q6H  .  sodium chloride flush  3 mL Intravenous Q12H   Continuous Infusions: . sodium chloride     PRN Meds:.sodium chloride, albuterol, bisacodyl, glycopyrrolate **OR** glycopyrrolate **OR** glycopyrrolate, LORazepam **OR** [DISCONTINUED] LORazepam **OR** LORazepam, morphine injection, ondansetron **OR** ondansetron (ZOFRAN) IV, sodium chloride flush Medications Prior to Admission:  Prior to Admission medications   Medication Sig Start Date End Date Taking? Authorizing Provider  acetaminophen (TYLENOL) 500 MG tablet  Take 500 mg by mouth every 8 (eight) hours as needed for mild pain.   Yes [provider]  aspirin EC 325 MG tablet Take 1 tablet (325 mg total) by mouth daily. Start on 02/05/2019 Patient taking differently: Take 325 mg by mouth at bedtime.  02/05/19  Yes Mariel Aloe, MD  atorvastatin (LIPITOR) 10 MG tablet Take 10 mg by mouth at bedtime. 01/17/19  Yes [provider]  calcium acetate (PHOSLO) 667 MG capsule Take 1,334 mg by mouth 3 (three) times daily.  04/21/17  Yes [provider]  carvedilol (COREG) 25 MG tablet Take 1 tablet (25 mg total) by mouth 2 (two) times daily. 11/01/18 03/13/19 Yes Revankar, Reita Cliche, MD  Chlorpheniramine-Acetaminophen (CORICIDIN HBP COLD/FLU PO) Take 1 tablet by mouth as needed (cold symptoms).   Yes [provider]  docusate sodium (COLACE) 100 MG capsule Take 1 capsule (100 mg total) by mouth 2 (two) times daily as needed for mild constipation. 05/12/17  Yes Amin, Ankit Chirag, MD  fluticasone (FLONASE) 50 MCG/ACT nasal spray Place 1 spray into both nostrils as needed for allergies or rhinitis.   Yes [provider]  folic acid-vitamin b complex-vitamin c-selenium-zinc (DIALYVITE) 3 MG TABS tablet Take 1 tablet by mouth daily.   Yes [provider]  levothyroxine (SYNTHROID, LEVOTHROID) 150 MCG tablet Take 150 mcg by mouth daily.  08/28/17  Yes [provider]  Lidocaine-Prilocaine, Bulk, 2.5-2.5 %  CREA Apply 1 application topically as needed (before dialysis).   Yes [provider]  nitroGLYCERIN (NITROSTAT) 0.4 MG SL tablet Place 1 tablet (0.4 mg total) under the tongue every 5 (five) minutes as needed. 11/01/18 03/13/19 Yes Revankar, Reita Cliche, MD  omeprazole (PRILOSEC) 40 MG capsule Take 40 mg by mouth daily.   Yes [provider]  OVER THE COUNTER MEDICATION Place 1 drop into both eyes daily as needed (allergy symptoms). Allergy Relief eye drops   Yes [provider]  polyethylene glycol (MIRALAX / GLYCOLAX) packet Take 17 g by mouth daily as needed. Patient taking differently: Take 17 g by mouth daily as needed for mild constipation.  05/12/17  Yes Amin, Jeanella Flattery, MD  Probiotic Product (DIGESTIVE ADVANTAGE) CAPS Take 1 capsule by mouth daily.   Yes [provider]  raloxifene (EVISTA) 60 MG tablet Take 60 mg by mouth daily. 04/24/17  Yes [provider]   Allergies  Allergen Reactions  . Codeine Nausea And Vomiting  . Novocain [Procaine] Palpitations   Review of Systems Unresponsive  Physical Exam Frail lady Pale appearing No edema Unresponsive On NRB mask Coarse breath sounds Grunts and moans occasionally ?contractures S1 S2   Vital Signs: BP (!) 85/49   Pulse 82   Temp 97.7 F (36.5 C) (Oral)   Resp (!) 28   Ht $R'5\' 2"'pj$  (1.575 m)   Wt 52.2 kg   SpO2 94%   BMI 21.05 kg/m  Pain Scale: FLACC POSS *See Group Information*: S-Acceptable,Sleep, easy to arouse Pain Score: 0-No pain   SpO2: SpO2: 94 % O2 Device:SpO2: 94 % O2 Flow Rate: .O2 Flow Rate (L/min): 4 L/min  IO: Intake/output summary: No intake or output data in the 24 hours ending 03/16/19 1200  LBM: Last BM Date: 03/14/19 Baseline Weight: Weight: 54.2 kg Most recent weight: Weight: 52.2 kg     Palliative Assessment/Data:   Flowsheet Rows     Most Recent Value  Intake Tab  Referral Department  Hospitalist  Unit at Time of Referral  Med/Surg Unit   Palliative Care Primary Diagnosis  Neurology  Palliative Care Type  New Palliative care  Reason for referral  Clarify Goals of Care  Date first seen by Palliative Care  03/16/19  Clinical Assessment  Palliative Performance Scale Score  10%  Pain Max last 24 hours  4  Pain Min Last 24 hours  3  Dyspnea Max Last 24 Hours  4  Dyspnea Min Last 24 hours  3  Nausea Max Last 24 Hours  3  Nausea Min Last 24 Hours  2  Anxiety Max Last 24 Hours  6  Anxiety Min Last 24 Hours  5  Psychosocial & Spiritual Assessment  Palliative Care Outcomes  Patient/Family meeting held?  Yes  Who was at the meeting?  daughter Ms Alexis Washington on the phone       Time In:  46 Time Out:  12.10 Time Total:  70 Greater than 50%  of this time was spent counseling and coordinating care related to the above assessment and plan.  Signed by: Loistine Chance, MD  [phone] Please contact Palliative Medicine Team phone at [phone] for questions and concerns.  For individual provider: See Shea Evans

## 2019-03-16 NOTE — Progress Notes (Signed)
Spoke with Yetta Glassman from [place]. She is unable to get pt transferred until tomorrow. [phone]

## 2019-03-16 NOTE — Progress Notes (Signed)
Pt is now DNR and comfort care.  Appreciate care of CCM and [facility] and neurology. No further dialysis. Will sign off.  [facility] 03/16/2019, 9:43 AM

## 2019-04-15 NOTE — Progress Notes (Signed)
Patient started modelling with apnea and cheyne stokes breathing. She was given Dilaudid 0.5 mg PRN. Called the husband and left  messages to him to call back for update. I was able to get in touch of her daughter and gave update. Patient passed peacefully and was pronounced dead at 0305.

## 2019-04-15 NOTE — Discharge Summary (Signed)
Alexis Washington WSF:[id number] DOB: 1932-06-30 DOA: 03-16-19  PCP: Rochel Brome, MD PCP/Office notified:   Admit date: 16-Mar-2019 Date of Death: Mar 22, 2019  Final Diagnoses:  Principal Problem:   HCAP (healthcare-associated pneumonia) Active Problems:   ESRD on dialysis ([place])   Hypertension   Acute respiratory failure with hypoxia ([place])   Hypothyroidism   Paroxysmal atrial fibrillation with rapid ventricular response ([place])   Dyspnea   Hypokalemia   SOB (shortness of breath)   Palliative care by specialist   Goals of care, counseling/discussion  History of present illness:   Alexis Washington  is a 85 y.o. female, w hypertension, hypothyroidism, ESRD on HD (M,W,F), c/o dyspnea while at dialysis today. Pt states she had her regular amount of fluid taken off.  Pt notes dry cough. N/v x1.   Pt denies fever, chills, cp, palp, orthopnea, pnd, abd pain, diarrhea, brbpr, alteration in sense of smell or taste, dysuria.  In ED T 101.0 P 98  R 22 Bp 221/ 91  Pox 100% on 2L [place]  Abg PH 7.550  pCo2 37, po2 64  Na 135, K 3.4,  Bun 23, Creatinne 4.10   Glucose 112 Calcium 8.1,   Ast 48 (high) Alt 34 Alk phos 85, T. Bili 0.5 LDH 548, CPK 70  Trop  <0.01 BNP 23,500 (0-1800)  Wbc  7.7, Hgb 11.1, Plt 180 LYmph 3.7 % low  Ferritin 1060   Covid negative  CXR  Impression: Mild hazy opacification over the left base slightly worse likely small effusion with atelectasis infection in the left base is possible Mild stable cardiomegaly  Ekg nsr at 94, nl axis, nl int, no st-t changes c/w ischemia  Pt received zithroma 535m po x1, Rocephin 1gm iv x1 Labetalol 254miv x1, enalpril 1.2577mv x1  Pt will be admitted for dyspnea secondary to hypertensive urgency and Hcap  Hospital Course:  Brief Summary:- [age]ar old with past medical history significant for HTN, hypothyroidism, ESRD  on hemodialysis (MWF) who presents complaining of shortness of breath while at dialysis day of  admission.... after conversations with pulmonary critical care and neurology service.... Patient family would like to pursue comfort care measures only, patient is now a DNR/DNI with no aggressive treatment protocols..  Plan:- 1) toxic metabolic encephalopathy/possible seizures--- neurology and pulmonary critical care consult appreciated, initially received lorazepam and Keppra, EEG with encephalopathic findings without definite epileptiform findings---  after conversations with pulmonary critical care and neurology service.... Patient family would like to pursue comfort care measures only, patient is now a DNR/DNI with no aggressive treatment protocols.... Please see documentation from pulmonary critical care attending Dr SimAlva Garnetd nursing documentation---     2)Lt LL PNA--- COVID negative, repeat chest x-ray on 03/14/2019 showed improvement, MRSA screen negative, so   IV  Vanco were discontinued, de-escalated from IV Cefepime to Augmentin started on 03/14/2019, no fevers or leukocytosis, c/n Bronchiodilators--- after conversations with pulmonary critical care and neurology service.... Patient family would like to pursue comfort care measures only, patient is now a DNR/DNI with no aggressive treatment protocols.... Please see documentation from pulmonary critical care attending Dr SimAlva Garnetd nursing documentation---  3)Anemia of CKD--- comfort care measures only per family request  4)ESRD--previously on HD on Monday Wednesday and Friday, patient was also on Mircera, Calcitrol and Auryxia per nephrology team--- comfort care measures only per family request--- Patient is full comfort measures only   5)HTN--previously on amlodipine 5mg58mily and  Coreg 25 mg twice daily- comfort care measures only  per family request  6)Hypothyroidism--comfort care measures only per family request  NB!!! after conversations with pulmonary critical care and neurology service.... Patient family would like to  pursue comfort care measures only, patient is now a DNR/DNI with no aggressive treatment protocols..  Code Status : -DNI/DNR- Family Communication: -  -    comfort care measures only per family request,     Consults  :  Nephrologist/Pulmonary critical care , palliative care and neurology  Time: Time of Death ... 18-Mar-2019 at 0305 am  Signed:  Gwynn Chalker  [facility] 03/18/2019, 8:46 AM

## 2019-04-15 DEATH — deceased

## 2019-07-10 IMAGING — CR DG CHEST 2V
2 series · 2 of 2 positions shown · non-contrast
Comparison: 11/09/2017

CLINICAL DATA: Productive cough, shortness of breath

EXAM:
CHEST - 2 VIEW

[chest lat]
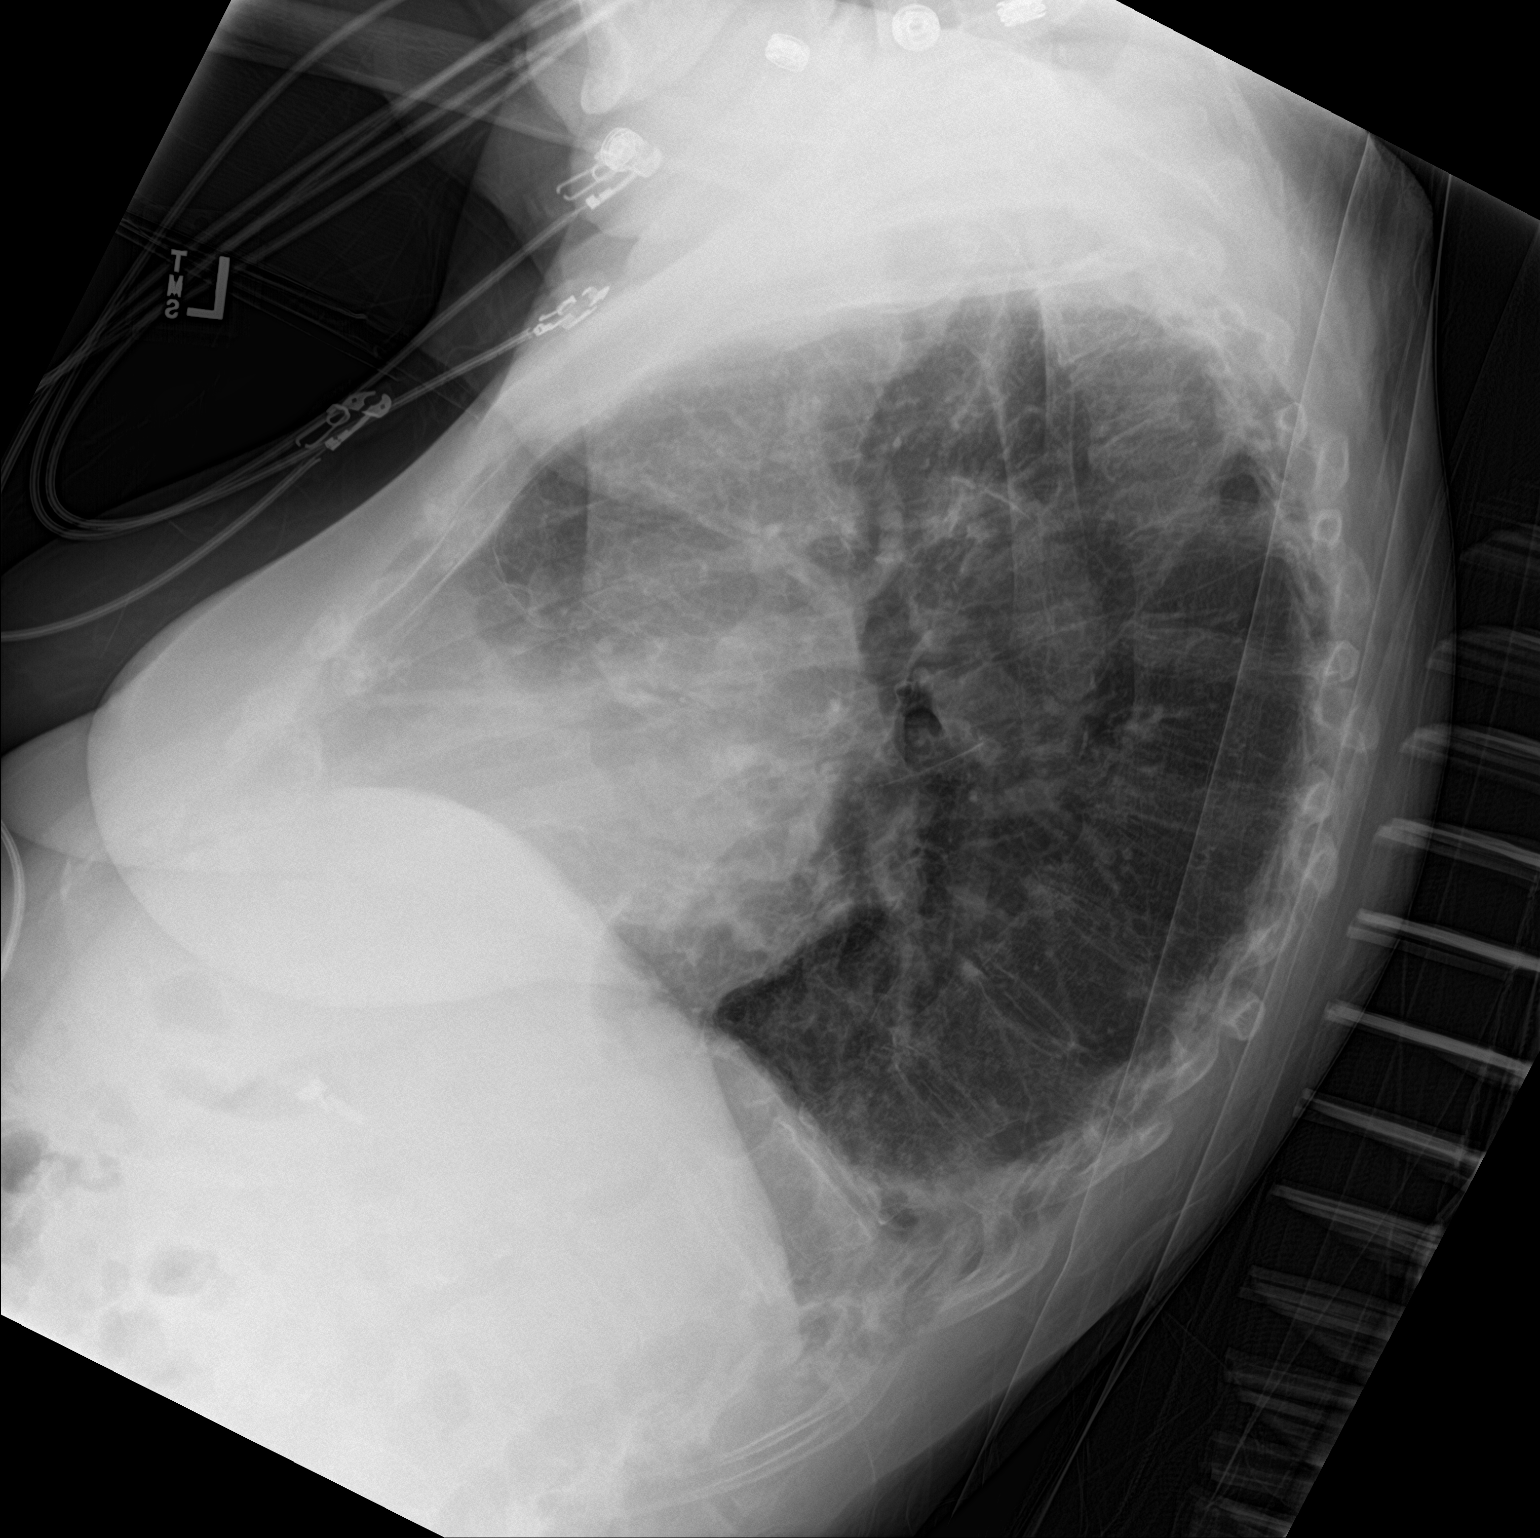

[chest ap]
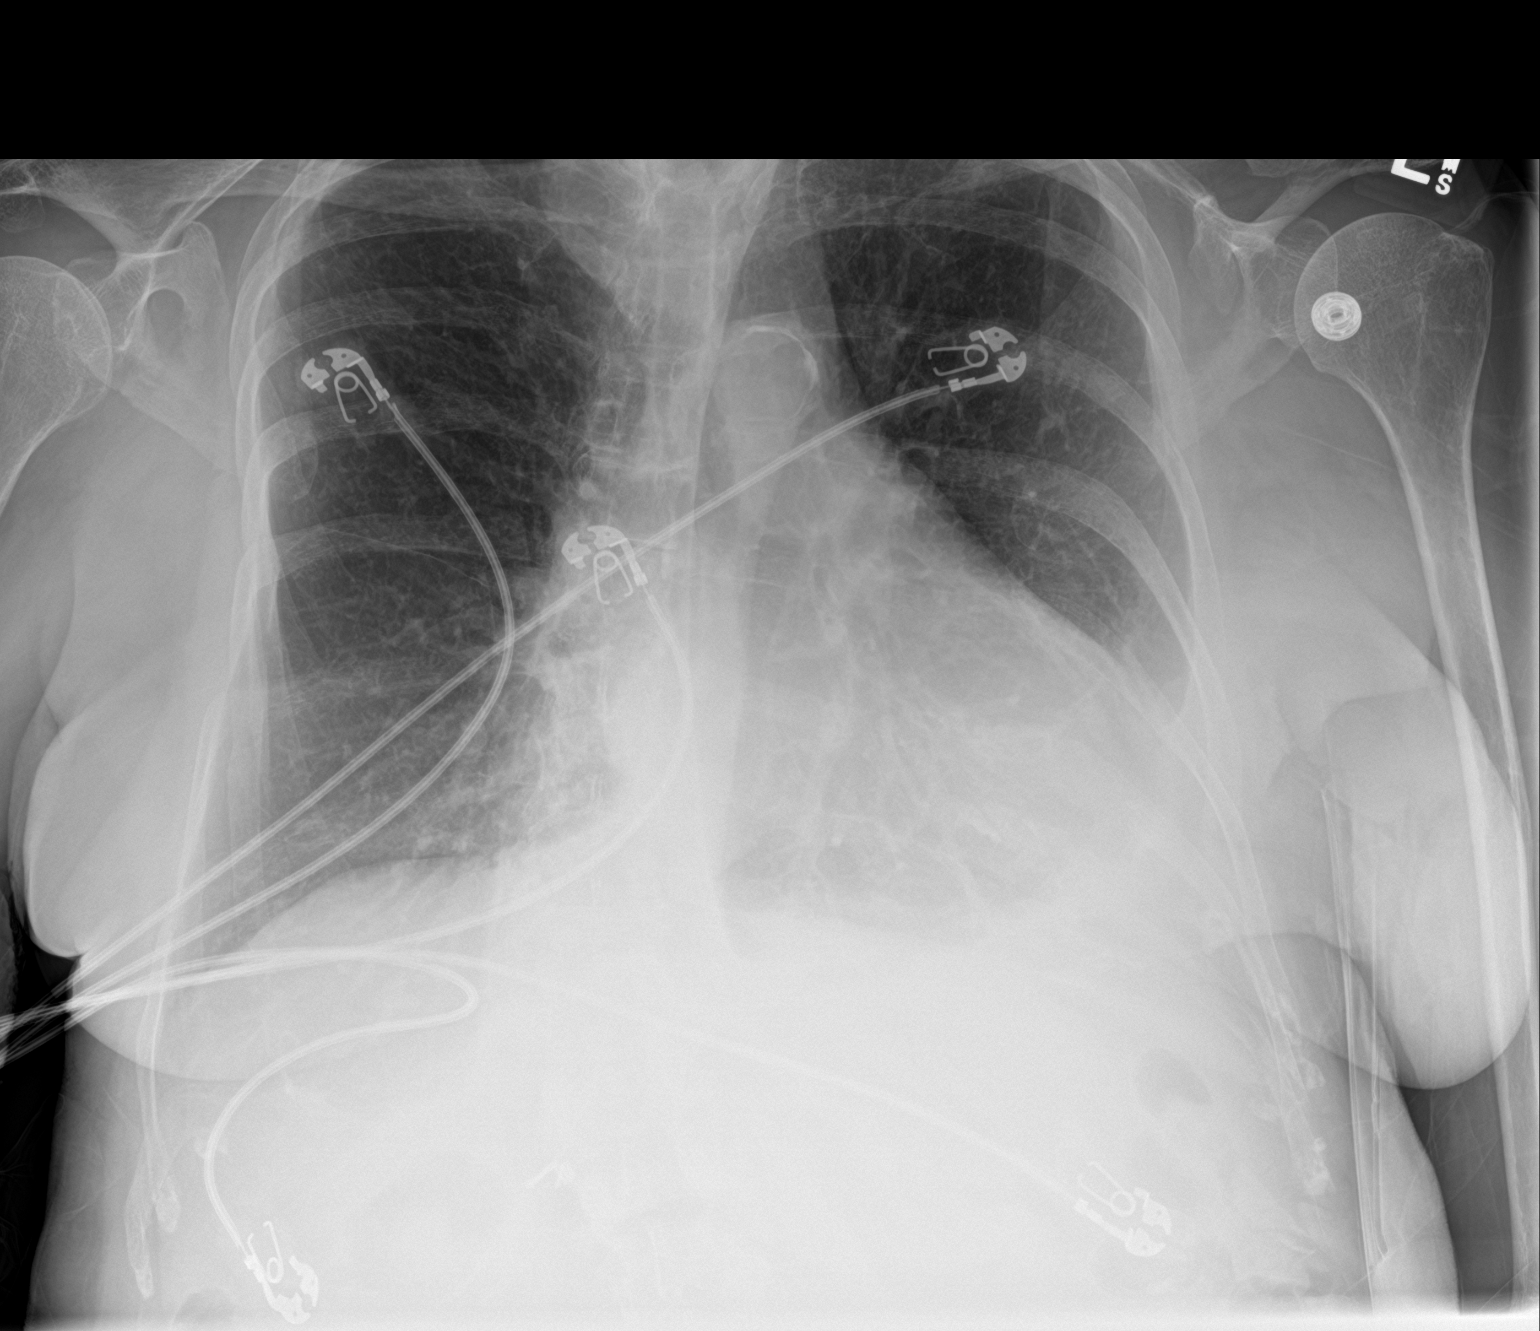

[2 of 2 positions shown; findings below may reference images not displayed]

FINDINGS: Cardiomegaly. Small left pleural effusion, decreased since prior
study. Left lower lobe atelectasis, also improved. No confluent
opacity on the right. No acute bony abnormality.
IMPRESSION: Small left pleural effusion and left base atelectasis, both
improving since prior study.

Stable cardiomegaly.

## 2019-08-22 ENCOUNTER — Ambulatory Visit: Payer: PPO | Admitting: Adult Health
